# Patient Record
Sex: Male | Born: 1965 | Race: White | Hispanic: No | Smoking: Current every day smoker
Health system: Southern US, Community
[De-identification: ages and names within clinical notes are randomized; demographics above are authoritative.]

## PROBLEM LIST (undated history)

## (undated) DIAGNOSIS — F410 Panic disorder [episodic paroxysmal anxiety] without agoraphobia: Secondary | ICD-10-CM

## (undated) DIAGNOSIS — M5430 Sciatica, unspecified side: Secondary | ICD-10-CM

## (undated) DIAGNOSIS — N400 Enlarged prostate without lower urinary tract symptoms: Secondary | ICD-10-CM

## (undated) DIAGNOSIS — F419 Anxiety disorder, unspecified: Secondary | ICD-10-CM

## (undated) DIAGNOSIS — F32A Depression, unspecified: Secondary | ICD-10-CM

## (undated) DIAGNOSIS — Z8719 Personal history of other diseases of the digestive system: Secondary | ICD-10-CM

## (undated) DIAGNOSIS — F1010 Alcohol abuse, uncomplicated: Secondary | ICD-10-CM

## (undated) DIAGNOSIS — M1990 Unspecified osteoarthritis, unspecified site: Secondary | ICD-10-CM

## (undated) DIAGNOSIS — F329 Major depressive disorder, single episode, unspecified: Secondary | ICD-10-CM

## (undated) DIAGNOSIS — M7711 Lateral epicondylitis, right elbow: Secondary | ICD-10-CM

## (undated) DIAGNOSIS — I10 Essential (primary) hypertension: Secondary | ICD-10-CM

## (undated) DIAGNOSIS — R42 Dizziness and giddiness: Secondary | ICD-10-CM

## (undated) DIAGNOSIS — R569 Unspecified convulsions: Secondary | ICD-10-CM

## (undated) DIAGNOSIS — G8929 Other chronic pain: Secondary | ICD-10-CM

## (undated) DIAGNOSIS — R51 Headache: Secondary | ICD-10-CM

## (undated) DIAGNOSIS — G5601 Carpal tunnel syndrome, right upper limb: Secondary | ICD-10-CM

## (undated) DIAGNOSIS — R519 Headache, unspecified: Secondary | ICD-10-CM

## (undated) DIAGNOSIS — M549 Dorsalgia, unspecified: Secondary | ICD-10-CM

## (undated) HISTORY — DX: Personal history of other diseases of the digestive system: Z87.19

## (undated) HISTORY — DX: Carpal tunnel syndrome, right upper limb: G56.01

## (undated) HISTORY — PX: KIDNEY STONE SURGERY: SHX686

## (undated) HISTORY — DX: Benign prostatic hyperplasia without lower urinary tract symptoms: N40.0

## (undated) HISTORY — DX: Depression, unspecified: F32.A

## (undated) HISTORY — DX: Lateral epicondylitis, right elbow: M77.11

## (undated) HISTORY — DX: Unspecified convulsions: R56.9

## (undated) HISTORY — DX: Unspecified osteoarthritis, unspecified site: M19.90

## (undated) HISTORY — DX: Major depressive disorder, single episode, unspecified: F32.9

---

## 2000-04-30 ENCOUNTER — Emergency Department (HOSPITAL_COMMUNITY): Admission: EM | Admit: 2000-04-30 | Discharge: 2000-04-30 | Payer: Self-pay | Admitting: Emergency Medicine

## 2004-09-04 ENCOUNTER — Ambulatory Visit (HOSPITAL_COMMUNITY): Admission: RE | Admit: 2004-09-04 | Discharge: 2004-09-04 | Payer: Self-pay | Admitting: Internal Medicine

## 2008-05-30 ENCOUNTER — Emergency Department (HOSPITAL_COMMUNITY): Admission: EM | Admit: 2008-05-30 | Discharge: 2008-05-30 | Payer: Self-pay | Admitting: Emergency Medicine

## 2014-12-21 ENCOUNTER — Encounter (HOSPITAL_COMMUNITY): Payer: Self-pay | Admitting: Emergency Medicine

## 2014-12-21 ENCOUNTER — Emergency Department (HOSPITAL_COMMUNITY)
Admission: EM | Admit: 2014-12-21 | Discharge: 2014-12-21 | Disposition: A | Discharge status: Home / Self Care | Payer: Commercial Indemnity | Attending: Emergency Medicine | Admitting: Emergency Medicine

## 2014-12-21 DIAGNOSIS — I10 Essential (primary) hypertension: Secondary | ICD-10-CM | POA: Insufficient documentation

## 2014-12-21 DIAGNOSIS — R040 Epistaxis: Secondary | ICD-10-CM | POA: Diagnosis not present

## 2014-12-21 DIAGNOSIS — Z720 Tobacco use: Secondary | ICD-10-CM | POA: Diagnosis not present

## 2014-12-21 HISTORY — DX: Essential (primary) hypertension: I10

## 2014-12-21 MED ORDER — OXYMETAZOLINE HCL 0.05 % NA SOLN
2.0000 | Freq: Once | NASAL | Status: AC
  Administered 2014-12-21: 2 via NASAL

## 2014-12-21 MED ORDER — OXYMETAZOLINE HCL 0.05 % NA SOLN
NASAL | Status: AC
  Filled 2014-12-21: qty 15

## 2014-12-21 NOTE — Discharge Instructions (Signed)
Use afrin if bleeding starts again,  Return if bleeding continues after afrin and pressure.   folllow up with your md next week.

## 2014-12-21 NOTE — ED Provider Notes (Signed)
CSN: [id number]     Arrival date & time 12/21/14  1739 History  This chart was scribed for Benny Lennert, MD by Luisa Dago, ED Scribe. This patient was seen in room [facility]10/[facility]10 and the patient's care was started at 6:00 PM.    Chief Complaint  Patient presents with  . Epistaxis   Patient is a 49 y.o. male presenting with nosebleeds. The history is provided by the patient and medical records. No language interpreter was used.  Epistaxis Location:  R nare Severity:  Mild Duration:  1 hour Timing:  Constant Progression:  Unchanged Chronicity:  New Context: not anticoagulants, not bleeding disorder, not home oxygen, not nose picking and not trauma   Relieved by:  Nothing Worsened by:  Nothing tried Ineffective treatments:  Applying pressure Associated symptoms: no congestion, no cough and no headaches   Risk factors: no change in medication, no frequent nosebleeds, no recent chemotherapy, no recent nasal surgery and no sinus problems    HPI Comments: Ricky Werner is a 49 y.o. male with PMhx of HTN listed below presents to the Emergency Department complaining of sudden onset of epistaxis that started approximately at [facility]. Pt states that his last nose bleed was 6 years ago. Currently taking 2 types of prescribed medication. Last recorded BP 124/84. Bleeding somewhat controlled. Pt denies fever, neck pain, sore throat, visual disturbance, CP, cough, SOB, abdominal pain, nausea, emesis, diarrhea, urinary symptoms, back pain, HA, weakness, numbness and rash as associated symptoms.    Past Medical History  Diagnosis Date  . Hypertension    History reviewed. No pertinent past surgical history. Family History  Problem Relation Age of Onset  . Hypertension Mother   . Hypertension Brother   . Hypertension Other    History  Substance Use Topics  . Smoking status: Current Every Day Smoker -- 1.50 packs/day    Types: Cigarettes  . Smokeless tobacco: Never Used  . Alcohol Use: Yes      Comment: 1/5 on weekends    Review of Systems  Constitutional: Negative for appetite change and fatigue.  HENT: Positive for nosebleeds. Negative for congestion, ear discharge and sinus pressure.   Eyes: Negative for discharge.  Respiratory: Negative for cough.   Cardiovascular: Negative for chest pain.  Gastrointestinal: Negative for abdominal pain and diarrhea.  Genitourinary: Negative for frequency and hematuria.  Musculoskeletal: Negative for back pain.  Skin: Negative for rash.  Neurological: Negative for seizures and headaches.  Psychiatric/Behavioral: Negative for hallucinations.      Allergies  Review of patient's allergies indicates no known allergies.  Home Medications   Prior to Admission medications   Not on File   BP 138/94 mmHg  Pulse 102  Temp(Src) 98 F (36.7 C) (Oral)  Resp 18  SpO2 95%   Physical Exam  Constitutional: He is oriented to person, place, and time. He appears well-developed.  HENT:  Head: Normocephalic.  Nose: Epistaxis is observed.  Mild bleeding to right nostril.   Eyes: Conjunctivae and EOM are normal. No scleral icterus.  Neck: Neck supple. No thyromegaly present.  Cardiovascular: Normal rate and regular rhythm.  Exam reveals no gallop and no friction rub.   No murmur heard. Pulmonary/Chest: No stridor. He has no wheezes. He has no rales. He exhibits no tenderness.  Abdominal: He exhibits no distension. There is no tenderness. There is no rebound.  Musculoskeletal: Normal range of motion. He exhibits no edema.  Lymphadenopathy:    He has no cervical adenopathy.  Neurological:  He is oriented to person, place, and time. He exhibits normal muscle tone. Coordination normal.  Skin: No rash noted. No erythema.  Psychiatric: He has a normal mood and affect. His behavior is normal.    ED Course  Procedures (including critical care time)  DIAGNOSTIC STUDIES: Oxygen Saturation is 95% on RA, low by my interpretation.    COORDINATION  OF CARE: 6:06 PM- Pt advised of plan for treatment and pt agrees.  Labs Review Labs Reviewed - No data to display  Imaging Review No results found.   EKG Interpretation None      MDM   Final diagnoses:  None       Benny LennertJoseph L Carley Glendenning, MD 12/21/14 1910

## 2014-12-21 NOTE — ED Notes (Signed)
Pt reports sudden onset of epistaxis at approx 1700.

## 2014-12-22 ENCOUNTER — Emergency Department (HOSPITAL_COMMUNITY)
Admission: EM | Admit: 2014-12-22 | Discharge: 2014-12-22 | Disposition: A | Discharge status: Home / Self Care | Payer: Commercial Indemnity | Attending: Emergency Medicine | Admitting: Emergency Medicine

## 2014-12-22 ENCOUNTER — Encounter (HOSPITAL_COMMUNITY): Payer: Self-pay | Admitting: Cardiology

## 2014-12-22 DIAGNOSIS — Z720 Tobacco use: Secondary | ICD-10-CM | POA: Diagnosis not present

## 2014-12-22 DIAGNOSIS — I10 Essential (primary) hypertension: Secondary | ICD-10-CM | POA: Diagnosis not present

## 2014-12-22 DIAGNOSIS — Z79899 Other long term (current) drug therapy: Secondary | ICD-10-CM | POA: Diagnosis not present

## 2014-12-22 DIAGNOSIS — R040 Epistaxis: Secondary | ICD-10-CM | POA: Insufficient documentation

## 2014-12-22 DIAGNOSIS — M549 Dorsalgia, unspecified: Secondary | ICD-10-CM | POA: Insufficient documentation

## 2014-12-22 DIAGNOSIS — G8929 Other chronic pain: Secondary | ICD-10-CM | POA: Diagnosis not present

## 2014-12-22 LAB — CBC WITH DIFFERENTIAL/PLATELET
Basophils Absolute: 0 10*3/uL (ref 0.0–0.1)
Basophils Relative: 0 % (ref 0–1)
Eosinophils Absolute: 0.2 10*3/uL (ref 0.0–0.7)
Eosinophils Relative: 1 % (ref 0–5)
HCT: 46.8 % (ref 39.0–52.0)
Hemoglobin: 15.8 g/dL (ref 13.0–17.0)
Lymphocytes Relative: 29 % (ref 12–46)
Lymphs Abs: 3.1 10*3/uL (ref 0.7–4.0)
MCH: 33 pg (ref 26.0–34.0)
MCHC: 33.8 g/dL (ref 30.0–36.0)
MCV: 97.7 fL (ref 78.0–100.0)
Monocytes Absolute: 1 10*3/uL (ref 0.1–1.0)
Monocytes Relative: 10 % (ref 3–12)
Neutro Abs: 6.5 10*3/uL (ref 1.7–7.7)
Neutrophils Relative %: 60 % (ref 43–77)
Platelets: 286 10*3/uL (ref 150–400)
RBC: 4.79 MIL/uL (ref 4.22–5.81)
RDW: 12.8 % (ref 11.5–15.5)
WBC: 10.8 10*3/uL — ABNORMAL HIGH (ref 4.0–10.5)

## 2014-12-22 MED ORDER — LIDOCAINE-EPINEPHRINE (PF) 1 %-1:200000 IJ SOLN
10.0000 mL | Freq: Once | INTRAMUSCULAR | Status: AC
  Administered 2014-12-22: 10 mL
  Filled 2014-12-22: qty 10

## 2014-12-22 MED ORDER — OXYCODONE-ACETAMINOPHEN 5-325 MG PO TABS
2.0000 | ORAL_TABLET | Freq: Once | ORAL | Status: AC
  Administered 2014-12-22: 2 via ORAL
  Filled 2014-12-22: qty 2

## 2014-12-22 MED ORDER — STERILE WATER FOR INJECTION IJ SOLN
INTRAMUSCULAR | Status: AC
  Filled 2014-12-22: qty 10

## 2014-12-22 NOTE — Discharge Instructions (Signed)
Nosebleed °A nosebleed can be caused by many things, including: °· Getting hit hard in the nose. °· Infections. °· Dry nose. °· Colds. °· Medicines. °Your doctor may do lab testing if you get nosebleeds a lot and the cause is not known. °HOME CARE  °· If your nose was packed with material, keep it there until your doctor takes it out. Put the pack back in your nose if the pack falls out. °· Do not blow your nose for 12 hours after the nosebleed. °· Sit up and bend forward if your nose starts bleeding again. Pinch the front half of your nose nonstop for 20 minutes. °· Put petroleum jelly inside your nose every morning if you have a dry nose. °· Use a humidifier to make the air less dry. °· Do not take aspirin. °· Try not to strain, lift, or bend at the waist for many days after the nosebleed. °GET HELP RIGHT AWAY IF:  °· Nosebleeds keep happening and are hard to stop or control. °· You have bleeding or bruises that are not normal on other parts of the body. °· You have a fever. °· The nosebleeds get worse. °· You get lightheaded, feel faint, sweaty, or throw up (vomit) blood. °MAKE SURE YOU:  °· Understand these instructions. °· Will watch your condition. °· Will get help right away if you are not doing well or get worse. °Document Released: 08/21/2008 Document Revised: 02/04/2012 Document Reviewed: 08/21/2008 °[employer]® Patient Information ©2015 [employer]. This information is not intended to replace advice given to you by your health care provider. Make sure you discuss any questions you have with your health care provider. ° °

## 2014-12-22 NOTE — ED Notes (Signed)
Patient and visitor upset over not receiving prescription for pain medication.

## 2014-12-22 NOTE — ED Notes (Signed)
Nosebleed times 1 hour.  Seen here for same last night.

## 2014-12-22 NOTE — ED Provider Notes (Signed)
CSN: [id number]     Arrival date & time 12/22/14  1556 History   First MD Initiated Contact with Patient 12/22/14 1611     Chief Complaint  Patient presents with  . Epistaxis   Patient is a 49 y.o. male presenting with nosebleeds. The history is provided by the patient.  Epistaxis Location:  R nare Severity:  Moderate Duration:  1 hour Timing:  Constant Progression:  Worsening Chronicity:  Recurrent Relieved by: pressure. Worsened by:  Nothing tried Associated symptoms: no fever   Associated symptoms comment:  Chronic back pain  pt reports he had nosebleed yesterday, he was seen in ED and it responded to nasal spray The bleeding restarted today No trauma He reports coughing up blood He does not take anticoagulants   Past Medical History  Diagnosis Date  . Hypertension    History reviewed. No pertinent past surgical history. Family History  Problem Relation Age of Onset  . Hypertension Mother   . Hypertension Brother   . Hypertension Other    History  Substance Use Topics  . Smoking status: Current Every Day Smoker -- 1.50 packs/day    Types: Cigarettes  . Smokeless tobacco: Never Used  . Alcohol Use: Yes     Comment: 1/5 on weekends    Review of Systems  Constitutional: Negative for fever.  HENT: Positive for nosebleeds.   Gastrointestinal: Negative for vomiting.  Musculoskeletal: Positive for back pain.       Chronic back pain   Neurological: Negative for weakness.  All other systems reviewed and are negative.     Allergies  Review of patient's allergies indicates no known allergies.  Home Medications   Prior to Admission medications   Medication Sig Start Date End Date Taking? Authorizing Provider  ALPRAZolam Prudy Feeler(XANAX) 0.5 MG tablet Take 0.5 mg by mouth 3 (three) times daily as needed. anxiety 12/18/14   Historical Provider, MD  amLODipine (NORVASC) 10 MG tablet Take 10 mg by mouth daily. 12/01/14   Historical Provider, MD  lisinopril  (PRINIVIL,ZESTRIL) 20 MG tablet Take 20 mg by mouth 2 (two) times daily. 12/01/14   Historical Provider, MD  tamsulosin (FLOMAX) 0.4 MG CAPS capsule Take 0.4 mg by mouth daily. 12/01/14   Historical Provider, MD   BP 149/91 mmHg  Pulse 92  Temp(Src) 98.4 F (36.9 C) (Oral)  Resp 18  Ht 5\' 6"  (1.676 m)  Wt 155 lb (70.308 kg)  BMI 25.03 kg/m2  SpO2 95% Physical Exam CONSTITUTIONAL: Well developed/well nourished HEAD: Normocephalic/atraumatic EYES: EOMI/PERRL ENMT: Mucous membranes moist, dried blood at right nare.  Blood noted in oropharynx NECK: supple no meningeal signs CV: S1/S2 noted, no murmurs/rubs/gallops noted LUNGS: Lungs are clear to auscultation bilaterally, no apparent distress ABDOMEN: soft, nontender, no rebound or guarding, bowel sounds noted throughout abdomen NEURO: Pt is awake/alert/appropriate, moves all extremitiesx4.  No facial droop.   EXTREMITIES: pulses normal/equal, full ROM SKIN: warm, color normal PSYCH: no abnormalities of mood noted, alert and oriented to situation  ED Course  EPISTAXIS MANAGEMENT Date/Time: 12/22/2014 5:22 PM Performed by: Joya GaskinsWICKLINE, Zamaya Rapaport W Authorized by: Joya GaskinsWICKLINE, Ayanah Snader W Consent: Verbal consent obtained. Risks and benefits: risks, benefits and alternatives were discussed Consent given by: patient Patient identity confirmed: verbally with patient Anesthesia: local infiltration Local anesthetic: lidocaine 1% with epinephrine Patient sedated: no Treatment site: right anterior and right posterior Post-procedure assessment: bleeding stopped Treatment complexity: complex Patient tolerance: Patient tolerated the procedure well with no immediate complications   Labs Review Labs Reviewed  CBC WITH DIFFERENTIAL/PLATELET - Abnormal; Notable for the following:    WBC 10.8 (*)    All other components within normal limits   5:45 PM Pt tolerating rhino rocket well He is in no distress and no active bleeding Discussed need to have  re-examination/removal of rocket within 48-72hrs  MDM   Final diagnoses:  Epistaxis, recurrent    Nursing notes including past medical history and social history reviewed and considered in documentation Labs/vital reviewed myself and considered during evaluation Previous records reviewed and considered Narcotic database reviewed and considered in decision making     Joya Gaskins, MD 12/22/14 1745

## 2014-12-24 ENCOUNTER — Encounter (HOSPITAL_COMMUNITY): Payer: Self-pay

## 2014-12-24 ENCOUNTER — Emergency Department (HOSPITAL_COMMUNITY)
Admission: EM | Admit: 2014-12-24 | Discharge: 2014-12-24 | Disposition: A | Discharge status: Home / Self Care | Payer: PRIVATE HEALTH INSURANCE | Attending: Emergency Medicine | Admitting: Emergency Medicine

## 2014-12-24 DIAGNOSIS — Z720 Tobacco use: Secondary | ICD-10-CM | POA: Insufficient documentation

## 2014-12-24 DIAGNOSIS — Z4800 Encounter for change or removal of nonsurgical wound dressing: Secondary | ICD-10-CM | POA: Diagnosis not present

## 2014-12-24 DIAGNOSIS — I10 Essential (primary) hypertension: Secondary | ICD-10-CM | POA: Diagnosis not present

## 2014-12-24 DIAGNOSIS — Z79899 Other long term (current) drug therapy: Secondary | ICD-10-CM | POA: Insufficient documentation

## 2014-12-24 MED ORDER — BACITRACIN ZINC 500 UNIT/GM EX OINT
TOPICAL_OINTMENT | CUTANEOUS | Status: AC
  Filled 2014-12-24: qty 0.9

## 2014-12-24 MED ORDER — MUPIROCIN CALCIUM 2 % NA OINT: TOPICAL_OINTMENT | NASAL | Status: DC

## 2014-12-24 NOTE — ED Notes (Signed)
Pt reports has had nasal packing in for 2 days and is here for removal.  Says r side of face and head have been throbbing.

## 2014-12-24 NOTE — ED Provider Notes (Signed)
Pt here for packing removal - had rhino rocket placed 2 days ago - bleeding has stopped - pt has no c/o other than discomfort in the nose - removed by Ms Damian LeavellNeese - I was present for removal - on inspection has spot bleeding on lateral right nare, no hemorrhage, no blood in OP, stable appearing, observe and cauterize as needed.  Medical screening examination/treatment/procedure(s) were conducted as a shared visit with non-physician practitioner(s) and myself.  I personally evaluated the patient during the encounter.  Clinical Impression:   Final diagnoses:  Encounter for removal of nasal packing         Vida RollerBrian D Brieann Osinski, MD 12/24/14 1558

## 2014-12-24 NOTE — ED Provider Notes (Signed)
CSN: [id number]     Arrival date & time 12/24/14  [id number] History   First MD Initiated Contact with Patient 12/24/14 [phone]     Chief Complaint  Patient presents with  . remove nasal packing      (Consider location/radiation/quality/duration/timing/severity/associated sxs/prior Treatment) HPI Ricky Werner is a 49 y.o. male who presents to the ED for removal of nasal packing. He was evaluated here 12/22/14. Patient reports no problems since Rhino Rocket inserted other than he has not been able to sleep with it in his nose due to a throbbing sensation on the right side of his face.   Past Medical History  Diagnosis Date  . Hypertension    History reviewed. No pertinent past surgical history. Family History  Problem Relation Age of Onset  . Hypertension Mother   . Hypertension Brother   . Hypertension Other    History  Substance Use Topics  . Smoking status: Current Every Day Smoker -- 1.50 packs/day    Types: Cigarettes  . Smokeless tobacco: Never Used  . Alcohol Use: Yes     Comment: 1/5 on weekends    Review of Systems Negative except as stated in HPI   Allergies  Review of patient's allergies indicates no known allergies.  Home Medications   Prior to Admission medications   Medication Sig Start Date End Date Taking? Authorizing Provider  ALPRAZolam Prudy Feeler(XANAX) 0.5 MG tablet Take 0.5 mg by mouth 3 (three) times daily as needed. anxiety 12/18/14  Yes Historical Provider, MD  amLODipine (NORVASC) 10 MG tablet Take 10 mg by mouth daily. 12/01/14  Yes Historical Provider, MD  lisinopril (PRINIVIL,ZESTRIL) 20 MG tablet Take 20 mg by mouth 2 (two) times daily. 12/01/14  Yes Historical Provider, MD  oxyCODONE-acetaminophen (PERCOCET) 10-325 MG per tablet Take 1 tablet by mouth every 4 (four) hours as needed for pain.   Yes Historical Provider, MD  tamsulosin (FLOMAX) 0.4 MG CAPS capsule Take 0.4 mg by mouth daily. 12/01/14  Yes Historical Provider, MD  mupirocin nasal ointment (BACTROBAN)  2 % Apply in right nostril daily 12/24/14   Karina Nofsinger Orlene OchM Deona Novitski, NP   BP 120/84 mmHg  Pulse 93  Temp(Src) 97.8 F (36.6 C) (Oral)  Resp 18  Ht 5\' 6"  (1.676 m)  Wt 150 lb (68.04 kg)  BMI 24.22 kg/m2  SpO2 99% Physical Exam  Constitutional: He is oriented to person, place, and time. He appears well-developed and well-nourished. No distress.  HENT:  Nose: Mucosal edema present.  Rhino rocket in place  Eyes: Conjunctivae and EOM are normal.  Neck: Neck supple.  Cardiovascular: Normal rate.   Pulmonary/Chest: Effort normal.  Musculoskeletal: Normal range of motion.  Neurological: He is alert and oriented to person, place, and time. No cranial nerve deficit.  Skin: Skin is warm and dry.  Psychiatric: He has a normal mood and affect. His behavior is normal.  Nursing note and vitals reviewed.   ED Course  Procedures (including critical care time) Labs Review Removal of Rhino Rocket Using a 10 cc syringe air removed from balloon and Rhino Rocket removed without difficulty.  Exam after removal shows some edema of the right nostril and dried blood. There is erythema of the nasal mucosa.  Will observe to be sure no further bleeding.  MDM  No further bleeding while in the ED. 49 y.o. male stable for d/c after removal of nasal packing without further bleeding. Discussed with the patient plan of care and all questioned fully answered. He will return  if any problems arise.  Final diagnoses:  Encounter for removal of nasal packing      Janne Napoleon, NP 12/24/14 1720  Vida Roller, MD 12/25/14 [phone]

## 2015-03-21 ENCOUNTER — Encounter (INDEPENDENT_AMBULATORY_CARE_PROVIDER_SITE_OTHER): Payer: Self-pay | Admitting: *Deleted

## 2017-04-23 ENCOUNTER — Encounter (HOSPITAL_COMMUNITY): Payer: Self-pay | Admitting: Emergency Medicine

## 2017-04-23 ENCOUNTER — Emergency Department (HOSPITAL_COMMUNITY)
Admission: EM | Admit: 2017-04-23 | Discharge: 2017-04-23 | Disposition: A | Discharge status: Home / Self Care | Payer: 59 | Attending: Emergency Medicine | Admitting: Emergency Medicine

## 2017-04-23 ENCOUNTER — Emergency Department (HOSPITAL_COMMUNITY): Payer: 59

## 2017-04-23 DIAGNOSIS — R0789 Other chest pain: Secondary | ICD-10-CM | POA: Diagnosis present

## 2017-04-23 DIAGNOSIS — F17210 Nicotine dependence, cigarettes, uncomplicated: Secondary | ICD-10-CM | POA: Diagnosis not present

## 2017-04-23 DIAGNOSIS — I10 Essential (primary) hypertension: Secondary | ICD-10-CM | POA: Insufficient documentation

## 2017-04-23 DIAGNOSIS — Z79899 Other long term (current) drug therapy: Secondary | ICD-10-CM | POA: Diagnosis not present

## 2017-04-23 DIAGNOSIS — R079 Chest pain, unspecified: Secondary | ICD-10-CM

## 2017-04-23 HISTORY — DX: Sciatica, unspecified side: M54.30

## 2017-04-23 LAB — BASIC METABOLIC PANEL
Anion gap: 6 (ref 5–15)
BUN: 10 mg/dL (ref 6–20)
CO2: 30 mmol/L (ref 22–32)
Calcium: 9.6 mg/dL (ref 8.9–10.3)
Chloride: 104 mmol/L (ref 101–111)
Creatinine, Ser: 0.78 mg/dL (ref 0.61–1.24)
GFR calc Af Amer: >60 mL/min (ref >60)
GFR calc non Af Amer: >60 mL/min (ref >60)
Glucose, Bld: 114 mg/dL — ABNORMAL HIGH (ref 65–99)
Potassium: 4.1 mmol/L (ref 3.5–5.1)
Sodium: 140 mmol/L (ref 135–145)

## 2017-04-23 LAB — CBC
HCT: 45.9 % (ref 39.0–52.0)
Hemoglobin: 15.9 g/dL (ref 13.0–17.0)
MCH: 34 pg (ref 26.0–34.0)
MCHC: 34.6 g/dL (ref 30.0–36.0)
MCV: 98.3 fL (ref 78.0–100.0)
Platelets: 226 10*3/uL (ref 150–400)
RBC: 4.67 MIL/uL (ref 4.22–5.81)
RDW: 12.4 % (ref 11.5–15.5)
WBC: 10 10*3/uL (ref 4.0–10.5)

## 2017-04-23 LAB — TROPONIN I: Troponin I: <0.03 ng/mL (ref <0.03)

## 2017-04-23 NOTE — Discharge Instructions (Signed)
Follow-up with your PCP and discuss stress testing °

## 2017-04-23 NOTE — ED Provider Notes (Signed)
[facility]-EMERGENCY DEPT Provider Note   CSN: [id number] Arrival date & time: 04/23/17  [id number]  By signing my name below, I, Thelma Bargeick Cochran, attest that this documentation has been prepared under the direction and in the presence of Raeford RazorKohut, Bhavya Grand, MD. Electronically Signed: Thelma BargeNick Cochran, Scribe. 04/23/17. 12:54 PM.  History   Chief Complaint Chief Complaint  Patient presents with  . Chest Pain   The history is provided by the patient. No language interpreter was used.    HPI Comments: Ricky Werner is a 51 y.o. male with a PMHx of HTN who presents to the Emergency Department complaining of waxing/waning, rapid-onset dull CP that began last night. He states this morning the pain worsened and is also worsened by walking around. He notes there is no change in pain when he is lying flat or when sitting up. He describes it as if something is "pulling" on his chest. He denies SOB, nausea, vomiting, diaphoresis, no back pain beyond baseline.   Past Medical History:  Diagnosis Date  . Hypertension   . Sciatica     There are no active problems to display for this patient.   History reviewed. No pertinent surgical history.     Home Medications    Prior to Admission medications   Medication Sig Start Date End Date Taking? Authorizing Provider  ALPRAZolam Prudy Feeler(XANAX) 0.5 MG tablet Take 0.5 mg by mouth 3 (three) times daily as needed. anxiety 12/18/14   [provider]  amLODipine (NORVASC) 10 MG tablet Take 10 mg by mouth daily. 12/01/14   [provider]  lisinopril (PRINIVIL,ZESTRIL) 20 MG tablet Take 20 mg by mouth 2 (two) times daily. 12/01/14   [provider]  mupirocin nasal ointment (BACTROBAN) 2 % Apply in right nostril daily 12/24/14   Janne NapoleonNeese, Hope M, NP  oxyCODONE-acetaminophen (PERCOCET) 10-325 MG per tablet Take 1 tablet by mouth every 4 (four) hours as needed for pain.    [provider]  tamsulosin (FLOMAX) 0.4 MG CAPS capsule Take 0.4 mg by mouth  daily. 12/01/14   [provider]    Family History Family History  Problem Relation Age of Onset  . Hypertension Mother   . Hypertension Brother   . Hypertension Other     Social History Social History  Substance Use Topics  . Smoking status: Current Every Day Smoker    Packs/day: 1.50    Types: Cigarettes  . Smokeless tobacco: Never Used  . Alcohol use Yes     Comment: "1 pint of liquor every day"     Allergies   Patient has no known allergies.   Review of Systems Review of Systems  Constitutional: Negative for diaphoresis.  Respiratory: Negative for shortness of breath.   Cardiovascular: Positive for chest pain.  Gastrointestinal: Negative for nausea and vomiting.  Musculoskeletal: Negative for back pain.  All other systems reviewed and are negative.    Physical Exam Updated Vital Signs BP (!) 157/111   Pulse 75   Temp 97.8 F (36.6 C) (Oral)   Resp 20   Ht 5\' 6"  (1.676 m)   Wt 155 lb (70.3 kg)   SpO2 100%   BMI 25.02 kg/m   Physical Exam  Constitutional: He is oriented to person, place, and time. He appears well-developed and well-nourished. No distress.  HENT:  Head: Normocephalic.  Eyes: Conjunctivae are normal. Pupils are equal, round, and reactive to light. No scleral icterus.  Neck: Normal range of motion. Neck supple. No thyromegaly present.  Cardiovascular:  Normal rate and regular rhythm.  Exam reveals no gallop and no friction rub.   No murmur heard. Pulmonary/Chest: Effort normal and breath sounds normal. No respiratory distress. He has no wheezes. He has no rales.  Abdominal: Soft. Bowel sounds are normal. He exhibits no distension. There is no tenderness. There is no rebound.  Musculoskeletal: Normal range of motion.  Neurological: He is alert and oriented to person, place, and time.  Skin: Skin is warm and dry. No rash noted.  Psychiatric: He has a normal mood and affect. His behavior is normal.     ED Treatments / Results    DIAGNOSTIC STUDIES: Oxygen Saturation is 100% on RA, normal by my interpretation.    COORDINATION OF CARE: 12:51 PM Discussed treatment plan with pt at bedside and pt agreed to plan.  Labs (all labs ordered are listed, but only abnormal results are displayed) Labs Reviewed  BASIC METABOLIC PANEL - Abnormal; Notable for the following:       Result Value   Glucose, Bld 114 (*)    All other components within normal limits  CBC  TROPONIN I    EKG  EKG Interpretation  Date/Time:  Tuesday Apr 23 2017 10:01:29 EDT Ventricular Rate:  73 PR Interval:  154 QRS Duration: 96 QT Interval:  406 QTC Calculation: 447 R Axis:   74 Text Interpretation:  Normal sinus rhythm with sinus arrhythmia Normal ECG Confirmed by Juleen China  MD, Deforest Maiden [phone]) on 04/23/2017 12:41:00 PM       Radiology Dg Chest 2 View  Result Date: 04/23/2017 CLINICAL DATA:  Chest pain since yesterday. Chronic shortness of breath. Smoker. EXAM: CHEST  2 VIEW COMPARISON:  None. FINDINGS: The cardiomediastinal silhouette is within normal limits. The lungs are well inflated with slight peribronchial thickening and interstitial coarsening. No confluent airspace opacity, overt edema, pleural effusion, or pneumothorax is identified. No acute osseous abnormality is seen. IMPRESSION: Slight bronchitic changes. Electronically Signed   By: Sebastian Ache M.D.   On: 04/23/2017 10:20    Procedures Procedures (including critical care time)  Medications Ordered in ED Medications - No data to display   Initial Impression / Assessment and Plan / ED Course  I have reviewed the triage vital signs and the nursing notes.  Pertinent labs & imaging results that were available during my care of the patient were reviewed by me and considered in my medical decision making (see chart for details).   51 year old male with chest pain. Atypical for ACS and that's been constant since yesterday. His physical exams unremarkable. He is hypertensive,  otherwise he is hemodynamically stable. Is not tachycardic or hypoxic. I doubt PE. Troponin is normal with constant symptoms since yesterday. EKG is without overt ischemic changes. Chest x-ray with some possible bronchitic changes but clinically has symptoms do not suggest acute bronchitis.    I doubt an emergent condition. Return precautions were discussed with the patient though. I recommended that he follow-up with his PCP and schedule stress testing.  Final Clinical Impressions(s) / ED Diagnoses   Final diagnoses:  Chest pain, unspecified type    New Prescriptions New Prescriptions   No medications on file   I personally preformed the services scribed in my presence. The recorded information has been reviewed is accurate. Raeford Razor, MD.     Raeford Razor, MD 05/06/17 [phone]

## 2017-04-23 NOTE — ED Triage Notes (Addendum)
Patient complaining of chest pain starting yesterday. Patient states "I had to change jobs and insurance so I've cut back on my B/P medicines."

## 2017-09-20 ENCOUNTER — Emergency Department (HOSPITAL_COMMUNITY): Payer: Self-pay

## 2017-09-20 ENCOUNTER — Encounter (HOSPITAL_COMMUNITY): Payer: Self-pay | Admitting: Emergency Medicine

## 2017-09-20 ENCOUNTER — Inpatient Hospital Stay (HOSPITAL_COMMUNITY)
Admission: EM | Admit: 2017-09-20 | Discharge: 2017-09-22 | DRG: 896 | Disposition: A | Discharge status: Home / Self Care | Payer: Self-pay | Attending: Family Medicine | Admitting: Family Medicine

## 2017-09-20 DIAGNOSIS — G40509 Epileptic seizures related to external causes, not intractable, without status epilepticus: Secondary | ICD-10-CM | POA: Diagnosis present

## 2017-09-20 DIAGNOSIS — Z7982 Long term (current) use of aspirin: Secondary | ICD-10-CM

## 2017-09-20 DIAGNOSIS — R17 Unspecified jaundice: Secondary | ICD-10-CM | POA: Diagnosis present

## 2017-09-20 DIAGNOSIS — F1210 Cannabis abuse, uncomplicated: Secondary | ICD-10-CM | POA: Diagnosis present

## 2017-09-20 DIAGNOSIS — F10939 Alcohol use, unspecified with withdrawal, unspecified: Secondary | ICD-10-CM

## 2017-09-20 DIAGNOSIS — M5440 Lumbago with sciatica, unspecified side: Secondary | ICD-10-CM | POA: Diagnosis present

## 2017-09-20 DIAGNOSIS — M549 Dorsalgia, unspecified: Secondary | ICD-10-CM | POA: Diagnosis present

## 2017-09-20 DIAGNOSIS — F1320 Sedative, hypnotic or anxiolytic dependence, uncomplicated: Secondary | ICD-10-CM | POA: Diagnosis present

## 2017-09-20 DIAGNOSIS — K8020 Calculus of gallbladder without cholecystitis without obstruction: Secondary | ICD-10-CM | POA: Diagnosis present

## 2017-09-20 DIAGNOSIS — Z79899 Other long term (current) drug therapy: Secondary | ICD-10-CM

## 2017-09-20 DIAGNOSIS — F17210 Nicotine dependence, cigarettes, uncomplicated: Secondary | ICD-10-CM | POA: Diagnosis present

## 2017-09-20 DIAGNOSIS — F1010 Alcohol abuse, uncomplicated: Secondary | ICD-10-CM | POA: Diagnosis present

## 2017-09-20 DIAGNOSIS — F10239 Alcohol dependence with withdrawal, unspecified: Principal | ICD-10-CM | POA: Diagnosis present

## 2017-09-20 DIAGNOSIS — K700 Alcoholic fatty liver: Secondary | ICD-10-CM | POA: Diagnosis present

## 2017-09-20 DIAGNOSIS — K760 Fatty (change of) liver, not elsewhere classified: Secondary | ICD-10-CM | POA: Diagnosis present

## 2017-09-20 DIAGNOSIS — E43 Unspecified severe protein-calorie malnutrition: Secondary | ICD-10-CM | POA: Diagnosis present

## 2017-09-20 DIAGNOSIS — Z6824 Body mass index (BMI) 24.0-24.9, adult: Secondary | ICD-10-CM

## 2017-09-20 DIAGNOSIS — M5430 Sciatica, unspecified side: Secondary | ICD-10-CM | POA: Diagnosis present

## 2017-09-20 DIAGNOSIS — J069 Acute upper respiratory infection, unspecified: Secondary | ICD-10-CM | POA: Diagnosis present

## 2017-09-20 DIAGNOSIS — I10 Essential (primary) hypertension: Secondary | ICD-10-CM | POA: Diagnosis present

## 2017-09-20 DIAGNOSIS — R748 Abnormal levels of other serum enzymes: Secondary | ICD-10-CM | POA: Diagnosis present

## 2017-09-20 DIAGNOSIS — R569 Unspecified convulsions: Secondary | ICD-10-CM

## 2017-09-20 DIAGNOSIS — G8929 Other chronic pain: Secondary | ICD-10-CM | POA: Diagnosis present

## 2017-09-20 DIAGNOSIS — Z720 Tobacco use: Secondary | ICD-10-CM | POA: Diagnosis present

## 2017-09-20 HISTORY — DX: Alcohol abuse, uncomplicated: F10.10

## 2017-09-20 HISTORY — DX: Other chronic pain: G89.29

## 2017-09-20 HISTORY — DX: Dorsalgia, unspecified: M54.9

## 2017-09-20 LAB — CBC WITH DIFFERENTIAL/PLATELET
BASOS PCT: 0 %
Basophils Absolute: 0 10*3/uL (ref 0.0–0.1)
EOS ABS: 0 10*3/uL (ref 0.0–0.7)
EOS PCT: 0 %
HCT: 48.1 % (ref 39.0–52.0)
Hemoglobin: 16.8 g/dL (ref 13.0–17.0)
LYMPHS ABS: 2.1 10*3/uL (ref 0.7–4.0)
Lymphocytes Relative: 14 %
MCH: 34.4 pg — AB (ref 26.0–34.0)
MCHC: 34.9 g/dL (ref 30.0–36.0)
MCV: 98.6 fL (ref 78.0–100.0)
MONOS PCT: 7 %
Monocytes Absolute: 1.1 10*3/uL — ABNORMAL HIGH (ref 0.1–1.0)
NEUTROS PCT: 79 %
Neutro Abs: 11.7 10*3/uL — ABNORMAL HIGH (ref 1.7–7.7)
PLATELETS: 234 10*3/uL (ref 150–400)
RBC: 4.88 MIL/uL (ref 4.22–5.81)
RDW: 12.5 % (ref 11.5–15.5)
WBC: 14.9 10*3/uL — AB (ref 4.0–10.5)

## 2017-09-20 LAB — COMPREHENSIVE METABOLIC PANEL
ALBUMIN: 4.3 g/dL (ref 3.5–5.0)
ALK PHOS: 46 U/L (ref 38–126)
ALT: 203 U/L — ABNORMAL HIGH (ref 17–63)
ANION GAP: 11 (ref 5–15)
AST: 203 U/L — ABNORMAL HIGH (ref 15–41)
BILIRUBIN TOTAL: 1.8 mg/dL — AB (ref 0.3–1.2)
BUN: 18 mg/dL (ref 6–20)
CALCIUM: 9.7 mg/dL (ref 8.9–10.3)
CO2: 26 mmol/L (ref 22–32)
CREATININE: 0.81 mg/dL (ref 0.61–1.24)
Chloride: 102 mmol/L (ref 101–111)
GFR calc non Af Amer: >60 mL/min (ref >60)
GLUCOSE: 140 mg/dL — AB (ref 65–99)
Potassium: 3.8 mmol/L (ref 3.5–5.1)
SODIUM: 139 mmol/L (ref 135–145)
TOTAL PROTEIN: 8.3 g/dL — AB (ref 6.5–8.1)

## 2017-09-20 LAB — URINALYSIS, ROUTINE W REFLEX MICROSCOPIC
BILIRUBIN URINE: NEGATIVE
GLUCOSE, UA: NEGATIVE mg/dL
Ketones, ur: NEGATIVE mg/dL
LEUKOCYTES UA: NEGATIVE
NITRITE: NEGATIVE
PH: 5 (ref 5.0–8.0)
Protein, ur: 100 mg/dL — AB
SPECIFIC GRAVITY, URINE: 1.019 (ref 1.005–1.030)

## 2017-09-20 LAB — RAPID URINE DRUG SCREEN, HOSP PERFORMED
Amphetamines: NOT DETECTED
Barbiturates: NOT DETECTED
Benzodiazepines: POSITIVE — AB
COCAINE: NOT DETECTED
OPIATES: NOT DETECTED
Tetrahydrocannabinol: POSITIVE — AB

## 2017-09-20 LAB — CBG MONITORING, ED: GLUCOSE-CAPILLARY: 193 mg/dL — AB (ref 65–99)

## 2017-09-20 LAB — MRSA PCR SCREENING: MRSA by PCR: NEGATIVE

## 2017-09-20 LAB — ETHANOL: Alcohol, Ethyl (B): <10 mg/dL (ref <10)

## 2017-09-20 LAB — TROPONIN I: Troponin I: <0.03 ng/mL (ref <0.03)

## 2017-09-20 LAB — AMMONIA: Ammonia: 25 umol/L (ref 9–35)

## 2017-09-20 MED ORDER — FOLIC ACID 1 MG PO TABS
1.0000 mg | ORAL_TABLET | Freq: Every day | ORAL | Status: DC
  Administered 2017-09-20 – 2017-09-22 (×3): 1 mg via ORAL
  Filled 2017-09-20 (×3): qty 1

## 2017-09-20 MED ORDER — CLEAR EYES COMPLETE OP SOLN: 1.0000 [drp] | Freq: Every day | OPHTHALMIC | Status: DC

## 2017-09-20 MED ORDER — THIAMINE HCL 100 MG/ML IJ SOLN
Freq: Once | INTRAVENOUS | Status: AC
  Administered 2017-09-20: 17:00:00 via INTRAVENOUS
  Filled 2017-09-20: qty 1000

## 2017-09-20 MED ORDER — ADULT MULTIVITAMIN W/MINERALS CH
1.0000 | ORAL_TABLET | Freq: Every day | ORAL | Status: DC
  Administered 2017-09-20 – 2017-09-22 (×3): 1 via ORAL
  Filled 2017-09-20 (×3): qty 1

## 2017-09-20 MED ORDER — LORAZEPAM 1 MG PO TABS: 0.0000 mg | ORAL_TABLET | Freq: Four times a day (QID) | ORAL | Status: DC

## 2017-09-20 MED ORDER — AMLODIPINE BESYLATE 5 MG PO TABS
10.0000 mg | ORAL_TABLET | Freq: Every day | ORAL | Status: DC
  Administered 2017-09-20 – 2017-09-22 (×3): 10 mg via ORAL
  Filled 2017-09-20 (×3): qty 2

## 2017-09-20 MED ORDER — ASPIRIN EC 81 MG PO TBEC
81.0000 mg | DELAYED_RELEASE_TABLET | Freq: Every day | ORAL | Status: DC
  Administered 2017-09-20 – 2017-09-22 (×3): 81 mg via ORAL
  Filled 2017-09-20 (×3): qty 1

## 2017-09-20 MED ORDER — DOCUSATE SODIUM 100 MG PO CAPS: 100.0000 mg | ORAL_CAPSULE | Freq: Two times a day (BID) | ORAL | Status: DC | PRN

## 2017-09-20 MED ORDER — ENSURE ENLIVE PO LIQD
237.0000 mL | Freq: Two times a day (BID) | ORAL | Status: DC
  Administered 2017-09-21 – 2017-09-22 (×2): 237 mL via ORAL

## 2017-09-20 MED ORDER — LORAZEPAM 2 MG/ML IJ SOLN: 0.0000 mg | Freq: Two times a day (BID) | INTRAMUSCULAR | Status: DC

## 2017-09-20 MED ORDER — VITAMIN B-1 100 MG PO TABS
100.0000 mg | ORAL_TABLET | Freq: Every day | ORAL | Status: DC
  Administered 2017-09-20 – 2017-09-22 (×4): 100 mg via ORAL
  Filled 2017-09-20 (×3): qty 1

## 2017-09-20 MED ORDER — LORAZEPAM 1 MG PO TABS: 0.0000 mg | ORAL_TABLET | Freq: Two times a day (BID) | ORAL | Status: DC

## 2017-09-20 MED ORDER — ENOXAPARIN SODIUM 40 MG/0.4ML ~~LOC~~ SOLN
40.0000 mg | SUBCUTANEOUS | Status: DC
  Administered 2017-09-20 – 2017-09-21 (×2): 40 mg via SUBCUTANEOUS
  Filled 2017-09-20 (×2): qty 0.4

## 2017-09-20 MED ORDER — LORAZEPAM 2 MG/ML IJ SOLN
2.0000 mg | INTRAMUSCULAR | Status: DC | PRN
  Administered 2017-09-20 – 2017-09-21 (×3): 2 mg via INTRAVENOUS
  Filled 2017-09-20 (×4): qty 1

## 2017-09-20 MED ORDER — OXYCODONE HCL 5 MG PO TABS
5.0000 mg | ORAL_TABLET | Freq: Four times a day (QID) | ORAL | Status: DC | PRN
  Administered 2017-09-20 – 2017-09-22 (×6): 10 mg via ORAL
  Filled 2017-09-20 (×6): qty 2

## 2017-09-20 MED ORDER — SODIUM CHLORIDE 0.9 % IV BOLUS (SEPSIS)
1000.0000 mL | Freq: Once | INTRAVENOUS | Status: AC
  Administered 2017-09-20: 1000 mL via INTRAVENOUS

## 2017-09-20 MED ORDER — ONDANSETRON HCL 4 MG/2ML IJ SOLN: 4.0000 mg | Freq: Four times a day (QID) | INTRAMUSCULAR | Status: DC | PRN

## 2017-09-20 MED ORDER — ALBUTEROL SULFATE (2.5 MG/3ML) 0.083% IN NEBU: 2.5000 mg | INHALATION_SOLUTION | Freq: Four times a day (QID) | RESPIRATORY_TRACT | Status: DC | PRN

## 2017-09-20 MED ORDER — AMOXICILLIN-POT CLAVULANATE 875-125 MG PO TABS
1.0000 | ORAL_TABLET | Freq: Two times a day (BID) | ORAL | Status: DC
  Administered 2017-09-20 – 2017-09-22 (×5): 1 via ORAL
  Filled 2017-09-20 (×5): qty 1

## 2017-09-20 MED ORDER — POLYVINYL ALCOHOL 1.4 % OP SOLN
1.0000 [drp] | Freq: Every day | OPHTHALMIC | Status: DC
  Filled 2017-09-20: qty 15

## 2017-09-20 MED ORDER — LISINOPRIL 10 MG PO TABS
20.0000 mg | ORAL_TABLET | Freq: Every day | ORAL | Status: DC
  Administered 2017-09-21 – 2017-09-22 (×2): 20 mg via ORAL
  Filled 2017-09-20 (×3): qty 2

## 2017-09-20 MED ORDER — DOCUSATE SODIUM 100 MG PO CAPS: 100.0000 mg | ORAL_CAPSULE | Freq: Two times a day (BID) | ORAL | Status: DC

## 2017-09-20 MED ORDER — LORAZEPAM 2 MG/ML IJ SOLN: 0.0000 mg | Freq: Four times a day (QID) | INTRAMUSCULAR | Status: DC

## 2017-09-20 MED ORDER — VITAMIN B-1 100 MG PO TABS
100.0000 mg | ORAL_TABLET | Freq: Every day | ORAL | Status: DC
  Administered 2017-09-20: 100 mg via ORAL
  Filled 2017-09-20: qty 1

## 2017-09-20 MED ORDER — HYDROCOD POLST-CPM POLST ER 10-8 MG/5ML PO SUER: 5.0000 mL | Freq: Two times a day (BID) | ORAL | Status: DC | PRN

## 2017-09-20 MED ORDER — LORAZEPAM 1 MG PO TABS
1.0000 mg | ORAL_TABLET | Freq: Once | ORAL | Status: AC
  Administered 2017-09-20: 1 mg via ORAL
  Filled 2017-09-20: qty 1

## 2017-09-20 MED ORDER — SODIUM CHLORIDE 0.9 % IV SOLN
INTRAVENOUS | Status: AC
  Administered 2017-09-20 – 2017-09-21 (×2): via INTRAVENOUS

## 2017-09-20 MED ORDER — NICOTINE 21 MG/24HR TD PT24
21.0000 mg | MEDICATED_PATCH | Freq: Every day | TRANSDERMAL | Status: DC
  Administered 2017-09-20 – 2017-09-22 (×3): 21 mg via TRANSDERMAL
  Filled 2017-09-20 (×3): qty 1

## 2017-09-20 MED ORDER — LORAZEPAM 2 MG/ML IJ SOLN
1.0000 mg | Freq: Once | INTRAMUSCULAR | Status: AC
  Administered 2017-09-20: 1 mg via INTRAVENOUS
  Filled 2017-09-20: qty 1

## 2017-09-20 MED ORDER — ONDANSETRON HCL 4 MG PO TABS: 4.0000 mg | ORAL_TABLET | Freq: Four times a day (QID) | ORAL | Status: DC | PRN

## 2017-09-20 MED ORDER — THIAMINE HCL 100 MG/ML IJ SOLN: 100.0000 mg | Freq: Every day | INTRAMUSCULAR | Status: DC

## 2017-09-20 NOTE — H&P (Addendum)
History and Physical  TOUA STITES WUJ:[id number] DOB: Sep 01, 1966 DOA: 09/20/2017  Referring physician: Clarene Duke MD PCP: [facility]   Chief Complaint: Seizure   HPI: HAIK MAHONEY is a 51 y.o. male who is a known heavy alcohol consumer, drinking at least 1-2 pints of liquor per day abruptly stopped drinking alcohol 3 days ago and ended up having a seizure earlier this morning.  He was discovered by his son who heard a large thud from the patient's room and when he arrived he found the patient actively seizing and poorly responsive.  EMS was called.  He was found to be lethargic and postictal after the seizure.  On arrival in the emergency department he was noted to be alert x3.  The patient received Lorazepam.  The patient apparently had been taking alprazolam which he reports that he takes on a regular basis.  Despite this he still had a seizure.  His alcohol level in the serum was undetectable.  He was noted to be complaining of chronic low back pain.  He had some imaging studies for that in the ED.  He was also noted to have elevated liver enzymes and bilirubin.  He did have an abdominal ultrasound that revealed hepatocellular disease and fatty liver infiltration.  There was no acute gallbladder disease noted.  The patient is being admitted for alcohol withdrawal seizures and acute alcohol withdrawal.    Severity of Illness: The appropriate patient status for this patient is INPATIENT. Inpatient status is judged to be reasonable and necessary in order to provide the required intensity of service to ensure the patient's safety. The patient's presenting symptoms, physical exam findings, and initial radiographic and laboratory data in the context of their chronic comorbidities is felt to place them at high risk for further clinical deterioration. Furthermore, it is not anticipated that the patient will be medically stable for discharge from the hospital within 2 midnights of  admission. The following factors support the patient status of inpatient.   " The patient's presenting symptoms include seizure. " The worrisome physical exam findings include post-ictal lethargy. " The initial radiographic and laboratory data are worrisome because of elevated liver enzymes, + UDS. " The chronic co-morbidities include chronic alcoholism.  * I certify that at the point of admission it is my clinical judgment that the patient will require inpatient hospital care spanning beyond 2 midnights from the point of admission due to high intensity of service, high risk for further deterioration and high frequency of surveillance required.*  Review of Systems: All systems reviewed and apart from history of presenting illness, are negative.  Past Medical History:  Diagnosis Date  . Alcohol abuse   . Chronic back pain   . Hypertension   . Sciatica    History reviewed. No pertinent surgical history. Social History:  reports that he has been smoking Cigarettes.  He has been smoking about 1.50 packs per day. He has never used smokeless tobacco. He reports that he drinks alcohol. He reports that he uses drugs, including Marijuana.  No Known Allergies  Family History  Problem Relation Age of Onset  . Hypertension Mother   . Hypertension Brother   . Hypertension Other     Prior to Admission medications   Medication Sig Start Date End Date Taking? Authorizing Provider  ALPRAZolam Prudy Feeler) 1 MG tablet Take 1 mg by mouth 3 (three) times daily as needed for anxiety (Patient states tha typically he only takes 2 a day).  Yes [provider]  amLODipine (NORVASC) 10 MG tablet Take 10 mg by mouth daily. 12/01/14  Yes [provider]  aspirin EC 81 MG tablet Take 81 mg by mouth daily.   Yes [provider]  DiphenhydrAMINE HCl (ALLERGY MEDICATION PO) Take 1 tablet by mouth daily as needed.   Yes [provider]  Hyprom-Naphaz-Polysorb-Zn Sulf (CLEAR EYES  COMPLETE OP) Place 1 drop into both eyes daily.   Yes [provider]  lisinopril (PRINIVIL,ZESTRIL) 20 MG tablet Take 20 mg by mouth 2 (two) times daily. 12/01/14  Yes [provider]  oxyCODONE-acetaminophen (PERCOCET) 10-325 MG per tablet Take 1 tablet by mouth every 4 (four) hours as needed for pain.   Yes [provider]  mupirocin nasal ointment (BACTROBAN) 2 % Apply in right nostril daily Patient not taking: Reported on 09/20/2017 12/24/14   Janne Napoleon, NP   Physical Exam: Vitals:   09/20/17 1008 09/20/17 1030 09/20/17 1100 09/20/17 1128  BP: 128/88 130/88 (!) 147/98 (!) 147/98  Pulse: 98 90 98 98  Resp: 18 18 19    Temp:      TempSrc:      SpO2: 96% 95% 96%      General exam: Moderately built and poorly nourished patient, lying comfortably supine on the gurney in no obvious distress.  Head, eyes and ENT: Nontraumatic and normocephalic. Pupils equally reacting to light and accommodation. Oral mucosa dry.  Neck: Supple. No JVD, carotid bruit or thyromegaly.  Lymphatics: No lymphadenopathy.  Respiratory system: upper airway congestion noises. No increased work of breathing.  Cardiovascular system: S1 and S2 heard, RRR. No JVD, murmurs, gallops, clicks or pedal edema.  Gastrointestinal system: Abdomen is nondistended, soft and nontender. Normal bowel sounds heard. No organomegaly or masses appreciated.  Central nervous system: Alert and oriented. No focal neurological deficits.  Extremities: Symmetric 5 x 5 power. Peripheral pulses symmetrically felt.   Skin: No rashes or acute findings.  Musculoskeletal system: Negative exam.  Psychiatry: Pleasant and cooperative.  Labs on Admission:  Basic Metabolic Panel:  Recent Labs Lab 09/20/17 0815  NA 139  K 3.8  CL 102  CO2 26  GLUCOSE 140*  BUN 18  CREATININE 0.81  CALCIUM 9.7   Liver Function Tests:  Recent Labs Lab 09/20/17 0815  AST 203*  ALT 203*  ALKPHOS 46  BILITOT 1.8*    PROT 8.3*  ALBUMIN 4.3   No results for input(s): LIPASE, AMYLASE in the last 168 hours.  Recent Labs Lab 09/20/17 0929  AMMONIA 25   CBC:  Recent Labs Lab 09/20/17 0815  WBC 14.9*  NEUTROABS 11.7*  HGB 16.8  HCT 48.1  MCV 98.6  PLT 234   Cardiac Enzymes:  Recent Labs Lab 09/20/17 0815  TROPONINI <0.03    BNP (last 3 results) No results for input(s): PROBNP in the last 8760 hours. CBG:  Recent Labs Lab 09/20/17 0758  GLUCAP 193*    Radiological Exams on Admission: Dg Chest 1 View  Result Date: 09/20/2017 CLINICAL DATA:  Seizure, fall EXAM: CHEST 1 VIEW COMPARISON:  04/23/2017 FINDINGS: Heart and mediastinal contours are within normal limits. No focal opacities or effusions. No acute bony abnormality. IMPRESSION: No active disease. Electronically Signed   By: Charlett Nose M.D.   On: 09/20/2017 08:54   Dg Lumbar Spine Complete  Result Date: 09/20/2017 CLINICAL DATA:  Seizure, fall. Back pain and tingling down right leg EXAM: LUMBAR SPINE - COMPLETE 4+ VIEW COMPARISON:  CT 06/03/2014 FINDINGS:  Normal alignment. No fracture. Disc spaces maintained. SI joints are symmetric and unremarkable. IMPRESSION: Negative. Electronically Signed   By: Charlett NoseKevin  Dover M.D.   On: 09/20/2017 08:55   Ct Head Wo Contrast  Result Date: 09/20/2017 CLINICAL DATA:  Seizures with fall out of bed. EXAM: CT HEAD WITHOUT CONTRAST CT CERVICAL SPINE WITHOUT CONTRAST TECHNIQUE: Multidetector CT imaging of the head and cervical spine was performed following the standard protocol without intravenous contrast. Multiplanar CT image reconstructions of the cervical spine were also generated. COMPARISON:  None. FINDINGS: CT HEAD FINDINGS Brain: No evidence of acute infarction, hemorrhage, hydrocephalus, extra-axial collection or mass lesion/mass effect. Vascular: No hyperdense vessel or unexpected calcification. Skull: Normal. Negative for fracture or focal lesion. Sinuses/Orbits: No acute finding.  Other: None. CT CERVICAL SPINE FINDINGS Alignment: Normal. Skull base and vertebrae: No acute fracture. No primary bone lesion or focal pathologic process. Soft tissues and spinal canal: No prevertebral fluid or swelling. No visible canal hematoma. Disc levels: Mild multilevel degenerative changes. Mild bilateral facet arthropathy at C7-T1. Upper chest: Negative. Other: None. IMPRESSION: 1.  No acute intracranial abnormality. 2.  No acute cervical spine fracture. Electronically Signed   By: Obie DredgeWilliam T Derry M.D.   On: 09/20/2017 09:11   Ct Cervical Spine Wo Contrast  Result Date: 09/20/2017 CLINICAL DATA:  Seizures with fall out of bed. EXAM: CT HEAD WITHOUT CONTRAST CT CERVICAL SPINE WITHOUT CONTRAST TECHNIQUE: Multidetector CT imaging of the head and cervical spine was performed following the standard protocol without intravenous contrast. Multiplanar CT image reconstructions of the cervical spine were also generated. COMPARISON:  None. FINDINGS: CT HEAD FINDINGS Brain: No evidence of acute infarction, hemorrhage, hydrocephalus, extra-axial collection or mass lesion/mass effect. Vascular: No hyperdense vessel or unexpected calcification. Skull: Normal. Negative for fracture or focal lesion. Sinuses/Orbits: No acute finding. Other: None. CT CERVICAL SPINE FINDINGS Alignment: Normal. Skull base and vertebrae: No acute fracture. No primary bone lesion or focal pathologic process. Soft tissues and spinal canal: No prevertebral fluid or swelling. No visible canal hematoma. Disc levels: Mild multilevel degenerative changes. Mild bilateral facet arthropathy at C7-T1. Upper chest: Negative. Other: None. IMPRESSION: 1.  No acute intracranial abnormality. 2.  No acute cervical spine fracture. Electronically Signed   By: Obie DredgeWilliam T Derry M.D.   On: 09/20/2017 09:11   [place] Abdomen Limited  Result Date: 09/20/2017 CLINICAL DATA:  Elevated LFTs. EXAM: ULTRASOUND ABDOMEN LIMITED RIGHT UPPER QUADRANT COMPARISON:  No recent  prior . FINDINGS: Gallbladder: Multiple gallstones noted. Gallbladder wall thickness 3 mm. Negative Murphy sign. Common bile duct: Diameter: 3 mm Liver: Increased echogenicity consistent with fatty infiltration and/or hepatocellular disease. No focal hepatic abnormality identified. Portal vein is patent on color Doppler imaging with normal direction of blood flow towards the liver. IMPRESSION: 1. Multiple gallstones. No biliary distention. Gallbladder wall thickness 3 mm. Negative Murphy sign. No biliary distention. 2. Increased hepatic echogenicity consistent fatty infiltration and/or hepatocellular disease. No focal hepatic abnormality identified. Electronically Signed   By: Maisie Fushomas  Register   On: 09/20/2017 10:38   EKG: Independently reviewed. NSR  Assessment/Plan Principal Problem:   Alcohol withdrawal seizure (HCC) Active Problems:   Alcohol abuse   Chronic back pain   Sciatica   Hypertension   Elevated liver enzymes   Fatty liver   Severe protein-calorie malnutrition (HCC)   Elevated bilirubin   Tobacco abuse   Benzodiazepine dependence (HCC)  1. Acute alcohol withdrawal with withdrawal seizure-admit to the stepdown unit for ongoing management.  Provide supportive care with IV  fluid hydration.  IV banana bag has been ordered.  Continue vitamin supplementation.  Consult dietitian.  CIWA protocol has been ordered.  Seizure precautions have been ordered.  Lorazepam is being used at this time because of liver disease. 2. Chronic back pain-imaging studies reviewed and no acute findings.  As needed medications ordered for pain. 3. Elevated liver enzymes-secondary to chronic alcoholism and fatty liver infiltration-we will monitor closely. 4. Tobacco abuse-will counsel against ongoing tobacco use and provide nicotine patch as needed. 5. Hypertension-resume home blood pressure medications and follow closely. 6. Benzodiazepine dependence- Lorazepam as ordered. 7. Marijuana abuse-follow  clinically. 8. Cholelithiasis - low fat diet ordered.  No cholecystitis seen on [place].  9. Leukocytosis - suspect reactive from recent seizure, will monitor closely.  10. Upper respiratory infection - cough syrup, oral augmentin ordered for presumed aspiration from seizure.   11. Severe protein calorie malnutrition - consult dietitian.    DVT Prophylaxis: lovenox Code Status: Full   Family Communication: sons   Disposition Plan: TBD   Time spent: 62 mins  Standley Dakins, MD [facility] Pager [phone]  If 7PM-7AM, please contact night-coverage [email or web address] Password [email or web address] 09/20/2017, 12:00 PM

## 2017-09-20 NOTE — Plan of Care (Signed)
Problem: Safety: Goal: Ability to remain free from injury will improve Outcome: Progressing Side rails up, bed in low position, bed alarm on, call bell and personal items within reach   

## 2017-09-20 NOTE — ED Triage Notes (Signed)
Per EMS patient had seizure this morning. Patient complains of back pain. Patient was lethargic on arrival to patients home.   Pt is alert x3 on arrival.

## 2017-09-20 NOTE — ED Provider Notes (Signed)
[facility] EMERGENCY DEPARTMENT Provider Note   CSN: [id number] Arrival date & time: 09/20/17  0740     History   Chief Complaint Chief Complaint  Patient presents with  . Seizures    HPI Ricky Werner is a 51 y.o. male.  HPI Pt was seen at 0755. Per EMS, pt's son and pt: c/o sudden onset and resolution of one episode of "seizure" that occurred PTA. Pt's son states he heard a "thud" then went to his father's room. He found pt on the floor "out of it" and "shaking." Pt states this lasted for a short time before pt "started to come out of it" but "was confused." EMS states pt became more awake/alert en route to the ED. Pt only c/o acute flair of his chronic LBP. Pt states he has hx of drinking etoh daily, but has not had etoh intake in the past 3 days "because I didn't feel well" and "thought I had the flu." Pt has been taking his xanax and smoking marijuana. Pt does not recall events today. Denies hx of seizures. Denies CP/palpitations, no SOB/cough, no abd pain, no N/V/D, no focal motor weakness.      Past Medical History:  Diagnosis Date  . Alcohol abuse   . Chronic back pain   . Hypertension   . Sciatica     There are no active problems to display for this patient.   History reviewed. No pertinent surgical history.     Home Medications    Prior to Admission medications   Medication Sig Start Date End Date Taking? Authorizing Provider  ALPRAZolam Prudy Feeler) 0.5 MG tablet Take 0.5 mg by mouth 3 (three) times daily as needed. anxiety 12/18/14   [provider]  ALPRAZolam Prudy Feeler) 1 MG tablet Take 1 mg by mouth 3 (three) times daily as needed for anxiety (Patient states tha typically he only takes 2 a day).    [provider]  amLODipine (NORVASC) 10 MG tablet Take 10 mg by mouth daily. 12/01/14   [provider]  aspirin EC 81 MG tablet Take 81 mg by mouth daily.    [provider]  DiphenhydrAMINE HCl (ALLERGY MEDICATION PO) Take 1  tablet by mouth daily as needed.    [provider]  Hyprom-Naphaz-Polysorb-Zn Sulf (CLEAR EYES COMPLETE OP) Place 1 drop into both eyes daily.    [provider]  lisinopril (PRINIVIL,ZESTRIL) 20 MG tablet Take 20 mg by mouth 2 (two) times daily. 12/01/14   [provider]  mupirocin nasal ointment (BACTROBAN) 2 % Apply in right nostril daily 12/24/14   Janne Napoleon, NP  oxyCODONE-acetaminophen (PERCOCET) 10-325 MG per tablet Take 1 tablet by mouth every 4 (four) hours as needed for pain.    [provider]  tamsulosin (FLOMAX) 0.4 MG CAPS capsule Take 0.4 mg by mouth daily. 12/01/14   [provider]    Family History Family History  Problem Relation Age of Onset  . Hypertension Mother   . Hypertension Brother   . Hypertension Other     Social History Social History  Substance Use Topics  . Smoking status: Current Every Day Smoker    Packs/day: 1.50    Types: Cigarettes  . Smokeless tobacco: Never Used  . Alcohol use Yes     Comment: "1 pint of liquor every day"     Allergies   Patient has no known allergies.   Review of Systems Review of Systems ROS: Statement: All systems negative except  as marked or noted in the HPI; Constitutional: Negative for fever and chills. ; ; Eyes: Negative for eye pain, redness and discharge. ; ; ENMT: Negative for ear pain, hoarseness, nasal congestion, sinus pressure and sore throat. ; ; Cardiovascular: Negative for chest pain, palpitations, diaphoresis, dyspnea and peripheral edema. ; ; Respiratory: Negative for cough, wheezing and stridor. ; ; Gastrointestinal: Negative for nausea, vomiting, diarrhea, abdominal pain, blood in stool, hematemesis, jaundice and rectal bleeding. . ; ; Genitourinary: Negative for dysuria, flank pain and hematuria. ; ; Musculoskeletal: +chronic back pain. Negative for neck pain. Negative for swelling and deformity.; ; Skin: Negative for pruritus, rash, abrasions, blisters,  bruising and skin lesion.; ; Neuro: Negative for headache, lightheadedness and neck stiffness. Negative for weakness, extremity weakness, paresthesias, +seizure.       Physical Exam Updated Vital Signs BP (!) 147/98   Pulse 98   Temp 99.1 F (37.3 C) (Oral)   Resp 19   SpO2 96%    Patient Vitals for the past 24 hrs:  BP Temp Temp src Pulse Resp SpO2  09/20/17 1128 (!) 147/98 - - 98 - -  09/20/17 1100 (!) 147/98 - - 98 19 96 %  09/20/17 1030 130/88 - - 90 18 95 %  09/20/17 1008 128/88 - - 98 18 96 %  09/20/17 1000 128/88 - - 83 18 94 %  09/20/17 0933 128/76 - - 88 - -  09/20/17 0930 128/76 - - 88 14 97 %  09/20/17 0929 126/81 - - 83 20 96 %  09/20/17 0900 - - - (!) 104 (!) 22 96 %  09/20/17 0853 (!) 137/95 - - 94 18 94 %  09/20/17 0830 (!) 137/95 - - 98 19 95 %  09/20/17 0816 - 99.1 F (37.3 C) Oral - - -  09/20/17 0803 (!) 140/94 - - 96 - -  09/20/17 0800 (!) 126/96 99.1 F (37.3 C) - (!) 102 19 93 %  09/20/17 0752 (!) 142/94 - - 96 (!) 26 93 %  09/20/17 0743 140/85 - - (!) 110 - 94 %     Physical Exam 0800: Physical examination:  Nursing notes reviewed; Vital signs and O2 SAT reviewed;  Constitutional: Well developed, Well nourished, Well hydrated, In no acute distress; Head:  Normocephalic, atraumatic; Eyes: EOMI, PERRL, No scleral icterus; ENMT: Mouth and pharynx normal, Mucous membranes moist; Neck: Supple, Full range of motion, No lymphadenopathy; Cardiovascular: Tachycardic rate and rhythm, No gallop; Respiratory: Breath sounds clear & equal bilaterally, No wheezes.  Speaking full sentences with ease, Normal respiratory effort/excursion; Chest: Nontender, Movement normal; Abdomen: Soft, Nontender, Nondistended, Normal bowel sounds; Genitourinary: No CVA tenderness; Extremities: Pulses normal, No tenderness, No edema, No calf edema or asymmetry.; Neuro: AA&Ox3, Major CN grossly intact. No facial droop. Speech clear. No gross focal motor or sensory deficits in extremities.;  Skin: Color normal, Warm, Dry.   ED Treatments / Results  Labs (all labs ordered are listed, but only abnormal results are displayed)   EKG  EKG Interpretation  Date/Time:  Friday September 20 2017 11:51:00 EDT Ventricular Rate:  90 PR Interval:    QRS Duration: 101 QT Interval:  385 QTC Calculation: 472 R Axis:   55 Text Interpretation:  Sinus rhythm When compared with ECG of 04/23/2017 No significant change was found Confirmed by Samuel Jester [id number]) on 09/20/2017 11:56:18 AM       Radiology   Procedures Procedures (including critical care time)  Medications Ordered in ED Medications  LORazepam (  ATIVAN) tablet 1 mg (not administered)     Initial Impression / Assessment and Plan / ED Course  I have reviewed the triage vital signs and the nursing notes.  Pertinent labs & imaging results that were available during my care of the patient were reviewed by me and considered in my medical decision making (see chart for details).  MDM Reviewed: previous chart, nursing note and vitals Reviewed previous: labs and ECG Interpretation: labs, ECG, x-ray and CT scan   ED ECG REPORT   Date: 09/20/2017  Rate: 92  Rhythm: normal sinus rhythm  QRS Axis: normal  Intervals: normal  ST/T Wave abnormalities: normal  Conduction Disutrbances:none  Narrative Interpretation:   Old EKG Reviewed: unchanged; no significant changes from previous EKG dated 04/23/2017. I have personally reviewed the EKG tracing and agree with the computerized printout as noted.  Results for orders placed or performed during the hospital encounter of 09/20/17  Comprehensive metabolic panel  Result Value Ref Range   Sodium 139 135 - 145 mmol/L   Potassium 3.8 3.5 - 5.1 mmol/L   Chloride 102 101 - 111 mmol/L   CO2 26 22 - 32 mmol/L   Glucose, Bld 140 (H) 65 - 99 mg/dL   BUN 18 6 - 20 mg/dL   Creatinine, Ser 1.30 0.61 - 1.24 mg/dL   Calcium 9.7 8.9 - 86.5 mg/dL   Total Protein 8.3 (H) 6.5 - 8.1  g/dL   Albumin 4.3 3.5 - 5.0 g/dL   AST 784 (H) 15 - 41 U/L   ALT 203 (H) 17 - 63 U/L   Alkaline Phosphatase 46 38 - 126 U/L   Total Bilirubin 1.8 (H) 0.3 - 1.2 mg/dL   GFR calc non Af Amer >60 >60 mL/min   GFR calc Af Amer >60 >60 mL/min   Anion gap 11 5 - 15  Ethanol  Result Value Ref Range   Alcohol, Ethyl (B) <10 <10 mg/dL  CBC with Differential  Result Value Ref Range   WBC 14.9 (H) 4.0 - 10.5 K/uL   RBC 4.88 4.22 - 5.81 MIL/uL   Hemoglobin 16.8 13.0 - 17.0 g/dL   HCT 69.6 29.5 - 28.4 %   MCV 98.6 78.0 - 100.0 fL   MCH 34.4 (H) 26.0 - 34.0 pg   MCHC 34.9 30.0 - 36.0 g/dL   RDW 13.2 44.0 - 10.2 %   Platelets 234 150 - 400 K/uL   Neutrophils Relative % 79 %   Neutro Abs 11.7 (H) 1.7 - 7.7 K/uL   Lymphocytes Relative 14 %   Lymphs Abs 2.1 0.7 - 4.0 K/uL   Monocytes Relative 7 %   Monocytes Absolute 1.1 (H) 0.1 - 1.0 K/uL   Eosinophils Relative 0 %   Eosinophils Absolute 0.0 0.0 - 0.7 K/uL   Basophils Relative 0 %   Basophils Absolute 0.0 0.0 - 0.1 K/uL  Urine rapid drug screen (hosp performed)  Result Value Ref Range   Opiates NONE DETECTED NONE DETECTED   Cocaine NONE DETECTED NONE DETECTED   Benzodiazepines POSITIVE (A) NONE DETECTED   Amphetamines NONE DETECTED NONE DETECTED   Tetrahydrocannabinol POSITIVE (A) NONE DETECTED   Barbiturates NONE DETECTED NONE DETECTED  Troponin I  Result Value Ref Range   Troponin I <0.03 <0.03 ng/mL  Urinalysis, Routine w reflex microscopic  Result Value Ref Range   Color, Urine AMBER (A) YELLOW   APPearance CLOUDY (A) CLEAR   Specific Gravity, Urine 1.019 1.005 - 1.030   pH 5.0  5.0 - 8.0   Glucose, UA NEGATIVE NEGATIVE mg/dL   Hgb urine dipstick MODERATE (A) NEGATIVE   Bilirubin Urine NEGATIVE NEGATIVE   Ketones, ur NEGATIVE NEGATIVE mg/dL   Protein, ur 147100 (A) NEGATIVE mg/dL   Nitrite NEGATIVE NEGATIVE   Leukocytes, UA NEGATIVE NEGATIVE   RBC / HPF 0-5 0 - 5 RBC/hpf   WBC, UA 0-5 0 - 5 WBC/hpf   Bacteria, UA RARE (A)  NONE SEEN   Squamous Epithelial / LPF 0-5 (A) NONE SEEN   Mucus PRESENT    Hyaline Casts, UA PRESENT    Sperm, UA PRESENT   Ammonia  Result Value Ref Range   Ammonia 25 9 - 35 umol/L  CBG monitoring, ED  Result Value Ref Range   Glucose-Capillary 193 (H) 65 - 99 mg/dL   Dg Chest 1 View Result Date: 09/20/2017 CLINICAL DATA:  Seizure, fall EXAM: CHEST 1 VIEW COMPARISON:  04/23/2017 FINDINGS: Heart and mediastinal contours are within normal limits. No focal opacities or effusions. No acute bony abnormality. IMPRESSION: No active disease. Electronically Signed   By: Charlett NoseKevin  Dover M.D.   On: 09/20/2017 08:54   Dg Lumbar Spine Complete Result Date: 09/20/2017 CLINICAL DATA:  Seizure, fall. Back pain and tingling down right leg EXAM: LUMBAR SPINE - COMPLETE 4+ VIEW COMPARISON:  CT 06/03/2014 FINDINGS: Normal alignment. No fracture. Disc spaces maintained. SI joints are symmetric and unremarkable. IMPRESSION: Negative. Electronically Signed   By: Charlett NoseKevin  Dover M.D.   On: 09/20/2017 08:55   Ct Head Wo Contrast Result Date: 09/20/2017 CLINICAL DATA:  Seizures with fall out of bed. EXAM: CT HEAD WITHOUT CONTRAST CT CERVICAL SPINE WITHOUT CONTRAST TECHNIQUE: Multidetector CT imaging of the head and cervical spine was performed following the standard protocol without intravenous contrast. Multiplanar CT image reconstructions of the cervical spine were also generated. COMPARISON:  None. FINDINGS: CT HEAD FINDINGS Brain: No evidence of acute infarction, hemorrhage, hydrocephalus, extra-axial collection or mass lesion/mass effect. Vascular: No hyperdense vessel or unexpected calcification. Skull: Normal. Negative for fracture or focal lesion. Sinuses/Orbits: No acute finding. Other: None. CT CERVICAL SPINE FINDINGS Alignment: Normal. Skull base and vertebrae: No acute fracture. No primary bone lesion or focal pathologic process. Soft tissues and spinal canal: No prevertebral fluid or swelling. No visible canal  hematoma. Disc levels: Mild multilevel degenerative changes. Mild bilateral facet arthropathy at C7-T1. Upper chest: Negative. Other: None. IMPRESSION: 1.  No acute intracranial abnormality. 2.  No acute cervical spine fracture. Electronically Signed   By: Obie DredgeWilliam T Derry M.D.   On: 09/20/2017 09:11   Ct Cervical Spine Wo Contrast Result Date: 09/20/2017 CLINICAL DATA:  Seizures with fall out of bed. EXAM: CT HEAD WITHOUT CONTRAST CT CERVICAL SPINE WITHOUT CONTRAST TECHNIQUE: Multidetector CT imaging of the head and cervical spine was performed following the standard protocol without intravenous contrast. Multiplanar CT image reconstructions of the cervical spine were also generated. COMPARISON:  None. FINDINGS: CT HEAD FINDINGS Brain: No evidence of acute infarction, hemorrhage, hydrocephalus, extra-axial collection or mass lesion/mass effect. Vascular: No hyperdense vessel or unexpected calcification. Skull: Normal. Negative for fracture or focal lesion. Sinuses/Orbits: No acute finding. Other: None. CT CERVICAL SPINE FINDINGS Alignment: Normal. Skull base and vertebrae: No acute fracture. No primary bone lesion or focal pathologic process. Soft tissues and spinal canal: No prevertebral fluid or swelling. No visible canal hematoma. Disc levels: Mild multilevel degenerative changes. Mild bilateral facet arthropathy at C7-T1. Upper chest: Negative. Other: None. IMPRESSION: 1.  No acute intracranial abnormality. 2.  No acute cervical spine fracture. Electronically Signed   By: Obie Dredge M.D.   On: 09/20/2017 09:11   US Abdomen Limited Result Date: 09/20/2017 CLINICAL DATA:  Elevated LFTs. EXAM: ULTRASOUND ABDOMEN LIMITED RIGHT UPPER QUADRANT COMPARISON:  No recent prior . FINDINGS: Gallbladder: Multiple gallstones noted. Gallbladder wall thickness 3 mm. Negative Murphy sign. Common bile duct: Diameter: 3 mm Liver: Increased echogenicity consistent with fatty infiltration and/or hepatocellular disease.  No focal hepatic abnormality identified. Portal vein is patent on color Doppler imaging with normal direction of blood flow towards the liver. IMPRESSION: 1. Multiple gallstones. No biliary distention. Gallbladder wall thickness 3 mm. Negative Murphy sign. No biliary distention. 2. Increased hepatic echogenicity consistent fatty infiltration and/or hepatocellular disease. No focal hepatic abnormality identified. Electronically Signed   By: Maisie Fus  Register   On: 09/20/2017 10:38    1145:  Pt has not had etoh intake for the past 2 to 3 days. Seizure today likely due to same. Ativan given on arrival, CIWA protocol ordered. Pt initially improving, but then again tachycardic and BP increasing. Will dose IVF bolus and ativan. Concern for continued etoh withdrawal.  T/C to [facility]. Laural Benes, case discussed, including:  HPI, pertinent PM/SHx, VS/PE, dx testing, ED course and treatment:  Agreeable to admit.   Final Clinical Impressions(s) / ED Diagnoses   Final diagnoses:  None    New Prescriptions New Prescriptions   No medications on file     Samuel Jester, DO 09/21/17 1028

## 2017-09-20 NOTE — ED Notes (Signed)
Pt informed of need for urine sample. Pt states he cannot provide one at this time but will as soon as able 

## 2017-09-20 NOTE — ED Notes (Signed)
Patient transported to CT 

## 2017-09-20 NOTE — ED Notes (Signed)
CBG 193. 

## 2017-09-20 NOTE — Progress Notes (Signed)
Initial Nutrition Assessment  DOCUMENTATION CODES:  Not applicable  INTERVENTION:  Maximize nutrition while admitted  -Ensure Enlive BID  -MVI with minerals  NUTRITION DIAGNOSIS:  Poor nutritional Quality of life related to social / environmental circumstances (Polysubstance abuse) as evidenced by Pt consuming 1-2 pints liquor/day.  GOAL:  Patient will meet greater than or equal to 90% of their needs  MONITOR:  PO intake, Supplement acceptance, Labs  REASON FOR ASSESSMENT:  Consult Assessment of nutrition requirement/status  ASSESSMENT:  51 y/o male PMHx ETOH abuse (1-2 pints/day), HTN, Chronic Back Pain. Abruptly quit drinking 3 days ago and had seizure this morning, witnessed by son. Transferred to ED via EMS. Worked up for elevated LFTs and bilirubin. Admitted for management of alcohol withdrawal seizures.    No RD on site until Monday. RD operating remotely.   Per notes, there is no mention of poor po intake. However, it is suspected that he has a very poor nutritional baseline given his polysubstance abuse.   Per charted weight history, his weight has been quite stable for the last 2.5 years.  While admitted, will attempt to optimize nutrition. Add mvi, ensure enlive BID. Will monitor PO intakes.   Labs: WBC: 14.9, Elevated liver enzymes.  Meds: Abx, Colace, Folate, thiamin, IVF,    Recent Labs Lab 09/20/17 0815  NA 139  K 3.8  CL 102  CO2 26  BUN 18  CREATININE 0.81  CALCIUM 9.7  GLUCOSE 140*   NUTRITION - FOCUSED PHYSICAL EXAM: Unable to conduct  Diet Order:  Diet Heart Room service appropriate? Yes; Fluid consistency: Thin  EDUCATION NEEDS:  Not appropriate for education at this time  Skin:  Skin Assessment: Reviewed RN Assessment  Last BM:  10/23  Height:  Ht Readings from Last 1 Encounters:  09/20/17 5\' 6"  (1.676 m)   Weight:  Wt Readings from Last 1 Encounters:  09/20/17 153 lb 14.1 oz (69.8 kg)   Wt Readings from Last 10 Encounters:   09/20/17 153 lb 14.1 oz (69.8 kg)  04/23/17 155 lb (70.3 kg)  12/24/14 150 lb (68 kg)  12/22/14 155 lb (70.3 kg)   Ideal Body Weight:  64.54 kg  BMI:  Body mass index is 24.84 kg/m.  Estimated Nutritional Needs:  Kcal:  1800-2000 kcals (26-29 kcal/day) Protein:  77-91 g (1.1-1.3 g/kg bw) Fluid:  1.8-2 L fluid (31ml/kcal)  Christophe LouisNathan Edword Cu RD, LDN, CNSC Clinical Nutrition Pager: [phone] 09/20/2017 3:27 PM

## 2017-09-21 LAB — BASIC METABOLIC PANEL
ANION GAP: 9 (ref 5–15)
BUN: 14 mg/dL (ref 6–20)
CALCIUM: 8.6 mg/dL — AB (ref 8.9–10.3)
CO2: 24 mmol/L (ref 22–32)
CREATININE: 0.66 mg/dL (ref 0.61–1.24)
Chloride: 109 mmol/L (ref 101–111)
Glucose, Bld: 110 mg/dL — ABNORMAL HIGH (ref 65–99)
Potassium: 3.2 mmol/L — ABNORMAL LOW (ref 3.5–5.1)
SODIUM: 142 mmol/L (ref 135–145)

## 2017-09-21 LAB — HEPATIC FUNCTION PANEL
ALT: 153 U/L — ABNORMAL HIGH (ref 17–63)
AST: 136 U/L — AB (ref 15–41)
Albumin: 3.7 g/dL (ref 3.5–5.0)
Alkaline Phosphatase: 35 U/L — ABNORMAL LOW (ref 38–126)
BILIRUBIN DIRECT: 0.3 mg/dL (ref 0.1–0.5)
BILIRUBIN INDIRECT: 1.3 mg/dL — AB (ref 0.3–0.9)
BILIRUBIN TOTAL: 1.6 mg/dL — AB (ref 0.3–1.2)
Total Protein: 6.9 g/dL (ref 6.5–8.1)

## 2017-09-21 LAB — CBC WITH DIFFERENTIAL/PLATELET
BASOS PCT: 0 %
Basophils Absolute: 0 10*3/uL (ref 0.0–0.1)
EOS ABS: 0 10*3/uL (ref 0.0–0.7)
EOS PCT: 0 %
HCT: 41.9 % (ref 39.0–52.0)
Hemoglobin: 14.4 g/dL (ref 13.0–17.0)
LYMPHS ABS: 3.1 10*3/uL (ref 0.7–4.0)
Lymphocytes Relative: 23 %
MCH: 33.8 pg (ref 26.0–34.0)
MCHC: 34.4 g/dL (ref 30.0–36.0)
MCV: 98.4 fL (ref 78.0–100.0)
MONOS PCT: 11 %
Monocytes Absolute: 1.5 10*3/uL — ABNORMAL HIGH (ref 0.1–1.0)
NEUTROS PCT: 66 %
Neutro Abs: 8.7 10*3/uL — ABNORMAL HIGH (ref 1.7–7.7)
PLATELETS: 202 10*3/uL (ref 150–400)
RBC: 4.26 MIL/uL (ref 4.22–5.81)
RDW: 12.1 % (ref 11.5–15.5)
WBC: 13.3 10*3/uL — AB (ref 4.0–10.5)

## 2017-09-21 LAB — PROTIME-INR
INR: 1.13
Prothrombin Time: 14.4 seconds (ref 11.4–15.2)

## 2017-09-21 LAB — MAGNESIUM: Magnesium: 2 mg/dL (ref 1.7–2.4)

## 2017-09-21 LAB — HIV ANTIBODY (ROUTINE TESTING W REFLEX): HIV SCREEN 4TH GENERATION: NONREACTIVE

## 2017-09-21 MED ORDER — POTASSIUM CHLORIDE CRYS ER 20 MEQ PO TBCR
60.0000 meq | EXTENDED_RELEASE_TABLET | Freq: Once | ORAL | Status: AC
  Administered 2017-09-21: 60 meq via ORAL
  Filled 2017-09-21: qty 3

## 2017-09-21 NOTE — Progress Notes (Signed)
PROGRESS NOTE   Ricky Werner  ZOX:[id number]  DOB: [birth year]  DOA: 09/20/2017 PCP: [facility]   Brief Admission Hx: Ricky Werner is a 51 y.o. male who is a known heavy alcohol consumer, drinking at least 1-2 pints of liquor per day abruptly stopped drinking alcohol 3 days ago and ended up having a seizure earlier this morning.  He was discovered by his son who heard a large thud from the patient's room and when he arrived he found the patient actively seizing and poorly responsive.  EMS was called.  He was found to be lethargic and postictal after the seizure.  MDM/Assessment & Plan:   1. Acute alcohol withdrawal with withdrawal seizure-continue care in the stepdown unit for ongoing management.  No further seizure activity. Provide supportive care with IV fluid hydration.  IV banana bag has been given.  Continue vitamin supplementation.  Consulted dietitian.  CIWA protocol in place and lorazepam being given.  Seizure precautions have been ordered.  2. Chronic back pain-imaging studies reviewed and no acute findings.  As needed medications ordered for pain. 3. Elevated liver enzymes-secondary to chronic alcoholism and fatty liver infiltration-we will monitor closely, they are trending down. 4. Tobacco abuse-will counsel against ongoing tobacco use and provide nicotine patch as needed. 5. Hypertension-resume home blood pressure medications and follow closely. 6. Benzodiazepine dependence- Lorazepam as ordered. 7. Marijuana abuse-follow clinically. 8. Cholelithiasis - low fat diet ordered.  No cholecystitis seen on [place].  9. Leukocytosis - trending down.  Suspect reactive from recent seizure, will monitor closely.  10. Upper respiratory infection - cough syrup, oral augmentin ordered for presumed aspiration from seizure.   11. Severe protein calorie malnutrition - consulted dietitian.  MVI, supplements ordered.      DVT Prophylaxis: lovenox Code Status: Full     Family Communication: sons   Disposition Plan: TBD   Subjective: Pt awake, he says he feels a little better, he is oriented to place and time and person.   Objective: Vitals:   09/21/17 0300 09/21/17 0400 09/21/17 0500 09/21/17 0600  BP:  (!) 137/93  (!) 146/99  Pulse: (!) 103 89 92 95  Resp: (!) 26 (!) 22 18 17   Temp:      TempSrc:      SpO2: 96% 94% 96% 94%  Weight:      Height:        Intake/Output Summary (Last 24 hours) at 09/21/17 0656 Last data filed at 09/21/17 0600  Gross per 24 hour  Intake             3235 ml  Output             1850 ml  Net             1385 ml   Filed Weights   09/20/17 1430  Weight: 69.8 kg (153 lb 14.1 oz)    REVIEW OF SYSTEMS  As per history otherwise all reviewed and reported negative  Exam:  General exam: awake, alert, oriented, NAD, cooperative.  Respiratory system: Clear. No increased work of breathing. Cardiovascular system: S1 & S2 heard, RRR. No JVD, murmurs, gallops, clicks or pedal edema. Gastrointestinal system: Abdomen is nondistended, soft and nontender. Normal bowel sounds heard. Central nervous system: Alert and oriented. No focal neurological deficits. Extremities: no CCE.  Data Reviewed: Basic Metabolic Panel:  Recent Labs Lab 09/20/17 0815 09/21/17 0448  NA 139 142  K 3.8 3.2*  CL 102 109  CO2 26  24  GLUCOSE 140* 110*  BUN 18 14  CREATININE 0.81 0.66  CALCIUM 9.7 8.6*  MG  --  2.0   Liver Function Tests:  Recent Labs Lab 09/20/17 0815 09/21/17 0448  AST 203* 136*  ALT 203* 153*  ALKPHOS 46 35*  BILITOT 1.8* 1.6*  PROT 8.3* 6.9  ALBUMIN 4.3 3.7   No results for input(s): LIPASE, AMYLASE in the last 168 hours.  Recent Labs Lab 09/20/17 0929  AMMONIA 25   CBC:  Recent Labs Lab 09/20/17 0815 09/21/17 0448  WBC 14.9* 13.3*  NEUTROABS 11.7* 8.7*  HGB 16.8 14.4  HCT 48.1 41.9  MCV 98.6 98.4  PLT 234 202   Cardiac Enzymes:  Recent Labs Lab 09/20/17 0815  TROPONINI <0.03    CBG (last 3)   Recent Labs  09/20/17 0758  GLUCAP 193*   Recent Results (from the past 240 hour(s))  MRSA PCR Screening     Status: None   Collection Time: 09/20/17  3:40 PM  Result Value Ref Range Status   MRSA by PCR NEGATIVE NEGATIVE Final    Comment:        The GeneXpert MRSA Assay (FDA approved for NASAL specimens only), is one component of a comprehensive MRSA colonization surveillance program. It is not intended to diagnose MRSA infection nor to guide or monitor treatment for MRSA infections.      Studies: Dg Chest 1 View  Result Date: 09/20/2017 CLINICAL DATA:  Seizure, fall EXAM: CHEST 1 VIEW COMPARISON:  04/23/2017 FINDINGS: Heart and mediastinal contours are within normal limits. No focal opacities or effusions. No acute bony abnormality. IMPRESSION: No active disease. Electronically Signed   By: Charlett NoseKevin  Dover M.D.   On: 09/20/2017 08:54   Dg Lumbar Spine Complete  Result Date: 09/20/2017 CLINICAL DATA:  Seizure, fall. Back pain and tingling down right leg EXAM: LUMBAR SPINE - COMPLETE 4+ VIEW COMPARISON:  CT 06/03/2014 FINDINGS: Normal alignment. No fracture. Disc spaces maintained. SI joints are symmetric and unremarkable. IMPRESSION: Negative. Electronically Signed   By: Charlett NoseKevin  Dover M.D.   On: 09/20/2017 08:55   Ct Head Wo Contrast  Result Date: 09/20/2017 CLINICAL DATA:  Seizures with fall out of bed. EXAM: CT HEAD WITHOUT CONTRAST CT CERVICAL SPINE WITHOUT CONTRAST TECHNIQUE: Multidetector CT imaging of the head and cervical spine was performed following the standard protocol without intravenous contrast. Multiplanar CT image reconstructions of the cervical spine were also generated. COMPARISON:  None. FINDINGS: CT HEAD FINDINGS Brain: No evidence of acute infarction, hemorrhage, hydrocephalus, extra-axial collection or mass lesion/mass effect. Vascular: No hyperdense vessel or unexpected calcification. Skull: Normal. Negative for fracture or focal lesion.  Sinuses/Orbits: No acute finding. Other: None. CT CERVICAL SPINE FINDINGS Alignment: Normal. Skull base and vertebrae: No acute fracture. No primary bone lesion or focal pathologic process. Soft tissues and spinal canal: No prevertebral fluid or swelling. No visible canal hematoma. Disc levels: Mild multilevel degenerative changes. Mild bilateral facet arthropathy at C7-T1. Upper chest: Negative. Other: None. IMPRESSION: 1.  No acute intracranial abnormality. 2.  No acute cervical spine fracture. Electronically Signed   By: Obie DredgeWilliam T Derry M.D.   On: 09/20/2017 09:11   Ct Cervical Spine Wo Contrast  Result Date: 09/20/2017 CLINICAL DATA:  Seizures with fall out of bed. EXAM: CT HEAD WITHOUT CONTRAST CT CERVICAL SPINE WITHOUT CONTRAST TECHNIQUE: Multidetector CT imaging of the head and cervical spine was performed following the standard protocol without intravenous contrast. Multiplanar CT image reconstructions of the cervical spine were  also generated. COMPARISON:  None. FINDINGS: CT HEAD FINDINGS Brain: No evidence of acute infarction, hemorrhage, hydrocephalus, extra-axial collection or mass lesion/mass effect. Vascular: No hyperdense vessel or unexpected calcification. Skull: Normal. Negative for fracture or focal lesion. Sinuses/Orbits: No acute finding. Other: None. CT CERVICAL SPINE FINDINGS Alignment: Normal. Skull base and vertebrae: No acute fracture. No primary bone lesion or focal pathologic process. Soft tissues and spinal canal: No prevertebral fluid or swelling. No visible canal hematoma. Disc levels: Mild multilevel degenerative changes. Mild bilateral facet arthropathy at C7-T1. Upper chest: Negative. Other: None. IMPRESSION: 1.  No acute intracranial abnormality. 2.  No acute cervical spine fracture. Electronically Signed   By: Obie Dredge M.D.   On: 09/20/2017 09:11   US Abdomen Limited  Result Date: 09/20/2017 CLINICAL DATA:  Elevated LFTs. EXAM: ULTRASOUND ABDOMEN LIMITED RIGHT  UPPER QUADRANT COMPARISON:  No recent prior . FINDINGS: Gallbladder: Multiple gallstones noted. Gallbladder wall thickness 3 mm. Negative Murphy sign. Common bile duct: Diameter: 3 mm Liver: Increased echogenicity consistent with fatty infiltration and/or hepatocellular disease. No focal hepatic abnormality identified. Portal vein is patent on color Doppler imaging with normal direction of blood flow towards the liver. IMPRESSION: 1. Multiple gallstones. No biliary distention. Gallbladder wall thickness 3 mm. Negative Murphy sign. No biliary distention. 2. Increased hepatic echogenicity consistent fatty infiltration and/or hepatocellular disease. No focal hepatic abnormality identified. Electronically Signed   By: Maisie Fus  Register   On: 09/20/2017 10:38   Scheduled Meds: . amLODipine  10 mg Oral Daily  . amoxicillin-clavulanate  1 tablet Oral Q12H  . aspirin EC  81 mg Oral Daily  . enoxaparin (LOVENOX) injection  40 mg Subcutaneous Q24H  . feeding supplement (ENSURE ENLIVE)  237 mL Oral BID BM  . folic acid  1 mg Oral Daily  . lisinopril  20 mg Oral Daily  . multivitamin with minerals  1 tablet Oral Daily  . nicotine  21 mg Transdermal Daily  . polyvinyl alcohol  1 drop Both Eyes Daily  . potassium chloride  60 mEq Oral Once  . thiamine  100 mg Oral Daily   Continuous Infusions: . sodium chloride 150 mL/hr at 09/20/17 1800    Principal Problem:   Alcohol withdrawal seizure (HCC) Active Problems:   Alcohol abuse   Chronic back pain   Sciatica   Hypertension   Elevated liver enzymes   Fatty liver   Severe protein-calorie malnutrition (HCC)   Elevated bilirubin   Tobacco abuse   Benzodiazepine dependence (HCC)  Critical Care Time spent: 33 minutes  Standley Dakins, MD, FAAFP [facility] Pager [phone]  If 7PM-7AM, please contact night-coverage [email or web address] Password [email or web address] 09/21/2017, 6:56 AM    LOS: 1 day

## 2017-09-21 NOTE — Progress Notes (Signed)
IV occluded and will be removed.

## 2017-09-22 LAB — BASIC METABOLIC PANEL
Anion gap: 9 (ref 5–15)
BUN: 11 mg/dL (ref 6–20)
CHLORIDE: 109 mmol/L (ref 101–111)
CO2: 24 mmol/L (ref 22–32)
CREATININE: 0.49 mg/dL — AB (ref 0.61–1.24)
Calcium: 8.9 mg/dL (ref 8.9–10.3)
GFR calc non Af Amer: >60 mL/min (ref >60)
GLUCOSE: 104 mg/dL — AB (ref 65–99)
Potassium: 3.6 mmol/L (ref 3.5–5.1)
SODIUM: 142 mmol/L (ref 135–145)

## 2017-09-22 LAB — HEPATIC FUNCTION PANEL
ALBUMIN: 3.7 g/dL (ref 3.5–5.0)
ALT: 150 U/L — ABNORMAL HIGH (ref 17–63)
AST: 148 U/L — ABNORMAL HIGH (ref 15–41)
Alkaline Phosphatase: 34 U/L — ABNORMAL LOW (ref 38–126)
BILIRUBIN TOTAL: 1.2 mg/dL (ref 0.3–1.2)
Bilirubin, Direct: 0.3 mg/dL (ref 0.1–0.5)
Indirect Bilirubin: 0.9 mg/dL (ref 0.3–0.9)
Total Protein: 7 g/dL (ref 6.5–8.1)

## 2017-09-22 LAB — CBC WITH DIFFERENTIAL/PLATELET
Basophils Absolute: 0 10*3/uL (ref 0.0–0.1)
Basophils Relative: 0 %
EOS PCT: 1 %
Eosinophils Absolute: 0.1 10*3/uL (ref 0.0–0.7)
HEMATOCRIT: 42.4 % (ref 39.0–52.0)
Hemoglobin: 14.4 g/dL (ref 13.0–17.0)
LYMPHS ABS: 3 10*3/uL (ref 0.7–4.0)
LYMPHS PCT: 25 %
MCH: 33.9 pg (ref 26.0–34.0)
MCHC: 34 g/dL (ref 30.0–36.0)
MCV: 99.8 fL (ref 78.0–100.0)
MONO ABS: 1.6 10*3/uL — AB (ref 0.1–1.0)
MONOS PCT: 14 %
NEUTROS ABS: 7.3 10*3/uL (ref 1.7–7.7)
Neutrophils Relative %: 60 %
PLATELETS: 203 10*3/uL (ref 150–400)
RBC: 4.25 MIL/uL (ref 4.22–5.81)
RDW: 12.4 % (ref 11.5–15.5)
WBC: 11.9 10*3/uL — ABNORMAL HIGH (ref 4.0–10.5)

## 2017-09-22 LAB — MAGNESIUM: MAGNESIUM: 1.8 mg/dL (ref 1.7–2.4)

## 2017-09-22 MED ORDER — FOLIC ACID 1 MG PO TABS: 1.0000 mg | ORAL_TABLET | Freq: Every day | ORAL | 0 refills | Status: AC

## 2017-09-22 MED ORDER — AMOXICILLIN-POT CLAVULANATE 875-125 MG PO TABS: 1.0000 | ORAL_TABLET | Freq: Two times a day (BID) | ORAL | 0 refills | Status: AC

## 2017-09-22 MED ORDER — THIAMINE HCL 100 MG PO TABS: 100.0000 mg | ORAL_TABLET | Freq: Every day | ORAL | 0 refills | Status: AC

## 2017-09-22 MED ORDER — ADULT MULTIVITAMIN W/MINERALS CH: 1.0000 | ORAL_TABLET | Freq: Every day | ORAL | Status: DC

## 2017-09-22 NOTE — Discharge Instructions (Signed)
NO DRIVING OR OPERATING MACHINERY UNTIL YOU FOLLOW UP WITH PRIMARY CARE PROVIDER AND RECEIVE CLEARANCE TO RESUME THOSE ACTIVITIES.      Follow with Primary [facility], [facility]  and other consultant's as instructed your Hospitalist MD  Please get a complete blood count and chemistry panel checked by your Primary MD at your next visit, and again as instructed by your Primary MD.  Get Medicines reviewed and adjusted: Please take all your medications with you for your next visit with your Primary MD  Laboratory/radiological data: Please request your Primary MD to go over all hospital tests and procedure/radiological results at the follow up, please ask your Primary MD to get all Hospital records sent to his/her office.  In some cases, they will be blood work, cultures and biopsy results pending at the time of your discharge. Please request that your primary care M.D. follows up on these results.  Also Note the following: If you experience worsening of your admission symptoms, develop shortness of breath, life threatening emergency, suicidal or homicidal thoughts you must seek medical attention immediately by calling 911 or calling your MD immediately  if symptoms less severe.  You must read complete instructions/literature along with all the possible adverse reactions/side effects for all the Medicines you take and that have been prescribed to you. Take any new Medicines after you have completely understood and accpet all the possible adverse reactions/side effects.   Do not drive when taking Pain medications or sleeping medications (Benzodaizepines)  Do not take more than prescribed Pain, Sleep and Anxiety Medications. It is not advisable to combine anxiety,sleep and pain medications without talking with your primary care practitioner  Special Instructions: If you have smoked or chewed Tobacco  in the last 2 yrs please stop smoking, stop any regular Alcohol  and or any  Recreational drug use.  Wear Seat belts while driving.  Please note: You were cared for by a hospitalist during your hospital stay. Once you are discharged, your primary care physician will handle any further medical issues. Please note that NO REFILLS for any discharge medications will be authorized once you are discharged, as it is imperative that you return to your primary care physician (or establish a relationship with a primary care physician if you do not have one) for your post hospital discharge needs so that they can reassess your need for medications and monitor your lab values.

## 2017-09-22 NOTE — Discharge Summary (Signed)
Physician Discharge Summary  KLARK VANDERHOEF ZOX:[id number] DOB: 08-12-1966 DOA: 09/20/2017  PCP: [facility]  Admit date: 09/20/2017 Discharge date: 09/22/2017  Admitted From: Home  Disposition: Home   Recommendations for Outpatient Follow-up:  1. Follow up with PCP in 4-5 days for recheck. 2. Please obtain BMP/CBC in one week 3. Please enroll in outpatient alcohol abuse treatment program 4. No driving or operating machinery until cleared by PCP to resume those activities  Discharge Condition: STABLE   CODE STATUS: FULL    Brief Hospitalization Summary: Please see all hospital notes, images, labs for full details of the hospitalization.  HPI: Ricky Werner is a 51 y.o. male who is a known heavy alcohol consumer, drinking at least 1-2 pints of liquor per day abruptly stopped drinking alcohol 3 days ago and ended up having a seizure earlier this morning.  He was discovered by his son who heard a large thud from the patient's room and when he arrived he found the patient actively seizing and poorly responsive.  EMS was called.  He was found to be lethargic and postictal after the seizure.  On arrival in the emergency department he was noted to be alert x3.  The patient received Lorazepam.  The patient apparently had been taking alprazolam which he reports that he takes on a regular basis.  Despite this he still had a seizure.  His alcohol level in the serum was undetectable.  He was noted to be complaining of chronic low back pain.  He had some imaging studies for that in the ED.  He was also noted to have elevated liver enzymes and bilirubin.  He did have an abdominal ultrasound that revealed hepatocellular disease and fatty liver infiltration.  There was no acute gallbladder disease noted.  The patient is being admitted for alcohol withdrawal seizures and acute alcohol withdrawal.    Brief Admission Hx: Ricky Renault Hawkinsis a 51 y.o.malewho is a known heavy alcohol  consumer, drinking at least 1-2 pints of liquor per day abruptly stopped drinking alcohol 3 days ago and ended up having a seizure earlier this morning. He was discovered by his son who heard a large thud from the patient's room and when he arrived he found the patient actively seizing and poorly responsive. EMS was called. He was found to be lethargic and postictal after the seizure.  MDM/Assessment & Plan:   1. Acute alcohol withdrawal with withdrawal seizure-continue care in the stepdown unit for ongoing management. No further seizure activity. Provide supportive care with IV fluid hydration. IV banana bag has been given. Continue vitamin supplementation.  Pt has not had seizure and wants to go home, no alcohol withdrawal signs or symptoms. Will discharge home.  No driving or operating machinery until seen by PCP and cleared to resume those activities. Consulted dietitian who ordered supplements for patient. Seizure precautions have been given.  2. Chronic back pain-imaging studies reviewed and no acute findings. As needed medications ordered for pain. 3. Elevated liver enzymes-secondary to chronic alcoholism and fatty liver infiltration-we will monitor closely, they are trending down. 4. Tobacco abuse-counselled against ongoing tobacco use and provided nicotine patch. 5. Hypertension-resume home blood pressure medications and follow closely. 6. Benzodiazepine dependence- resume home medication at discharge. 7. Marijuana abuse-follow clinically. 8. Cholelithiasis - low fat diet ordered. No cholecystitis seen on [place].  9. Leukocytosis - trending down.  Suspect reactive from recent seizure, will monitor closely.  10. Upper respiratory infection - cough syrup, oral augmentin ordered  for presumed aspiration from seizure.  11. Severe protein calorie malnutrition - consulted dietitian.  MVI, supplements ordered.      DVT Prophylaxis:lovenox Code Status:Full  Family Communication:sons   Disposition Plan:Home  Discharge Diagnoses:  Principal Problem:   Alcohol withdrawal seizure (HCC) Active Problems:   Alcohol abuse   Chronic back pain   Sciatica   Hypertension   Elevated liver enzymes   Fatty liver   Severe protein-calorie malnutrition (HCC)   Elevated bilirubin   Tobacco abuse   Benzodiazepine dependence (HCC)   Discharge Instructions: Discharge Instructions    Call MD for:  difficulty breathing, headache or visual disturbances    Complete by:  As directed    Call MD for:  extreme fatigue    Complete by:  As directed    Call MD for:  hives    Complete by:  As directed    Call MD for:  persistant dizziness or light-headedness    Complete by:  As directed    Call MD for:  persistant nausea and vomiting    Complete by:  As directed    Call MD for:  severe uncontrolled pain    Complete by:  As directed    Increase activity slowly    Complete by:  As directed      Allergies as of 09/22/2017   No Known Allergies     Medication List    STOP taking these medications   mupirocin nasal ointment 2 % Commonly known as:  BACTROBAN     TAKE these medications   ALLERGY MEDICATION PO Take 1 tablet by mouth daily as needed.   ALPRAZolam 1 MG tablet Commonly known as:  XANAX Take 1 mg by mouth 3 (three) times daily as needed for anxiety (Patient states tha typically he only takes 2 a day).   amLODipine 10 MG tablet Commonly known as:  NORVASC Take 10 mg by mouth daily.   amoxicillin-clavulanate 875-125 MG tablet Commonly known as:  AUGMENTIN Take 1 tablet by mouth every 12 (twelve) hours.   aspirin EC 81 MG tablet Take 81 mg by mouth daily.   CLEAR EYES COMPLETE OP Place 1 drop into both eyes daily.   folic acid 1 MG tablet Commonly known as:  FOLVITE Take 1 tablet (1 mg total) by mouth daily.   lisinopril 20 MG tablet Commonly known as:  PRINIVIL,ZESTRIL Take 20 mg by mouth 2 (two) times daily.   multivitamin with minerals Tabs  tablet Take 1 tablet by mouth daily.   oxyCODONE-acetaminophen 10-325 MG tablet Commonly known as:  PERCOCET Take 1 tablet by mouth every 4 (four) hours as needed for pain.   thiamine 100 MG tablet Take 1 tablet (100 mg total) by mouth daily.      Follow-up Information    [facility]. Schedule an appointment as soon as possible for a visit in 4 day(s).   Specialty:  Family Medicine Contact information: [address] [place] [address] [phone]          No Known Allergies Current Discharge Medication List    START taking these medications   Details  amoxicillin-clavulanate (AUGMENTIN) 875-125 MG tablet Take 1 tablet by mouth every 12 (twelve) hours. Qty: 10 tablet, Refills: 0    folic acid (FOLVITE) 1 MG tablet Take 1 tablet (1 mg total) by mouth daily. Qty: 30 tablet, Refills: 0    Multiple Vitamin (MULTIVITAMIN WITH MINERALS) TABS tablet Take 1 tablet by mouth daily.  thiamine 100 MG tablet Take 1 tablet (100 mg total) by mouth daily. Qty: 30 tablet, Refills: 0      CONTINUE these medications which have NOT CHANGED   Details  ALPRAZolam (XANAX) 1 MG tablet Take 1 mg by mouth 3 (three) times daily as needed for anxiety (Patient states tha typically he only takes 2 a day).    amLODipine (NORVASC) 10 MG tablet Take 10 mg by mouth daily. Refills: 6    aspirin EC 81 MG tablet Take 81 mg by mouth daily.    DiphenhydrAMINE HCl (ALLERGY MEDICATION PO) Take 1 tablet by mouth daily as needed.    Hyprom-Naphaz-Polysorb-Zn Sulf (CLEAR EYES COMPLETE OP) Place 1 drop into both eyes daily.    lisinopril (PRINIVIL,ZESTRIL) 20 MG tablet Take 20 mg by mouth 2 (two) times daily. Refills: 6    oxyCODONE-acetaminophen (PERCOCET) 10-325 MG per tablet Take 1 tablet by mouth every 4 (four) hours as needed for pain.      STOP taking these medications     mupirocin nasal ointment (BACTROBAN) 2 %         Procedures/Studies: Dg Chest 1  View  Result Date: 09/20/2017 CLINICAL DATA:  Seizure, fall EXAM: CHEST 1 VIEW COMPARISON:  04/23/2017 FINDINGS: Heart and mediastinal contours are within normal limits. No focal opacities or effusions. No acute bony abnormality. IMPRESSION: No active disease. Electronically Signed   By: Charlett Nose M.D.   On: 09/20/2017 08:54   Dg Lumbar Spine Complete  Result Date: 09/20/2017 CLINICAL DATA:  Seizure, fall. Back pain and tingling down right leg EXAM: LUMBAR SPINE - COMPLETE 4+ VIEW COMPARISON:  CT 06/03/2014 FINDINGS: Normal alignment. No fracture. Disc spaces maintained. SI joints are symmetric and unremarkable. IMPRESSION: Negative. Electronically Signed   By: Charlett Nose M.D.   On: 09/20/2017 08:55   Ct Head Wo Contrast  Result Date: 09/20/2017 CLINICAL DATA:  Seizures with fall out of bed. EXAM: CT HEAD WITHOUT CONTRAST CT CERVICAL SPINE WITHOUT CONTRAST TECHNIQUE: Multidetector CT imaging of the head and cervical spine was performed following the standard protocol without intravenous contrast. Multiplanar CT image reconstructions of the cervical spine were also generated. COMPARISON:  None. FINDINGS: CT HEAD FINDINGS Brain: No evidence of acute infarction, hemorrhage, hydrocephalus, extra-axial collection or mass lesion/mass effect. Vascular: No hyperdense vessel or unexpected calcification. Skull: Normal. Negative for fracture or focal lesion. Sinuses/Orbits: No acute finding. Other: None. CT CERVICAL SPINE FINDINGS Alignment: Normal. Skull base and vertebrae: No acute fracture. No primary bone lesion or focal pathologic process. Soft tissues and spinal canal: No prevertebral fluid or swelling. No visible canal hematoma. Disc levels: Mild multilevel degenerative changes. Mild bilateral facet arthropathy at C7-T1. Upper chest: Negative. Other: None. IMPRESSION: 1.  No acute intracranial abnormality. 2.  No acute cervical spine fracture. Electronically Signed   By: Obie Dredge M.D.   On:  09/20/2017 09:11   Ct Cervical Spine Wo Contrast  Result Date: 09/20/2017 CLINICAL DATA:  Seizures with fall out of bed. EXAM: CT HEAD WITHOUT CONTRAST CT CERVICAL SPINE WITHOUT CONTRAST TECHNIQUE: Multidetector CT imaging of the head and cervical spine was performed following the standard protocol without intravenous contrast. Multiplanar CT image reconstructions of the cervical spine were also generated. COMPARISON:  None. FINDINGS: CT HEAD FINDINGS Brain: No evidence of acute infarction, hemorrhage, hydrocephalus, extra-axial collection or mass lesion/mass effect. Vascular: No hyperdense vessel or unexpected calcification. Skull: Normal. Negative for fracture or focal lesion. Sinuses/Orbits: No acute finding. Other: None. CT CERVICAL SPINE  FINDINGS Alignment: Normal. Skull base and vertebrae: No acute fracture. No primary bone lesion or focal pathologic process. Soft tissues and spinal canal: No prevertebral fluid or swelling. No visible canal hematoma. Disc levels: Mild multilevel degenerative changes. Mild bilateral facet arthropathy at C7-T1. Upper chest: Negative. Other: None. IMPRESSION: 1.  No acute intracranial abnormality. 2.  No acute cervical spine fracture. Electronically Signed   By: Obie Dredge M.D.   On: 09/20/2017 09:11   US Abdomen Limited  Result Date: 09/20/2017 CLINICAL DATA:  Elevated LFTs. EXAM: ULTRASOUND ABDOMEN LIMITED RIGHT UPPER QUADRANT COMPARISON:  No recent prior . FINDINGS: Gallbladder: Multiple gallstones noted. Gallbladder wall thickness 3 mm. Negative Murphy sign. Common bile duct: Diameter: 3 mm Liver: Increased echogenicity consistent with fatty infiltration and/or hepatocellular disease. No focal hepatic abnormality identified. Portal vein is patent on color Doppler imaging with normal direction of blood flow towards the liver. IMPRESSION: 1. Multiple gallstones. No biliary distention. Gallbladder wall thickness 3 mm. Negative Murphy sign. No biliary distention.  2. Increased hepatic echogenicity consistent fatty infiltration and/or hepatocellular disease. No focal hepatic abnormality identified. Electronically Signed   By: Maisie Fus  Register   On: 09/20/2017 10:38      Subjective: Pt wants to discharge home, says he feels fine, he has not had any more seizure.   Discharge Exam: Vitals:   09/22/17 0600 09/22/17 0736  BP: (!) 148/103   Pulse:    Resp: 20 20  Temp:  98.3 F (36.8 C)  SpO2:     Vitals:   09/22/17 0448 09/22/17 0500 09/22/17 0600 09/22/17 0736  BP:   (!) 148/103   Pulse:      Resp:  13 20 20   Temp:    98.3 F (36.8 C)  TempSrc:    Oral  SpO2:      Weight: 75.2 kg (165 lb 12.6 oz)     Height:       General: Pt is alert, awake, not in acute distress Cardiovascular: RRR, S1/S2 +, no rubs, no gallops Respiratory: CTA bilaterally, no wheezing, no rhonchi Abdominal: Soft, NT, ND, bowel sounds + Extremities: no edema, no cyanosis   The results of significant diagnostics from this hospitalization (including imaging, microbiology, ancillary and laboratory) are listed below for reference.     Microbiology: Recent Results (from the past 240 hour(s))  MRSA PCR Screening     Status: None   Collection Time: 09/20/17  3:40 PM  Result Value Ref Range Status   MRSA by PCR NEGATIVE NEGATIVE Final    Comment:        The GeneXpert MRSA Assay (FDA approved for NASAL specimens only), is one component of a comprehensive MRSA colonization surveillance program. It is not intended to diagnose MRSA infection nor to guide or monitor treatment for MRSA infections.      Labs: BNP (last 3 results) No results for input(s): BNP in the last 8760 hours. Basic Metabolic Panel:  Recent Labs Lab 09/20/17 0815 09/21/17 0448 09/22/17 0449  NA 139 142 142  K 3.8 3.2* 3.6  CL 102 109 109  CO2 26 24 24   GLUCOSE 140* 110* 104*  BUN 18 14 11   CREATININE 0.81 0.66 0.49*  CALCIUM 9.7 8.6* 8.9  MG  --  2.0 1.8   Liver Function  Tests:  Recent Labs Lab 09/20/17 0815 09/21/17 0448 09/22/17 0449  AST 203* 136* 148*  ALT 203* 153* 150*  ALKPHOS 46 35* 34*  BILITOT 1.8* 1.6* 1.2  PROT 8.3* 6.9  7.0  ALBUMIN 4.3 3.7 3.7   No results for input(s): LIPASE, AMYLASE in the last 168 hours.  Recent Labs Lab 09/20/17 0929  AMMONIA 25   CBC:  Recent Labs Lab 09/20/17 0815 09/21/17 0448 09/22/17 0449  WBC 14.9* 13.3* 11.9*  NEUTROABS 11.7* 8.7* 7.3  HGB 16.8 14.4 14.4  HCT 48.1 41.9 42.4  MCV 98.6 98.4 99.8  PLT 234 202 203   Cardiac Enzymes:  Recent Labs Lab 09/20/17 0815  TROPONINI <0.03   BNP: Invalid input(s): POCBNP CBG:  Recent Labs Lab 09/20/17 0758  GLUCAP 193*   D-Dimer No results for input(s): DDIMER in the last 72 hours. Hgb A1c No results for input(s): HGBA1C in the last 72 hours. Lipid Profile No results for input(s): CHOL, HDL, LDLCALC, TRIG, CHOLHDL, LDLDIRECT in the last 72 hours. Thyroid function studies No results for input(s): TSH, T4TOTAL, T3FREE, THYROIDAB in the last 72 hours.  Invalid input(s): FREET3 Anemia work up No results for input(s): VITAMINB12, FOLATE, FERRITIN, TIBC, IRON, RETICCTPCT in the last 72 hours. Urinalysis    Component Value Date/Time   COLORURINE AMBER (A) 09/20/2017 0803   APPEARANCEUR CLOUDY (A) 09/20/2017 0803   LABSPEC 1.019 09/20/2017 0803   PHURINE 5.0 09/20/2017 0803   GLUCOSEU NEGATIVE 09/20/2017 0803   HGBUR MODERATE (A) 09/20/2017 0803   BILIRUBINUR NEGATIVE 09/20/2017 0803   KETONESUR NEGATIVE 09/20/2017 0803   PROTEINUR 100 (A) 09/20/2017 0803   NITRITE NEGATIVE 09/20/2017 0803   LEUKOCYTESUR NEGATIVE 09/20/2017 0803   Sepsis Labs Invalid input(s): PROCALCITONIN,  WBC,  LACTICIDVEN Microbiology Recent Results (from the past 240 hour(s))  MRSA PCR Screening     Status: None   Collection Time: 09/20/17  3:40 PM  Result Value Ref Range Status   MRSA by PCR NEGATIVE NEGATIVE Final    Comment:        The GeneXpert MRSA  Assay (FDA approved for NASAL specimens only), is one component of a comprehensive MRSA colonization surveillance program. It is not intended to diagnose MRSA infection nor to guide or monitor treatment for MRSA infections.    Time coordinating discharge: 35 minutes  SIGNED:  Standley Dakinslanford Jinny Sweetland, MD  [facility] 09/22/2017, 9:45 AM Pager [phone]  If 7PM-7AM, please contact night-coverage [email or web address] Password [email or web address]

## 2017-11-09 ENCOUNTER — Encounter (HOSPITAL_COMMUNITY): Payer: Self-pay | Admitting: *Deleted

## 2017-11-09 ENCOUNTER — Other Ambulatory Visit: Payer: Self-pay

## 2017-11-09 ENCOUNTER — Emergency Department (HOSPITAL_COMMUNITY): Payer: Self-pay

## 2017-11-09 ENCOUNTER — Emergency Department (HOSPITAL_COMMUNITY)
Admission: EM | Admit: 2017-11-09 | Discharge: 2017-11-09 | Disposition: A | Discharge status: Home / Self Care | Payer: Self-pay | Attending: Emergency Medicine | Admitting: Emergency Medicine

## 2017-11-09 DIAGNOSIS — Z79899 Other long term (current) drug therapy: Secondary | ICD-10-CM | POA: Insufficient documentation

## 2017-11-09 DIAGNOSIS — I10 Essential (primary) hypertension: Secondary | ICD-10-CM | POA: Insufficient documentation

## 2017-11-09 DIAGNOSIS — Z7982 Long term (current) use of aspirin: Secondary | ICD-10-CM | POA: Insufficient documentation

## 2017-11-09 DIAGNOSIS — R439 Unspecified disturbances of smell and taste: Secondary | ICD-10-CM

## 2017-11-09 DIAGNOSIS — R42 Dizziness and giddiness: Secondary | ICD-10-CM | POA: Insufficient documentation

## 2017-11-09 DIAGNOSIS — F17210 Nicotine dependence, cigarettes, uncomplicated: Secondary | ICD-10-CM | POA: Insufficient documentation

## 2017-11-09 DIAGNOSIS — R438 Other disturbances of smell and taste: Secondary | ICD-10-CM | POA: Insufficient documentation

## 2017-11-09 HISTORY — DX: Dizziness and giddiness: R42

## 2017-11-09 LAB — COMPREHENSIVE METABOLIC PANEL
ALBUMIN: 4.5 g/dL (ref 3.5–5.0)
ALK PHOS: 38 U/L (ref 38–126)
ALT: 48 U/L (ref 17–63)
ANION GAP: 8 (ref 5–15)
AST: 29 U/L (ref 15–41)
BUN: 18 mg/dL (ref 6–20)
CALCIUM: 10.3 mg/dL (ref 8.9–10.3)
CO2: 26 mmol/L (ref 22–32)
Chloride: 106 mmol/L (ref 101–111)
Creatinine, Ser: 0.59 mg/dL — ABNORMAL LOW (ref 0.61–1.24)
GFR calc non Af Amer: >60 mL/min (ref >60)
GLUCOSE: 115 mg/dL — AB (ref 65–99)
POTASSIUM: 3.4 mmol/L — AB (ref 3.5–5.1)
SODIUM: 140 mmol/L (ref 135–145)
Total Bilirubin: 0.9 mg/dL (ref 0.3–1.2)
Total Protein: 8 g/dL (ref 6.5–8.1)

## 2017-11-09 LAB — CBC WITH DIFFERENTIAL/PLATELET
BASOS PCT: 0 %
Basophils Absolute: 0 10*3/uL (ref 0.0–0.1)
EOS ABS: 0 10*3/uL (ref 0.0–0.7)
Eosinophils Relative: 0 %
HEMATOCRIT: 43.6 % (ref 39.0–52.0)
HEMOGLOBIN: 15 g/dL (ref 13.0–17.0)
LYMPHS ABS: 3.9 10*3/uL (ref 0.7–4.0)
Lymphocytes Relative: 28 %
MCH: 32.9 pg (ref 26.0–34.0)
MCHC: 34.4 g/dL (ref 30.0–36.0)
MCV: 95.6 fL (ref 78.0–100.0)
MONOS PCT: 8 %
Monocytes Absolute: 1 10*3/uL (ref 0.1–1.0)
NEUTROS ABS: 8.7 10*3/uL — AB (ref 1.7–7.7)
NEUTROS PCT: 64 %
Platelets: 316 10*3/uL (ref 150–400)
RBC: 4.56 MIL/uL (ref 4.22–5.81)
RDW: 12 % (ref 11.5–15.5)
WBC: 13.7 10*3/uL — AB (ref 4.0–10.5)

## 2017-11-09 LAB — ETHANOL: Alcohol, Ethyl (B): <10 mg/dL (ref <10)

## 2017-11-09 MED ORDER — ONDANSETRON HCL 4 MG/2ML IJ SOLN
4.0000 mg | Freq: Once | INTRAMUSCULAR | Status: AC
  Administered 2017-11-09: 4 mg via INTRAVENOUS
  Filled 2017-11-09: qty 2

## 2017-11-09 MED ORDER — SODIUM CHLORIDE 0.9 % IV BOLUS (SEPSIS)
1000.0000 mL | Freq: Once | INTRAVENOUS | Status: AC
  Administered 2017-11-09: 1000 mL via INTRAVENOUS

## 2017-11-09 MED ORDER — LORAZEPAM 2 MG/ML IJ SOLN
1.0000 mg | Freq: Once | INTRAMUSCULAR | Status: AC
  Administered 2017-11-09: 1 mg via INTRAVENOUS
  Filled 2017-11-09: qty 1

## 2017-11-09 NOTE — ED Provider Notes (Signed)
[facility] EMERGENCY DEPARTMENT Provider Note   CSN: [id number] Arrival date & time: 11/09/17  8657     History   Chief Complaint Chief Complaint  Patient presents with  . Dizziness    HPI Ricky Werner is a 51 y.o. male.  HPI  51 year old male with a history of alcohol abuse and prior vertigo presents with dizziness.  He states he felt this way before.  He has been feeling dizzy for 2 full days.  It both feels like lightheadedness but also would like things are spinning.  Worse with any type of movement.  He can lay in one certain direction and will feel okay but otherwise he is dizzy.  It is hard for him to get up and walk because of it.  It all started 2 days ago when he first started noticing that things smelled poorly.  He states anything, including soap and other things typically smell normal or nice smell horrible.  Everything smells like burnt ash.  He states even water tastes like this.  Since then has had some nausea and vomiting as well as the dizziness.  Has had a progressively worsening headache since yesterday, he thinks this is because he is not eating and drinking okay.  He was admitted for an alcohol withdrawal seizure back in October and states he has not had any alcohol since.  He denies chest pain but has had some cough.  No fevers.  He has chronic left low back pain that is worse than typical and feels like it spasming.  Past Medical History:  Diagnosis Date  . Alcohol abuse   . Chronic back pain   . Hypertension   . Sciatica   . Vertigo     Patient Active Problem List   Diagnosis Date Noted  . Alcohol withdrawal seizure (HCC) 09/20/2017  . Alcohol abuse 09/20/2017  . Chronic back pain 09/20/2017  . Sciatica 09/20/2017  . Hypertension 09/20/2017  . Elevated liver enzymes 09/20/2017  . Fatty liver 09/20/2017  . Severe protein-calorie malnutrition (HCC) 09/20/2017  . Elevated bilirubin 09/20/2017  . Tobacco abuse 09/20/2017  . Benzodiazepine  dependence (HCC) 09/20/2017    History reviewed. No pertinent surgical history.     Home Medications    Prior to Admission medications   Medication Sig Start Date End Date Taking? Authorizing Provider  ALPRAZolam Prudy Feeler) 1 MG tablet Take 1 mg by mouth 3 (three) times daily as needed for anxiety (Patient states tha typically he only takes 2 a day).    [provider]  amLODipine (NORVASC) 10 MG tablet Take 10 mg by mouth daily. 12/01/14   [provider]  aspirin EC 81 MG tablet Take 81 mg by mouth daily.    [provider]  DiphenhydrAMINE HCl (ALLERGY MEDICATION PO) Take 1 tablet by mouth daily as needed.    [provider]  Hyprom-Naphaz-Polysorb-Zn Sulf (CLEAR EYES COMPLETE OP) Place 1 drop into both eyes daily.    [provider]  lisinopril (PRINIVIL,ZESTRIL) 20 MG tablet Take 20 mg by mouth 2 (two) times daily. 12/01/14   [provider]  Multiple Vitamin (MULTIVITAMIN WITH MINERALS) TABS tablet Take 1 tablet by mouth daily. 09/22/17   Johnson, Clanford L, MD  oxyCODONE-acetaminophen (PERCOCET) 10-325 MG per tablet Take 1 tablet by mouth every 4 (four) hours as needed for pain.    [provider]    Family History Family History  Problem Relation Age of Onset  . Hypertension Mother   .  Hypertension Brother   . Hypertension Other     Social History Social History   Tobacco Use  . Smoking status: Current Every Day Smoker    Packs/day: 1.50    Types: Cigarettes  . Smokeless tobacco: Never Used  Substance Use Topics  . Alcohol use: Yes    Comment: "1 pint of liquor every day"  . Drug use: Yes    Types: Marijuana    Comment: occ     Allergies   Patient has no known allergies.   Review of Systems Review of Systems  Constitutional: Negative for fever.  HENT: Negative for ear pain.   Respiratory: Positive for cough. Negative for shortness of breath.   Cardiovascular: Negative for chest pain.    Gastrointestinal: Positive for nausea and vomiting.  Musculoskeletal: Positive for back pain. Negative for neck pain and neck stiffness.  Neurological: Positive for dizziness, weakness (diffuse) and headaches.  All other systems reviewed and are negative.    Physical Exam Updated Vital Signs BP 132/85   Pulse 80   Temp 98.5 F (36.9 C)   Resp 20   Ht 5\' 5"  (1.651 m)   Wt 70.3 kg (155 lb)   SpO2 97%   BMI 25.79 kg/m   Physical Exam  Constitutional: He is oriented to person, place, and time. He appears well-developed and well-nourished. No distress.  HENT:  Head: Normocephalic and atraumatic.  Right Ear: External ear normal.  Left Ear: External ear normal.  Nose: Nose normal.  Mouth/Throat: Oropharynx is clear and moist.  Eyes: EOM are normal. Pupils are equal, round, and reactive to light. Right eye exhibits no discharge. Left eye exhibits no discharge. Right eye exhibits no nystagmus. Left eye exhibits no nystagmus.  Neck: Normal range of motion. Neck supple.  Cardiovascular: Normal rate, regular rhythm and normal heart sounds.  Pulmonary/Chest: Effort normal and breath sounds normal. He has no wheezes. He has no rales.  Abdominal: Soft. He exhibits no distension. There is no tenderness.  Musculoskeletal: He exhibits no edema.       Lumbar back: He exhibits tenderness (left lateral, lumbar). He exhibits no bony tenderness.  Neurological: He is alert and oriented to person, place, and time.  CN 3-12 grossly intact. 5/5 strength in all 4 extremities. Grossly normal sensation. Normal finger to nose.   Skin: Skin is warm and dry. He is not diaphoretic.  Nursing note and vitals reviewed.    ED Treatments / Results  Labs (all labs ordered are listed, but only abnormal results are displayed) Labs Reviewed  COMPREHENSIVE METABOLIC PANEL - Abnormal; Notable for the following components:      Result Value   Potassium 3.4 (*)    Glucose, Bld 115 (*)    Creatinine, Ser 0.59  (*)    All other components within normal limits  CBC WITH DIFFERENTIAL/PLATELET - Abnormal; Notable for the following components:   WBC 13.7 (*)    Neutro Abs 8.7 (*)    All other components within normal limits  ETHANOL    EKG  EKG Interpretation  Date/Time:  Saturday November 09 2017 19:30:56 EST Ventricular Rate:  90 PR Interval:    QRS Duration: 96 QT Interval:  381 QTC Calculation: 467 R Axis:   64 Text Interpretation:  Sinus rhythm Probable left atrial enlargement ST elev, probable normal early repol pattern Baseline wander in lead(s) V6 no significant change since Oct 2018 Confirmed by Pricilla LovelessGoldston, Nohely Whitehorn [id number]) on 11/09/2017 7:44:39 PM  Radiology Dg Chest 2 View  Result Date: 11/09/2017 CLINICAL DATA:  Cough.  Dizziness since yesterday. EXAM: CHEST  2 VIEW COMPARISON:  Radiograph 09/20/2017 FINDINGS: The cardiomediastinal contours are normal. Mild chronic bronchial thickening. Pulmonary vasculature is normal. No consolidation, pleural effusion, or pneumothorax. No acute osseous abnormalities are seen. IMPRESSION: No acute abnormality.  Mild chronic bronchitic change. Electronically Signed   By: Rubye OaksMelanie  Ehinger M.D.   On: 11/09/2017 21:10   Ct Head Wo Contrast  Result Date: 11/09/2017 CLINICAL DATA:  Dizziness since yesterday. EXAM: CT HEAD WITHOUT CONTRAST TECHNIQUE: Contiguous axial images were obtained from the base of the skull through the vertex without intravenous contrast. COMPARISON:  09/20/2017 head CT FINDINGS: BRAIN: The ventricles and sulci are normal. No intraparenchymal hemorrhage, mass effect nor midline shift. No acute large vascular territory infarcts. Grey-white matter distinction is maintained. The basal ganglia are unremarkable. No abnormal extra-axial fluid collections. Basal cisterns are not effaced and midline. The brainstem and cerebellar hemispheres are without acute abnormalities. VASCULAR: Unremarkable. SKULL/SOFT TISSUES: No skull fracture. No  significant soft tissue swelling. ORBITS/SINUSES: The included ocular globes and orbital contents are normal.The mastoid air cells are clear. The included paranasal sinuses are well-aerated. OTHER: None. IMPRESSION: Stable normal head CT. Electronically Signed   By: Tollie Ethavid  Kwon M.D.   On: 11/09/2017 20:58    Procedures Procedures (including critical care time)  Medications Ordered in ED Medications  sodium chloride 0.9 % bolus 1,000 mL (0 mLs Intravenous Stopped 11/09/17 2056)  LORazepam (ATIVAN) injection 1 mg (1 mg Intravenous Given 11/09/17 2007)  ondansetron (ZOFRAN) injection 4 mg (4 mg Intravenous Given 11/09/17 2007)     Initial Impression / Assessment and Plan / ED Course  I have reviewed the triage vital signs and the nursing notes.  Pertinent labs & imaging results that were available during my care of the patient were reviewed by me and considered in my medical decision making (see chart for details).     Patient symptoms have all improved except his abnormal sense of smell.  His only new medication recently is tramadol which she was switched to from oxycodone by his PCP.  However he states he is not taking the tramadol as much as prescribed because it makes him have odd dreams and trouble sleeping.  However his neuro exam is benign.  His vertigo is acute on chronic for him and typical for him.  I discussed his case with tele-neurology, Dr. Reggie Pileolon, who recommends outpatient MRI and workup with his PCP.  This is given 2+ days of symptoms with no significant findings on CT.  While he could have had a minor stroke, this is otherwise unlikely and he is amenable to outpatient workup.  Offered him nausea medicine but he declines.  He otherwise does not appear significantly dehydrated and his vital signs are benign and his labs are unremarkable compared to baseline.  Discharge home with return precautions.  Final Clinical Impressions(s) / ED Diagnoses   Final diagnoses:  Vertigo    Disturbance of smell    ED Discharge Orders    None       Pricilla LovelessGoldston, Alexandra Posadas, MD 11/09/17 2312

## 2017-11-09 NOTE — Discharge Instructions (Signed)
You will need an MRI as an outpatient to help confirm that there is no stroke causing her abnormal sense of smell.  Call your doctor on Monday to help set this up and to set up a follow-up appointment.  Return to the ER if any of your symptoms worsen or increase or new symptoms develop.

## 2017-11-09 NOTE — ED Triage Notes (Addendum)
Pt with dizziness since yesterday.  Hx of same about a month ago. Pt with lower back pain.

## 2018-10-19 ENCOUNTER — Other Ambulatory Visit: Payer: Self-pay

## 2018-10-19 ENCOUNTER — Emergency Department (HOSPITAL_COMMUNITY): Payer: Self-pay

## 2018-10-19 ENCOUNTER — Emergency Department (HOSPITAL_COMMUNITY)
Admission: EM | Admit: 2018-10-19 | Discharge: 2018-10-19 | Disposition: A | Discharge status: Home / Self Care | Payer: Self-pay | Attending: Emergency Medicine | Admitting: Emergency Medicine

## 2018-10-19 ENCOUNTER — Encounter (HOSPITAL_COMMUNITY): Payer: Self-pay | Admitting: Emergency Medicine

## 2018-10-19 DIAGNOSIS — R197 Diarrhea, unspecified: Secondary | ICD-10-CM

## 2018-10-19 DIAGNOSIS — R112 Nausea with vomiting, unspecified: Secondary | ICD-10-CM | POA: Insufficient documentation

## 2018-10-19 DIAGNOSIS — R1084 Generalized abdominal pain: Secondary | ICD-10-CM

## 2018-10-19 DIAGNOSIS — I10 Essential (primary) hypertension: Secondary | ICD-10-CM | POA: Insufficient documentation

## 2018-10-19 DIAGNOSIS — R05 Cough: Secondary | ICD-10-CM | POA: Insufficient documentation

## 2018-10-19 DIAGNOSIS — F17210 Nicotine dependence, cigarettes, uncomplicated: Secondary | ICD-10-CM | POA: Insufficient documentation

## 2018-10-19 DIAGNOSIS — Z79899 Other long term (current) drug therapy: Secondary | ICD-10-CM | POA: Insufficient documentation

## 2018-10-19 DIAGNOSIS — K8020 Calculus of gallbladder without cholecystitis without obstruction: Secondary | ICD-10-CM | POA: Insufficient documentation

## 2018-10-19 DIAGNOSIS — R059 Cough, unspecified: Secondary | ICD-10-CM

## 2018-10-19 DIAGNOSIS — Z7982 Long term (current) use of aspirin: Secondary | ICD-10-CM | POA: Insufficient documentation

## 2018-10-19 HISTORY — DX: Anxiety disorder, unspecified: F41.9

## 2018-10-19 HISTORY — DX: Headache: R51

## 2018-10-19 HISTORY — DX: Panic disorder (episodic paroxysmal anxiety): F41.0

## 2018-10-19 HISTORY — DX: Headache, unspecified: R51.9

## 2018-10-19 LAB — HEPATIC FUNCTION PANEL
ALBUMIN: 4.8 g/dL (ref 3.5–5.0)
ALK PHOS: 28 U/L — AB (ref 38–126)
ALT: 14 U/L (ref 0–44)
AST: 15 U/L (ref 15–41)
BILIRUBIN TOTAL: 0.8 mg/dL (ref 0.3–1.2)
Bilirubin, Direct: 0.1 mg/dL (ref 0.0–0.2)
Indirect Bilirubin: 0.7 mg/dL (ref 0.3–0.9)
Total Protein: 8.4 g/dL — ABNORMAL HIGH (ref 6.5–8.1)

## 2018-10-19 LAB — BASIC METABOLIC PANEL
ANION GAP: 10 (ref 5–15)
BUN: 12 mg/dL (ref 6–20)
CALCIUM: 9.9 mg/dL (ref 8.9–10.3)
CHLORIDE: 105 mmol/L (ref 98–111)
CO2: 23 mmol/L (ref 22–32)
Creatinine, Ser: 0.63 mg/dL (ref 0.61–1.24)
GFR calc non Af Amer: >60 mL/min (ref >60)
Glucose, Bld: 120 mg/dL — ABNORMAL HIGH (ref 70–99)
Potassium: 3.6 mmol/L (ref 3.5–5.1)
SODIUM: 138 mmol/L (ref 135–145)

## 2018-10-19 LAB — DIFFERENTIAL
Abs Immature Granulocytes: 0.05 10*3/uL (ref 0.00–0.07)
Basophils Absolute: 0 10*3/uL (ref 0.0–0.1)
Basophils Relative: 0 %
EOS PCT: 0 %
Eosinophils Absolute: 0 10*3/uL (ref 0.0–0.5)
Immature Granulocytes: 0 %
LYMPHS ABS: 2.7 10*3/uL (ref 0.7–4.0)
Lymphocytes Relative: 24 %
MONO ABS: 0.7 10*3/uL (ref 0.1–1.0)
MONOS PCT: 7 %
NEUTROS PCT: 69 %
Neutro Abs: 7.9 10*3/uL — ABNORMAL HIGH (ref 1.7–7.7)

## 2018-10-19 LAB — CBC
HCT: 43.5 % (ref 39.0–52.0)
Hemoglobin: 14.4 g/dL (ref 13.0–17.0)
MCH: 31.6 pg (ref 26.0–34.0)
MCHC: 33.1 g/dL (ref 30.0–36.0)
MCV: 95.4 fL (ref 80.0–100.0)
NRBC: 0 % (ref 0.0–0.2)
PLATELETS: 337 10*3/uL (ref 150–400)
RBC: 4.56 MIL/uL (ref 4.22–5.81)
RDW: 11.9 % (ref 11.5–15.5)
WBC: 11.5 10*3/uL — AB (ref 4.0–10.5)

## 2018-10-19 LAB — INFLUENZA PANEL BY PCR (TYPE A & B)
INFLBPCR: NEGATIVE
Influenza A By PCR: NEGATIVE

## 2018-10-19 LAB — URINALYSIS, ROUTINE W REFLEX MICROSCOPIC
Bilirubin Urine: NEGATIVE
Glucose, UA: NEGATIVE mg/dL
Hgb urine dipstick: NEGATIVE
KETONES UR: 5 mg/dL — AB
LEUKOCYTES UA: NEGATIVE
NITRITE: NEGATIVE
PROTEIN: NEGATIVE mg/dL
Specific Gravity, Urine: >1.046 — ABNORMAL HIGH (ref 1.005–1.030)
pH: 7 (ref 5.0–8.0)

## 2018-10-19 LAB — LIPASE, BLOOD: Lipase: 37 U/L (ref 11–51)

## 2018-10-19 LAB — TROPONIN I

## 2018-10-19 LAB — CBG MONITORING, ED: GLUCOSE-CAPILLARY: 116 mg/dL — AB (ref 70–99)

## 2018-10-19 MED ORDER — ONDANSETRON HCL 4 MG/2ML IJ SOLN
4.0000 mg | INTRAMUSCULAR | Status: DC | PRN
  Administered 2018-10-19: 4 mg via INTRAVENOUS
  Filled 2018-10-19: qty 2

## 2018-10-19 MED ORDER — ONDANSETRON 4 MG PO TBDP: 4.0000 mg | ORAL_TABLET | Freq: Three times a day (TID) | ORAL | 0 refills | Status: DC | PRN

## 2018-10-19 MED ORDER — BENZONATATE 100 MG PO CAPS: 100.0000 mg | ORAL_CAPSULE | Freq: Three times a day (TID) | ORAL | 0 refills | Status: DC | PRN

## 2018-10-19 MED ORDER — IOPAMIDOL (ISOVUE-300) INJECTION 61%
100.0000 mL | Freq: Once | INTRAVENOUS | Status: AC | PRN
  Administered 2018-10-19: 100 mL via INTRAVENOUS

## 2018-10-19 MED ORDER — SODIUM CHLORIDE 0.9 % IV BOLUS
1000.0000 mL | Freq: Once | INTRAVENOUS | Status: AC
  Administered 2018-10-19: 1000 mL via INTRAVENOUS

## 2018-10-19 MED ORDER — FAMOTIDINE IN NACL 20-0.9 MG/50ML-% IV SOLN
20.0000 mg | Freq: Once | INTRAVENOUS | Status: AC
  Administered 2018-10-19: 20 mg via INTRAVENOUS
  Filled 2018-10-19: qty 50

## 2018-10-19 NOTE — ED Triage Notes (Addendum)
Patient has multiple complaints. Per patient cough with body aches that started Thursday. Per patient cough was productive yesterday with thick yellow sputum, clear today. Unsure of any fevers. Patient also reports nausea, vomiting, and diarrhea. Patient states recently taken of xanax and placed on different type of medication with possible withdrawal symptoms. Patient also reports dizziness and possible syncopal episode last night, unsure. Patient c/o right side pain.

## 2018-10-19 NOTE — Discharge Instructions (Addendum)
Take the prescriptions as directed.  Increase your fluid intake (ie:  Gatoraide) for the next few days.  Eat a bland diet and advance to your regular diet slowly as you can tolerate it.  Avoid full strength juices, as well as milk and milk products until your diarrhea has resolved.Eat a bland diet, avoiding greasy, fatty, fried foods, as well as spicy and acidic foods or beverages.  Avoid eating within 2 to 3 hours before going to bed or laying down.  Also avoid teas, colas, coffee, chocolate, pepermint and spearment.  Call your regular medical doctor Monday to schedule a follow up appointment this week.  Return to the Emergency Department immediately sooner if worsening.

## 2018-10-19 NOTE — ED Provider Notes (Signed)
[facility] EMERGENCY DEPARTMENT Provider Note   CSN: [id number] Arrival date & time: 10/19/18  1117     History   Chief Complaint Chief Complaint  Patient presents with  . Emesis  . Loss of Consciousness    HPI Ricky Werner is a 52 y.o. male.   Emesis    Loss of Consciousness   Associated symptoms include vomiting.    Pt was seen at 1210.  Per pt, c/o gradual onset and persistence of multiple intermittent episodes of N/V/D that began 3 days ago.   Has been associated with vague generalized abd "pains," generalized body aches, cough, runny/stuffy nose, sinus congestion. Denies sore throat, no CP/SOB, no dysuria/hematuria, no fevers, no rash, no black or blood in stools or emesis.    Past Medical History:  Diagnosis Date  . Alcohol abuse   . Anxiety   . Chronic back pain   . Headache   . Hypertension   . Panic attacks   . Sciatica   . Vertigo     Patient Active Problem List   Diagnosis Date Noted  . Alcohol withdrawal seizure (HCC) 09/20/2017  . Alcohol abuse 09/20/2017  . Chronic back pain 09/20/2017  . Sciatica 09/20/2017  . Hypertension 09/20/2017  . Elevated liver enzymes 09/20/2017  . Fatty liver 09/20/2017  . Severe protein-calorie malnutrition (HCC) 09/20/2017  . Elevated bilirubin 09/20/2017  . Tobacco abuse 09/20/2017  . Benzodiazepine dependence (HCC) 09/20/2017    History reviewed. No pertinent surgical history.      Home Medications    Prior to Admission medications   Medication Sig Start Date End Date Taking? Authorizing Provider  ALPRAZolam Prudy Feeler) 1 MG tablet Take 1 mg by mouth 3 (three) times daily as needed for anxiety (Patient states tha typically he only takes 2 a day).    [provider]  amLODipine (NORVASC) 10 MG tablet Take 10 mg by mouth daily. 12/01/14   [provider]  aspirin EC 81 MG tablet Take 81 mg by mouth daily.    [provider]  DiphenhydrAMINE HCl (ALLERGY MEDICATION PO) Take 1 tablet  by mouth daily as needed.    [provider]  Hyprom-Naphaz-Polysorb-Zn Sulf (CLEAR EYES COMPLETE OP) Place 1 drop into both eyes daily.    [provider]  lisinopril (PRINIVIL,ZESTRIL) 20 MG tablet Take 20 mg by mouth 2 (two) times daily. 12/01/14   [provider]  Multiple Vitamin (MULTIVITAMIN WITH MINERALS) TABS tablet Take 1 tablet by mouth daily. 09/22/17   Johnson, Clanford L, MD  oxyCODONE-acetaminophen (PERCOCET) 10-325 MG per tablet Take 1 tablet by mouth every 4 (four) hours as needed for pain.    [provider]    Family History Family History  Problem Relation Age of Onset  . Hypertension Mother   . Hypertension Brother   . Hypertension Other     Social History Social History   Tobacco Use  . Smoking status: Current Every Day Smoker    Packs/day: 1.50    Types: Cigarettes  . Smokeless tobacco: Never Used  Substance Use Topics  . Alcohol use: Yes    Alcohol/week: 6.0 standard drinks    Types: 6 Cans of beer per week  . Drug use: Yes    Types: Marijuana    Comment: occ     Allergies   Patient has no known allergies.   Review of Systems Review of Systems  Cardiovascular: Positive for syncope.  Gastrointestinal: Positive for vomiting.  ROS: Statement: All  systems negative except as marked or noted in the HPI; Constitutional: Negative for fever and chills. +generalized body aches.; ; Eyes: Negative for eye pain, redness and discharge. ; ; ENMT: Negative for ear pain, hoarseness, sore throat, +rhinorrhea, nasal congestion, sinus pressure. ; ; Cardiovascular: Negative for chest pain, palpitations, diaphoresis, dyspnea and peripheral edema. ; ; Respiratory: +cough. Negative for wheezing and stridor. ; ; Gastrointestinal: +N/V/D, abd pain. Negative for blood in stool, hematemesis, jaundice and rectal bleeding. . ; ; Genitourinary: Negative for dysuria, flank pain and hematuria. ; ; Musculoskeletal: Negative for neck pain. Negative for  swelling and trauma.; ; Skin: Negative for pruritus, rash, abrasions, blisters, bruising and skin lesion.; ; Neuro: Negative for headache, lightheadedness and neck stiffness. Negative for weakness, altered level of consciousness, altered mental status, extremity weakness, paresthesias, involuntary movement, seizure and syncope.      Physical Exam Updated Vital Signs BP (!) 133/106 (BP Location: Right Arm)   Pulse 91   Temp 97.8 F (36.6 C) (Oral)   Resp 17   Ht 5\' 5"  (1.651 m)   Wt 62.7 kg   SpO2 97%   BMI 22.99 kg/m   12:31 Orthostatic Vital Signs AM  Orthostatic Lying   BP- Lying: 136/85   Pulse- Lying: 70       Orthostatic Sitting  BP- Sitting: 126/86   Pulse- Sitting: 83       Orthostatic Standing at 0 minutes  BP- Standing at 0 minutes: 140/85   Pulse- Standing at 0 minutes: 87       Physical Exam 1215: Physical examination:  Nursing notes reviewed; Vital signs and O2 SAT reviewed;  Constitutional: Well developed, Well nourished, Well hydrated, In no acute distress; Head:  Normocephalic, atraumatic; Eyes: EOMI, PERRL, No scleral icterus; ENMT: TM's clear bilat. +edemetous nasal turbinates bilat with clear rhinorrhea. Mouth and pharynx without lesions. No tonsillar exudates. No intra-oral edema. No submandibular or sublingual edema. No hoarse voice, no drooling, no stridor. No pain with manipulation of larynx. No trismus. Mouth and pharynx normal, Mucous membranes moist; Neck: Supple, Full range of motion, No lymphadenopathy; Cardiovascular: Regular rate and rhythm, No gallop; Respiratory: Breath sounds clear & equal bilaterally, No wheezes.  Speaking full sentences with ease, Normal respiratory effort/excursion; Chest: Nontender, Movement normal; Abdomen: Soft, +mild diffuse tenderness to palp. No rebound or guarding. Nondistended, Normal bowel sounds; Genitourinary: No CVA tenderness; Spine:  No midline CS, TS, LS tenderness. +TTP right lumbar paraspinal muscles.;;  Extremities: Peripheral pulses normal, No tenderness, No edema, No calf edema or asymmetry.; Neuro: AA&Ox3, Major CN grossly intact.  Speech clear. No gross focal motor or sensory deficits in extremities.; Skin: Color normal, Warm, Dry.   ED Treatments / Results  Labs (all labs ordered are listed, but only abnormal results are displayed)   EKG None  Radiology   Procedures Procedures (including critical care time)  Medications Ordered in ED Medications  sodium chloride 0.9 % bolus 1,000 mL (has no administration in time range)  famotidine (PEPCID) IVPB 20 mg premix (has no administration in time range)  ondansetron (ZOFRAN) injection 4 mg (4 mg Intravenous Given 10/19/18 1259)  iopamidol (ISOVUE-300) 61 % injection 100 mL (100 mLs Intravenous Contrast Given 10/19/18 1239)     Initial Impression / Assessment and Plan / ED Course  I have reviewed the triage vital signs and the nursing notes.  Pertinent labs & imaging results that were available during my care of the patient were reviewed by me and considered in my  medical decision making (see chart for details).  MDM Reviewed: previous chart, nursing note and vitals Reviewed previous: labs and ECG Interpretation: labs, ECG, x-ray and CT scan    Results for orders placed or performed during the hospital encounter of 10/19/18  Basic metabolic panel  Result Value Ref Range   Sodium 138 135 - 145 mmol/L   Potassium 3.6 3.5 - 5.1 mmol/L   Chloride 105 98 - 111 mmol/L   CO2 23 22 - 32 mmol/L   Glucose, Bld 120 (H) 70 - 99 mg/dL   BUN 12 6 - 20 mg/dL   Creatinine, Ser 1.610.63 0.61 - 1.24 mg/dL   Calcium 9.9 8.9 - 09.610.3 mg/dL   GFR calc non Af Amer >60 >60 mL/min   GFR calc Af Amer >60 >60 mL/min   Anion gap 10 5 - 15  CBC  Result Value Ref Range   WBC 11.5 (H) 4.0 - 10.5 K/uL   RBC 4.56 4.22 - 5.81 MIL/uL   Hemoglobin 14.4 13.0 - 17.0 g/dL   HCT 04.543.5 40.939.0 - 81.152.0 %   MCV 95.4 80.0 - 100.0 fL   MCH 31.6 26.0 - 34.0 pg    MCHC 33.1 30.0 - 36.0 g/dL   RDW 91.411.9 78.211.5 - 95.615.5 %   Platelets 337 150 - 400 K/uL   nRBC 0.0 0.0 - 0.2 %  Urinalysis, Routine w reflex microscopic  Result Value Ref Range   Color, Urine YELLOW YELLOW   APPearance HAZY (A) CLEAR   Specific Gravity, Urine >1.046 (H) 1.005 - 1.030   pH 7.0 5.0 - 8.0   Glucose, UA NEGATIVE NEGATIVE mg/dL   Hgb urine dipstick NEGATIVE NEGATIVE   Bilirubin Urine NEGATIVE NEGATIVE   Ketones, ur 5 (A) NEGATIVE mg/dL   Protein, ur NEGATIVE NEGATIVE mg/dL   Nitrite NEGATIVE NEGATIVE   Leukocytes, UA NEGATIVE NEGATIVE  Troponin I - Once  Result Value Ref Range   Troponin I <0.03 <0.03 ng/mL  Lipase, blood  Result Value Ref Range   Lipase 37 11 - 51 U/L  Differential  Result Value Ref Range   Neutrophils Relative % 69 %   Neutro Abs 7.9 (H) 1.7 - 7.7 K/uL   Lymphocytes Relative 24 %   Lymphs Abs 2.7 0.7 - 4.0 K/uL   Monocytes Relative 7 %   Monocytes Absolute 0.7 0.1 - 1.0 K/uL   Eosinophils Relative 0 %   Eosinophils Absolute 0.0 0.0 - 0.5 K/uL   Basophils Relative 0 %   Basophils Absolute 0.0 0.0 - 0.1 K/uL   Immature Granulocytes 0 %   Abs Immature Granulocytes 0.05 0.00 - 0.07 K/uL  Hepatic function panel  Result Value Ref Range   Total Protein 8.4 (H) 6.5 - 8.1 g/dL   Albumin 4.8 3.5 - 5.0 g/dL   AST 15 15 - 41 U/L   ALT 14 0 - 44 U/L   Alkaline Phosphatase 28 (L) 38 - 126 U/L   Total Bilirubin 0.8 0.3 - 1.2 mg/dL   Bilirubin, Direct 0.1 0.0 - 0.2 mg/dL   Indirect Bilirubin 0.7 0.3 - 0.9 mg/dL  Influenza panel by PCR (type A & B)  Result Value Ref Range   Influenza A By PCR NEGATIVE NEGATIVE   Influenza B By PCR NEGATIVE NEGATIVE  CBG monitoring, ED  Result Value Ref Range   Glucose-Capillary 116 (H) 70 - 99 mg/dL   Dg Chest 2 View Result Date: 10/19/2018 CLINICAL DATA:  Multiple complaints. Cough and body aches.  Nausea, vomiting and diarrhea. EXAM: CHEST - 2 VIEW COMPARISON:  11/09/2017 and 09/20/2017 radiographs. FINDINGS: The  heart size and mediastinal contours are normal. The lungs are clear. There is no pleural effusion or pneumothorax. No acute osseous findings are identified. Old left clavicle fracture noted. Telemetry leads overlie the chest. IMPRESSION: Stable chest.  No active cardiopulmonary process. Electronically Signed   By: Carey Bullocks M.D.   On: 10/19/2018 13:28   Ct Abdomen Pelvis W Contrast Result Date: 10/19/2018 CLINICAL DATA:  52 year old male with acute RIGHT abdominal and pelvic pain for 3 days. EXAM: CT ABDOMEN AND PELVIS WITH CONTRAST TECHNIQUE: Multidetector CT imaging of the abdomen and pelvis was performed using the standard protocol following bolus administration of intravenous contrast. CONTRAST:  ISOVUE-300 IOPAMIDOL (ISOVUE-300) INJECTION 61% COMPARISON:  06/03/2014 CT FINDINGS: Lower chest: Unremarkable Hepatobiliary: Multiple gallstones are identified. No CT evidence of acute cholecystitis noted. The liver is unremarkable. No biliary dilatation. Pancreas: Unremarkable Spleen: Unremarkable Adrenals/Urinary Tract: The kidneys, adrenal glands and bladder are unremarkable. Stomach/Bowel: Stomach is within normal limits. Appendix appears normal. No evidence of bowel wall thickening, distention, or inflammatory changes. Vascular/Lymphatic: Aortic atherosclerosis. No enlarged abdominal or pelvic lymph nodes. Reproductive: Prostate is unremarkable. Other: No ascites, abscess or pneumoperitoneum. No abdominal wall hernia. Musculoskeletal: No acute or suspicious bony abnormalities. IMPRESSION: 1. No evidence of acute abnormality. 2. Cholelithiasis without CT evidence of acute cholecystitis. If there is strong clinical suspicion for acute cholecystitis, consider ultrasound or nuclear medicine study. 3.  Aortic Atherosclerosis (ICD10-I70.0). Electronically Signed   By: Harmon Pier M.D.   On: 10/19/2018 13:37     1515:  Workup reassuring. Pt has tol PO well while in the ED without N/V.  No stooling  while in the ED.  Abd benign, resps easy, VSS. Feels better and wants to go home now. Tx symptomatically at this time. Dx and testing d/w pt.  Questions answered.  Verb understanding, agreeable to d/c home with outpt f/u.     Final Clinical Impressions(s) / ED Diagnoses   Final diagnoses:  None    ED Discharge Orders    None       Samuel Jester, DO 10/23/18 1615

## 2018-11-24 ENCOUNTER — Other Ambulatory Visit: Payer: Self-pay

## 2018-11-24 ENCOUNTER — Emergency Department (HOSPITAL_COMMUNITY)
Admission: EM | Admit: 2018-11-24 | Discharge: 2018-11-24 | Disposition: A | Discharge status: Home / Self Care | Payer: Self-pay | Attending: Emergency Medicine | Admitting: Emergency Medicine

## 2018-11-24 ENCOUNTER — Encounter (HOSPITAL_COMMUNITY): Payer: Self-pay | Admitting: Emergency Medicine

## 2018-11-24 DIAGNOSIS — Z7982 Long term (current) use of aspirin: Secondary | ICD-10-CM | POA: Insufficient documentation

## 2018-11-24 DIAGNOSIS — R112 Nausea with vomiting, unspecified: Secondary | ICD-10-CM | POA: Insufficient documentation

## 2018-11-24 DIAGNOSIS — I10 Essential (primary) hypertension: Secondary | ICD-10-CM | POA: Insufficient documentation

## 2018-11-24 DIAGNOSIS — Z79899 Other long term (current) drug therapy: Secondary | ICD-10-CM | POA: Insufficient documentation

## 2018-11-24 DIAGNOSIS — F17210 Nicotine dependence, cigarettes, uncomplicated: Secondary | ICD-10-CM | POA: Insufficient documentation

## 2018-11-24 DIAGNOSIS — R197 Diarrhea, unspecified: Secondary | ICD-10-CM | POA: Insufficient documentation

## 2018-11-24 LAB — URINALYSIS, ROUTINE W REFLEX MICROSCOPIC
Bilirubin Urine: NEGATIVE
Glucose, UA: NEGATIVE mg/dL
Hgb urine dipstick: NEGATIVE
Ketones, ur: 5 mg/dL — AB
Leukocytes, UA: NEGATIVE
Nitrite: NEGATIVE
Protein, ur: NEGATIVE mg/dL
Specific Gravity, Urine: 1.02 (ref 1.005–1.030)
pH: 5 (ref 5.0–8.0)

## 2018-11-24 LAB — COMPREHENSIVE METABOLIC PANEL
ALT: 13 U/L (ref 0–44)
AST: 14 U/L — ABNORMAL LOW (ref 15–41)
Albumin: 4.7 g/dL (ref 3.5–5.0)
Alkaline Phosphatase: 27 U/L — ABNORMAL LOW (ref 38–126)
Anion gap: 10 (ref 5–15)
BUN: 17 mg/dL (ref 6–20)
CO2: 25 mmol/L (ref 22–32)
Calcium: 9.9 mg/dL (ref 8.9–10.3)
Chloride: 105 mmol/L (ref 98–111)
Creatinine, Ser: 0.56 mg/dL — ABNORMAL LOW (ref 0.61–1.24)
GFR calc Af Amer: >60 mL/min (ref >60)
GFR calc non Af Amer: >60 mL/min (ref >60)
Glucose, Bld: 118 mg/dL — ABNORMAL HIGH (ref 70–99)
Potassium: 3.9 mmol/L (ref 3.5–5.1)
Sodium: 140 mmol/L (ref 135–145)
Total Bilirubin: 0.7 mg/dL (ref 0.3–1.2)
Total Protein: 8.2 g/dL — ABNORMAL HIGH (ref 6.5–8.1)

## 2018-11-24 LAB — CBC WITH DIFFERENTIAL/PLATELET
Abs Immature Granulocytes: 0.04 10*3/uL (ref 0.00–0.07)
Basophils Absolute: 0 10*3/uL (ref 0.0–0.1)
Basophils Relative: 0 %
Eosinophils Absolute: 0 10*3/uL (ref 0.0–0.5)
Eosinophils Relative: 0 %
HCT: 46.5 % (ref 39.0–52.0)
Hemoglobin: 15.7 g/dL (ref 13.0–17.0)
Immature Granulocytes: 0 %
Lymphocytes Relative: 19 %
Lymphs Abs: 2.1 10*3/uL (ref 0.7–4.0)
MCH: 32.2 pg (ref 26.0–34.0)
MCHC: 33.8 g/dL (ref 30.0–36.0)
MCV: 95.5 fL (ref 80.0–100.0)
Monocytes Absolute: 0.5 10*3/uL (ref 0.1–1.0)
Monocytes Relative: 5 %
Neutro Abs: 8.7 10*3/uL — ABNORMAL HIGH (ref 1.7–7.7)
Neutrophils Relative %: 76 %
Platelets: 290 10*3/uL (ref 150–400)
RBC: 4.87 MIL/uL (ref 4.22–5.81)
RDW: 12.3 % (ref 11.5–15.5)
WBC: 11.4 10*3/uL — ABNORMAL HIGH (ref 4.0–10.5)
nRBC: 0 % (ref 0.0–0.2)

## 2018-11-24 LAB — LIPASE, BLOOD: Lipase: 36 U/L (ref 11–51)

## 2018-11-24 MED ORDER — ONDANSETRON HCL 4 MG PO TABS: 4.0000 mg | ORAL_TABLET | Freq: Three times a day (TID) | ORAL | 0 refills | Status: DC | PRN

## 2018-11-24 MED ORDER — LACTATED RINGERS IV BOLUS
1000.0000 mL | Freq: Once | INTRAVENOUS | Status: AC
  Administered 2018-11-24: 1000 mL via INTRAVENOUS

## 2018-11-24 MED ORDER — METOCLOPRAMIDE HCL 5 MG/ML IJ SOLN
10.0000 mg | Freq: Once | INTRAMUSCULAR | Status: AC
  Administered 2018-11-24: 10 mg via INTRAVENOUS
  Filled 2018-11-24: qty 2

## 2018-11-24 MED ORDER — DIPHENHYDRAMINE HCL 50 MG/ML IJ SOLN
25.0000 mg | Freq: Once | INTRAMUSCULAR | Status: AC
  Administered 2018-11-24: 25 mg via INTRAVENOUS
  Filled 2018-11-24: qty 1

## 2018-11-24 MED ORDER — KETOROLAC TROMETHAMINE 30 MG/ML IJ SOLN
15.0000 mg | Freq: Once | INTRAMUSCULAR | Status: AC
  Administered 2018-11-24: 15 mg via INTRAVENOUS
  Filled 2018-11-24: qty 1

## 2018-11-24 NOTE — ED Triage Notes (Signed)
Pt c/o n/v/d/unable to sleep and dizziness x 3 days

## 2018-11-24 NOTE — ED Provider Notes (Signed)
[facility] EMERGENCY DEPARTMENT Provider Note   CSN: [id number] Arrival date & time: 11/24/18  1415     History   Chief Complaint Chief Complaint  Patient presents with  . Emesis    HPI Ricky Werner is a 52 y.o. male.  HPI   52 year old male with nausea, vomiting and diarrhea.  Symptom onset Friday.  Persistent since then.  Diffuse crampy abdominal pain which comes and goes.  He reports innumerable episodes of vomiting diarrhea.  No blood in stool or emesis.  Some subjective fevers.  Did not actually take his temperature.  He lives alone.  Denies any known sick contacts.  No acute urinary complaints.  Denies past history of abdominal surgery.  Past Medical History:  Diagnosis Date  . Alcohol abuse   . Anxiety   . Chronic back pain   . Headache   . Hypertension   . Panic attacks   . Sciatica   . Vertigo     Patient Active Problem List   Diagnosis Date Noted  . Alcohol withdrawal seizure (HCC) 09/20/2017  . Alcohol abuse 09/20/2017  . Chronic back pain 09/20/2017  . Sciatica 09/20/2017  . Hypertension 09/20/2017  . Elevated liver enzymes 09/20/2017  . Fatty liver 09/20/2017  . Severe protein-calorie malnutrition (HCC) 09/20/2017  . Elevated bilirubin 09/20/2017  . Tobacco abuse 09/20/2017  . Benzodiazepine dependence (HCC) 09/20/2017    History reviewed. No pertinent surgical history.      Home Medications    Prior to Admission medications   Medication Sig Start Date End Date Taking? Authorizing Provider  amLODipine (NORVASC) 10 MG tablet Take 10 mg by mouth daily. 12/01/14   [provider]  aspirin EC 81 MG tablet Take 81 mg by mouth daily.    [provider]  benzonatate (TESSALON) 100 MG capsule Take 1 capsule (100 mg total) by mouth 3 (three) times daily as needed for cough. 10/19/18   Samuel JesterMcManus, Kathleen, DO  busPIRone (BUSPAR) 10 MG tablet Take 20 mg by mouth 2 (two) times daily.    [provider]    Hyprom-Naphaz-Polysorb-Zn Sulf (CLEAR EYES COMPLETE OP) Place 1 drop into both eyes daily.    [provider]  ibuprofen (ADVIL,MOTRIN) 600 MG tablet Take 600 mg by mouth every 6 (six) hours as needed.    [provider]  lisinopril (PRINIVIL,ZESTRIL) 20 MG tablet Take 20 mg by mouth 2 (two) times daily. 12/01/14   [provider]  Multiple Vitamin (MULTIVITAMIN WITH MINERALS) TABS tablet Take 1 tablet by mouth daily. 09/22/17   Johnson, Clanford L, MD  ondansetron (ZOFRAN ODT) 4 MG disintegrating tablet Take 1 tablet (4 mg total) by mouth every 8 (eight) hours as needed for nausea or vomiting. 10/19/18   Samuel JesterMcManus, Kathleen, DO  oxyCODONE-acetaminophen (PERCOCET) 10-325 MG per tablet Take 1 tablet by mouth every 4 (four) hours as needed for pain.    [provider]    Family History Family History  Problem Relation Age of Onset  . Hypertension Mother   . Hypertension Brother   . Hypertension Other     Social History Social History   Tobacco Use  . Smoking status: Current Every Day Smoker    Packs/day: 1.50    Types: Cigarettes  . Smokeless tobacco: Never Used  Substance Use Topics  . Alcohol use: Yes    Alcohol/week: 6.0 standard drinks    Types: 6 Cans of beer per week  . Drug use: Yes    Types:  Marijuana    Comment: occ     Allergies   Patient has no known allergies.   Review of Systems Review of Systems  All systems reviewed and negative, other than as noted in HPI.  Physical Exam Updated Vital Signs BP (!) 145/100 (BP Location: Right Arm)   Pulse 84   Temp 98 F (36.7 C) (Oral)   Resp 17   Ht 5\' 6"  (1.676 m)   Wt 63.5 kg   SpO2 98%   BMI 22.60 kg/m   Physical Exam Vitals signs and nursing note reviewed.  Constitutional:      General: He is not in acute distress.    Appearance: He is well-developed.  HENT:     Head: Normocephalic and atraumatic.  Eyes:     General:        Right eye: No discharge.        Left eye:  No discharge.     Conjunctiva/sclera: Conjunctivae normal.  Neck:     Musculoskeletal: Neck supple.  Cardiovascular:     Rate and Rhythm: Normal rate and regular rhythm.     Heart sounds: Normal heart sounds. No murmur. No friction rub. No gallop.   Pulmonary:     Effort: Pulmonary effort is normal. No respiratory distress.     Breath sounds: Normal breath sounds.  Abdominal:     General: There is no distension.     Palpations: Abdomen is soft.     Tenderness: There is abdominal tenderness.     Comments: Mild diffuse abdominal tenderness without rebound or guarding.  No distention.  Musculoskeletal:        General: No tenderness.  Skin:    General: Skin is warm and dry.  Neurological:     Mental Status: He is alert.  Psychiatric:        Behavior: Behavior normal.        Thought Content: Thought content normal.      ED Treatments / Results  Labs (all labs ordered are listed, but only abnormal results are displayed) Labs Reviewed  CBC WITH DIFFERENTIAL/PLATELET - Abnormal; Notable for the following components:      Result Value   WBC 11.4 (*)    Neutro Abs 8.7 (*)    All other components within normal limits  COMPREHENSIVE METABOLIC PANEL - Abnormal; Notable for the following components:   Glucose, Bld 118 (*)    Creatinine, Ser 0.56 (*)    Total Protein 8.2 (*)    AST 14 (*)    Alkaline Phosphatase 27 (*)    All other components within normal limits  URINALYSIS, ROUTINE W REFLEX MICROSCOPIC - Abnormal; Notable for the following components:   Ketones, ur 5 (*)    All other components within normal limits  LIPASE, BLOOD    EKG None  Radiology No results found.  Procedures Procedures (including critical care time)  Medications Ordered in ED Medications  lactated ringers bolus 1,000 mL (1,000 mLs Intravenous New Bag/Given 11/24/18 1701)  metoCLOPramide (REGLAN) injection 10 mg (10 mg Intravenous Given 11/24/18 1705)  diphenhydrAMINE (BENADRYL) injection 25 mg  (25 mg Intravenous Given 11/24/18 1704)  ketorolac (TORADOL) 30 MG/ML injection 15 mg (15 mg Intravenous Given 11/24/18 1703)     Initial Impression / Assessment and Plan / ED Course  I have reviewed the triage vital signs and the nursing notes.  Pertinent labs & imaging results that were available during my care of the patient were reviewed by me and considered in  my medical decision making (see chart for details).     52 year old male with crampy abdominal pain and nausea, vomiting diarrhea.  Reassuring abdominal exam..  Minimal mild diffuse tenderness.  He is afebrile.  Nontoxic in appearance.  Suspect this may be a viral GI illness.  Will check basic labs, hydrate and treat symptoms.  Reassess.  Feeling much better.  Headache is completely resolved.  Nausea much improved.  No new complaints.  ED work-up fairly unremarkable aside from minimal leukocytosis.  Suspect viral GI illness.  Symptomatic treatment.  Push fluids.  It has been determined that no acute conditions requiring further emergency intervention are present at this time. The patient has been advised of the diagnosis and plan. I reviewed any labs and imaging including any potential incidental findings. We have discussed signs and symptoms that warrant return to the ED and they are listed in the discharge instructions.    Final Clinical Impressions(s) / ED Diagnoses   Final diagnoses:  Nausea vomiting and diarrhea    ED Discharge Orders    None       Raeford Razor, MD 11/24/18 [phone]
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
## 2018-12-04 ENCOUNTER — Encounter: Payer: Self-pay | Admitting: Physician Assistant

## 2018-12-04 ENCOUNTER — Ambulatory Visit: Payer: Self-pay | Admitting: Physician Assistant

## 2018-12-04 VITALS — BP 122/90 | HR 110 | Temp 97.9°F | Ht 64.5 in | Wt 135.8 lb

## 2018-12-04 DIAGNOSIS — Z7689 Persons encountering health services in other specified circumstances: Secondary | ICD-10-CM

## 2018-12-04 NOTE — Progress Notes (Signed)
   BP 122/90 (BP Location: Left Arm, Patient Position: Sitting, Cuff Size: Normal)   Pulse (!) 110   Temp 97.9 F (36.6 C)   Ht 5' 4.5" (1.638 m)   Wt 135 lb 12 oz (61.6 kg)   SpO2 99%   BMI 22.94 kg/m    Subjective:    Patient ID: Eustace Pen, male    DOB: 08-Jun-1966, 53 y.o.   MRN: [id number]  HPI: SHLOMIE ANTUNEZ is a 54 y.o. male presenting on 12/04/2018 for New Patient (Initial Visit) (pt at [facility] pt states he has an appointment there on 12/08/2018. ); Dental Problem (abcess with some pain on upper right side. since 10/23/2018); and Weight Loss (pt states he weighed 160 lb 2 months ago 09/2018. pt states he had been having n/v since 10/23/2018, but has been better since getting xanax refilled 12-01-2018)   HPI   Pt has appointment today to establish care.  Pt says he currently goes to [facility] and he is going to continue going to [place] for care.  He says he mostly came here for dental and for his job.    Relevant past medical, surgical, family and social history reviewed and updated as indicated. Interim medical history since our last visit reviewed. Allergies and medications reviewed and updated.  Review of Systems  Per HPI unless specifically indicated above     Objective:    BP 122/90 (BP Location: Left Arm, Patient Position: Sitting, Cuff Size: Normal)   Pulse (!) 110   Temp 97.9 F (36.6 C)   Ht 5' 4.5" (1.638 m)   Wt 135 lb 12 oz (61.6 kg)   SpO2 99%   BMI 22.94 kg/m   Wt Readings from Last 3 Encounters:  12/04/18 135 lb 12 oz (61.6 kg)  11/28/18 135 lb 12.8 oz (61.6 kg)  11/24/18 140 lb (63.5 kg)    Physical Exam HENT:     Head: Normocephalic and atraumatic.  Pulmonary:     Effort: Pulmonary effort is normal. No respiratory distress.  Neurological:     Mental Status: He is alert and oriented to person, place, and time.  Psychiatric:        Mood and Affect: Mood normal.        Behavior: Behavior normal.          Assessment & Plan:   Encounter Diagnosis  Name Primary?  . Encounter to establish care Yes   Pt says he currently goes to [facility] and he is going to continue going to [facility] for care.   Discussed with pt that he can only have one PCP and he says he wants to stay at [facility].  He requests a letter saying that he was seen here today.  He is told that he  Can pick it up today after lunch.

## 2020-05-02 NOTE — Progress Notes (Signed)
CARDIOLOGY CONSULT NOTE       Patient ID: TAARIQ LEITZ MRN: [id number] DOB/AGE: [birth year] 54 y.o.  Admit date: (Not on file) Referring Physician: [facility]  Primary Physician: Elfredia Nevins, MD Primary Cardiologist: New Reason for Consultation: Fatigue   Active Problems:   * No active hospital problems. *   HPI:  54 y.o. with istory of anxiety, ETOH abuse, HTN depression panic attacks and seizures. Referred by Sherwood Gambler at [facility] medical for ? chest pain. No previously documented CAD Seen by primary 03/30/20 complained of chest pain Onset for 1-2 weeks Moderate pressure sensation in substernal area No associated dyspnea palpitations or syncope On xanax and buspar for anxiety.  On norvasc and zestril for BP Smokes 1 ppd Had a tick bite and was covered with doxycycline On my interview he never mentioned any chest pain   His primary complaint is malaise/fatigue and memory issues since having tick bites. He indicates being delerious He works Product manager things Has two sons one is with him on weekends. He still smokes and uses CBD oil regularly Use to smoke a lot of marijuana. Has been sober since 2018 but has history of DTls    ROS All other systems reviewed and negative except as noted above  Past Medical History:  Diagnosis Date  . Alcohol abuse   . Anxiety   . Carpal tunnel syndrome on right   . Chronic back pain   . Depression   . Headache   . History of dental problems   . Hypertension   . Panic attacks   . Right tennis elbow   . Sciatica   . Seizures (HCC)   . Vertigo     Family History  Problem Relation Age of Onset  . Hypertension Mother   . Hyperlipidemia Mother   . Diabetes Mother   . Hypertension Brother     Social History   Socioeconomic History  . Marital status: Divorced    Spouse name: Not on file  . Number of children: Not on file  . Years of education: Not on file  . Highest education level: Not on file  Occupational History  . Not  on file  Tobacco Use  . Smoking status: Current Every Day Smoker    Packs/day: 1.00    Years: 39.00    Pack years: 39.00    Types: Cigarettes  . Smokeless tobacco: Never Used  Vaping Use  . Vaping Use: Never used  Substance and Sexual Activity  . Alcohol use: Yes    Alcohol/week: 6.0 standard drinks    Types: 6 Cans of beer per week    Comment: previous heavy drinker sober since 2018. pt drinks a beer "here and there"   . Drug use: Not Currently    Types: Marijuana    Comment: occ  . Sexual activity: Not on file  Other Topics Concern  . Not on file  Social History Narrative  . Not on file   Social Determinants of Health   Financial Resource Strain:   . Difficulty of Paying Living Expenses:   Food Insecurity:   . Worried About Programme researcher, broadcasting/film/video in the Last Year:   . Barista in the Last Year:   Transportation Needs:   . Freight forwarder (Medical):   Marland Kitchen Lack of Transportation (Non-Medical):   Physical Activity:   . Days of Exercise per Week:   . Minutes of Exercise per Session:   Stress:   . Feeling  of Stress :   Social Connections:   . Frequency of Communication with Friends and Family:   . Frequency of Social Gatherings with Friends and Family:   . Attends Religious Services:   . Active Member of Clubs or Organizations:   . Attends Banker Meetings:   Marland Kitchen Marital Status:   Intimate Partner Violence:   . Fear of Current or Ex-Partner:   . Emotionally Abused:   Marland Kitchen Physically Abused:   . Sexually Abused:     No past surgical history on file.    Current Outpatient Medications:  .  ALPRAZolam (XANAX) 0.5 MG tablet, Take 0.5-1 mg by mouth 4 (four) times daily as needed., Disp: , Rfl:  .  amLODipine (NORVASC) 10 MG tablet, Take 10 mg by mouth daily., Disp: , Rfl: 6 .  Ascorbic Acid (VITAMIN C) 1000 MG tablet, Take 1,000 mg by mouth daily., Disp: , Rfl:  .  aspirin EC 81 MG tablet, Take 81 mg by mouth daily., Disp: , Rfl:  .   Hyprom-Naphaz-Polysorb-Zn Sulf (CLEAR EYES COMPLETE OP), Place 1 drop into both eyes daily as needed. , Disp: , Rfl:  .  ibuprofen (ADVIL,MOTRIN) 600 MG tablet, Take 600 mg by mouth every 6 (six) hours as needed., Disp: , Rfl:  .  lisinopril (PRINIVIL,ZESTRIL) 20 MG tablet, Take 20 mg by mouth 2 (two) times daily., Disp: , Rfl: 6 .  Multiple Vitamin (MULTIVITAMIN WITH MINERALS) TABS tablet, Take 1 tablet by mouth daily., Disp: , Rfl:  .  traMADol (ULTRAM) 50 MG tablet, Take by mouth every 6 (six) hours as needed., Disp: , Rfl:     Physical Exam: Blood pressure (!) 122/92, pulse 84, height 5' 5.5" (1.664 m), weight 151 lb 6.4 oz (68.7 kg).    Affect appropriate Healthy:  appears stated age HEENT: normal Neck supple with no adenopathy JVP normal no bruits no thyromegaly Lungs clear with no wheezing and good diaphragmatic motion Heart:  S1/S2 no murmur, no rub, gallop or click PMI normal Abdomen: benighn, BS positve, no tenderness, no AAA no bruit.  No HSM or HJR Distal pulses intact with no bruits No edema Neuro non-focal Skin warm and dry No muscular weakness   Labs:   Lab Results  Component Value Date   WBC 11.4 (H) 11/24/2018   HGB 15.7 11/24/2018   HCT 46.5 11/24/2018   MCV 95.5 11/24/2018   PLT 290 11/24/2018   No results for input(s): NA, K, CL, CO2, BUN, CREATININE, CALCIUM, PROT, BILITOT, ALKPHOS, ALT, AST, GLUCOSE in the last 168 hours.  Invalid input(s): LABALBU Lab Results  Component Value Date   TROPONINI <0.03 10/19/2018   No results found for: CHOL No results found for: HDL No results found for: LDLCALC No results found for: TRIG No results found for: CHOLHDL No results found for: LDLDIRECT    Radiology: No results found.  EKG: SR rate 85 normal 09/2018  05/10/20 NSR normal ECG    ASSESSMENT AND PLAN:   1. Chest Pain:  Atypical does not complain about this to me ECG normal no indication for stress testing  2. Anxiety/Depression:  Continue xanax  and buspar f/u primary  3. HTN:  Well controlled.  Continue current medications and low sodium Dash type diet.   4. Tick Bite: no persistent rash Rx doxycycline f/u primary He complains of persistent fatigue/ malaise And memory issues. F/U with primary ? Lyme titers and other rickettsial diseases tested for consider f/u CT/MRI for cerebritis  Will  order echo to make sure EF normal given fatigue Normal cardiac exam and ECG   Echo F/U Cardiology PRN   Signed: Jenkins Rouge 05/10/2020, 10:42 AM

## 2020-05-10 ENCOUNTER — Other Ambulatory Visit: Payer: Self-pay

## 2020-05-10 ENCOUNTER — Ambulatory Visit (INDEPENDENT_AMBULATORY_CARE_PROVIDER_SITE_OTHER): Payer: Self-pay | Admitting: Cardiovascular Disease

## 2020-05-10 ENCOUNTER — Encounter: Payer: Self-pay | Admitting: *Deleted

## 2020-05-10 ENCOUNTER — Encounter: Payer: Self-pay | Admitting: Cardiovascular Disease

## 2020-05-10 VITALS — BP 122/92 | HR 84 | Ht 65.5 in | Wt 151.4 lb

## 2020-05-10 DIAGNOSIS — R0602 Shortness of breath: Secondary | ICD-10-CM

## 2020-05-10 NOTE — Patient Instructions (Signed)
Medication Instructions:  Your physician recommends that you continue on your current medications as directed. Please refer to the Current Medication list given to you today.  *If you need a refill on your cardiac medications before your next appointment, please call your pharmacy*   Lab Work: NONE   If you have labs (blood work) drawn today and your tests are completely normal, you will receive your results only by: MyChart Message (if you have MyChart) OR A paper copy in the mail If you have any lab test that is abnormal or we need to change your treatment, we will call you to review the results.   Testing/Procedures: Your physician has requested that you have an echocardiogram. Echocardiography is a painless test that uses sound waves to create images of your heart. It provides your doctor with information about the size and shape of your heart and how well your heart's chambers and valves are working. This procedure takes approximately one hour. There are no restrictions for this procedure.    Follow-Up: At [facility], you and your health needs are our priority.  As part of our continuing mission to provide you with exceptional heart care, we have created designated Provider Care Teams.  These Care Teams include your primary Cardiologist (physician) and Advanced Practice Providers (APPs -  Physician Assistants and Nurse Practitioners) who all work together to provide you with the care you need, when you need it.  We recommend signing up for the patient portal called "MyChart".  Sign up information is provided on this After Visit Summary.  MyChart is used to connect with patients for Virtual Visits (Telemedicine).  Patients are able to view lab/test results, encounter notes, upcoming appointments, etc.  Non-urgent messages can be sent to your provider as well.   To learn more about what you can do with MyChart, go to [email or web address].    Your next appointment:    As Needed    The format for your next appointment:   In Person  Provider:   Peter Nishan, MD   Other Instructions Thank you for choosing [facility]!    

## 2020-06-03 ENCOUNTER — Ambulatory Visit (HOSPITAL_COMMUNITY): Payer: Self-pay

## 2020-07-19 LAB — EXTERNAL GENERIC LAB PROCEDURE: COLOGUARD: NEGATIVE

## 2020-07-19 LAB — COLOGUARD: COLOGUARD: NEGATIVE

## 2020-08-16 ENCOUNTER — Emergency Department (HOSPITAL_COMMUNITY): Payer: 59

## 2020-08-16 ENCOUNTER — Other Ambulatory Visit: Payer: Self-pay

## 2020-08-16 ENCOUNTER — Encounter (HOSPITAL_COMMUNITY): Payer: Self-pay | Admitting: Emergency Medicine

## 2020-08-16 ENCOUNTER — Emergency Department (HOSPITAL_COMMUNITY)
Admission: EM | Admit: 2020-08-16 | Discharge: 2020-08-16 | Disposition: A | Discharge status: Home / Self Care | Payer: 59 | Attending: Emergency Medicine | Admitting: Emergency Medicine

## 2020-08-16 DIAGNOSIS — R569 Unspecified convulsions: Secondary | ICD-10-CM | POA: Diagnosis present

## 2020-08-16 DIAGNOSIS — I10 Essential (primary) hypertension: Secondary | ICD-10-CM | POA: Diagnosis not present

## 2020-08-16 DIAGNOSIS — Z7982 Long term (current) use of aspirin: Secondary | ICD-10-CM | POA: Insufficient documentation

## 2020-08-16 DIAGNOSIS — Z79899 Other long term (current) drug therapy: Secondary | ICD-10-CM | POA: Insufficient documentation

## 2020-08-16 DIAGNOSIS — F17210 Nicotine dependence, cigarettes, uncomplicated: Secondary | ICD-10-CM | POA: Diagnosis not present

## 2020-08-16 LAB — CBC WITH DIFFERENTIAL/PLATELET
Abs Immature Granulocytes: 0.07 10*3/uL (ref 0.00–0.07)
Basophils Absolute: 0 10*3/uL (ref 0.0–0.1)
Basophils Relative: 0 %
Eosinophils Absolute: 0.2 10*3/uL (ref 0.0–0.5)
Eosinophils Relative: 2 %
HCT: 37.2 % — ABNORMAL LOW (ref 39.0–52.0)
Hemoglobin: 12.8 g/dL — ABNORMAL LOW (ref 13.0–17.0)
Immature Granulocytes: 1 %
Lymphocytes Relative: 24 %
Lymphs Abs: 2.4 10*3/uL (ref 0.7–4.0)
MCH: 33.9 pg (ref 26.0–34.0)
MCHC: 34.4 g/dL (ref 30.0–36.0)
MCV: 98.4 fL (ref 80.0–100.0)
Monocytes Absolute: 0.8 10*3/uL (ref 0.1–1.0)
Monocytes Relative: 8 %
Neutro Abs: 6.3 10*3/uL (ref 1.7–7.7)
Neutrophils Relative %: 65 %
Platelets: 279 10*3/uL (ref 150–400)
RBC: 3.78 MIL/uL — ABNORMAL LOW (ref 4.22–5.81)
RDW: 12.5 % (ref 11.5–15.5)
WBC: 9.8 10*3/uL (ref 4.0–10.5)
nRBC: 0 % (ref 0.0–0.2)

## 2020-08-16 LAB — COMPREHENSIVE METABOLIC PANEL
ALT: 30 U/L (ref 0–44)
AST: 28 U/L (ref 15–41)
Albumin: 4.2 g/dL (ref 3.5–5.0)
Alkaline Phosphatase: 34 U/L — ABNORMAL LOW (ref 38–126)
Anion gap: 11 (ref 5–15)
BUN: 11 mg/dL (ref 6–20)
CO2: 22 mmol/L (ref 22–32)
Calcium: 9.3 mg/dL (ref 8.9–10.3)
Chloride: 105 mmol/L (ref 98–111)
Creatinine, Ser: 0.71 mg/dL (ref 0.61–1.24)
GFR calc Af Amer: >60 mL/min (ref >60)
GFR calc non Af Amer: >60 mL/min (ref >60)
Glucose, Bld: 124 mg/dL — ABNORMAL HIGH (ref 70–99)
Potassium: 3.5 mmol/L (ref 3.5–5.1)
Sodium: 138 mmol/L (ref 135–145)
Total Bilirubin: 0.7 mg/dL (ref 0.3–1.2)
Total Protein: 7.2 g/dL (ref 6.5–8.1)

## 2020-08-16 LAB — MAGNESIUM: Magnesium: 2 mg/dL (ref 1.7–2.4)

## 2020-08-16 LAB — ETHANOL: Alcohol, Ethyl (B): <10 mg/dL (ref <10)

## 2020-08-16 MED ORDER — LEVETIRACETAM IN NACL 1000 MG/100ML IV SOLN
1000.0000 mg | Freq: Once | INTRAVENOUS | Status: AC
  Administered 2020-08-16: 1000 mg via INTRAVENOUS

## 2020-08-16 MED ORDER — LEVETIRACETAM 500 MG PO TABS: 500.0000 mg | ORAL_TABLET | Freq: Two times a day (BID) | ORAL | 1 refills | Status: DC

## 2020-08-16 MED ORDER — ACETAMINOPHEN 500 MG PO TABS
1000.0000 mg | ORAL_TABLET | Freq: Once | ORAL | Status: AC
  Administered 2020-08-16: 1000 mg via ORAL
  Filled 2020-08-16: qty 2

## 2020-08-16 NOTE — ED Provider Notes (Signed)
[facility] EMERGENCY DEPARTMENT Provider Note   CSN: [id number] Arrival date & time: 08/16/20  1445     History Chief Complaint  Patient presents with  . Seizures    Ricky Werner is a 54 y.o. male.  He has a history of hypertension and seizure disorder although it sounds like his last seizure was related to heavy alcohol use.  He says he does not drink that much anymore.  History is given mostly by the son but patient is able to provide some history.  It sounds like he had a seizure around 10 AM today that lasted about 5 minutes.  He had a second seizure around 230 this afternoon where he passed out in the driveway.  Son said he was stiff and like a board in his hands were shaking.  Patient does not recall the episodes.  He is complaining of a moderate throbbing headache.  He denies any preceding symptoms.  No recent illness.  Not Covid vaccinated.  The history is provided by the patient.  Seizures Seizure activity on arrival: no   Seizure type:  Unable to specify Initial focality:  Unable to specify Episode characteristics: confusion and stiffening   Postictal symptoms: confusion   Return to baseline: no   Severity:  Unable to specify Duration:  5 minutes Progression:  Resolved Context: alcohol withdrawal (hx of) and drug use (marijuana)   Context: not change in medication   Recent head injury:  During the event PTA treatment:  None History of seizures: yes        Past Medical History:  Diagnosis Date  . Alcohol abuse   . Anxiety   . Carpal tunnel syndrome on right   . Chronic back pain   . Depression   . Headache   . History of dental problems   . Hypertension   . Panic attacks   . Right tennis elbow   . Sciatica   . Seizures ([place])   . Vertigo     Patient Active Problem List   Diagnosis Date Noted  . Alcohol withdrawal seizure ([place]) 09/20/2017  . Alcohol abuse 09/20/2017  . Chronic back pain 09/20/2017  . Sciatica 09/20/2017  . Hypertension 09/20/2017    . Elevated liver enzymes 09/20/2017  . Fatty liver 09/20/2017  . Severe protein-calorie malnutrition ([place]) 09/20/2017  . Elevated bilirubin 09/20/2017  . Tobacco abuse 09/20/2017  . Benzodiazepine dependence ([place]) 09/20/2017    History reviewed. No pertinent surgical history.     Family History  Problem Relation Age of Onset  . Hypertension Mother   . Hyperlipidemia Mother   . Diabetes Mother   . Hypertension Brother     Social History   Tobacco Use  . Smoking status: Current Every Day Smoker    Packs/day: 1.00    Years: 39.00    Pack years: 39.00    Types: Cigarettes  . Smokeless tobacco: Never Used  Vaping Use  . Vaping Use: Never used  Substance Use Topics  . Alcohol use: Yes    Alcohol/week: 6.0 standard drinks    Types: 6 Cans of beer per week    Comment: previous heavy drinker sober since 2018. pt drinks a beer "here and there"   . Drug use: Not Currently    Types: Marijuana    Comment: occ    Home Medications Prior to Admission medications   Medication Sig Start Date End Date Taking? Authorizing Provider  ALPRAZolam Duanne Moron) 0.5 MG tablet Take 0.5-1 mg by mouth  4 (four) times daily as needed. 04/18/20   [provider]  amLODipine (NORVASC) 10 MG tablet Take 10 mg by mouth daily. 12/01/14   [provider]  Ascorbic Acid (VITAMIN C) 1000 MG tablet Take 1,000 mg by mouth daily.    [provider]  aspirin EC 81 MG tablet Take 81 mg by mouth daily.    [provider]  Hyprom-Naphaz-Polysorb-Zn Sulf (CLEAR EYES COMPLETE OP) Place 1 drop into both eyes daily as needed.     [provider]  ibuprofen (ADVIL,MOTRIN) 600 MG tablet Take 600 mg by mouth every 6 (six) hours as needed.    [provider]  lisinopril (PRINIVIL,ZESTRIL) 20 MG tablet Take 20 mg by mouth 2 (two) times daily. 12/01/14   [provider]  Multiple Vitamin (MULTIVITAMIN WITH MINERALS) TABS tablet Take 1 tablet by mouth daily. 09/22/17    Johnson, Clanford L, MD  traMADol (ULTRAM) 50 MG tablet Take by mouth every 6 (six) hours as needed.    [provider]    Allergies    Patient has no known allergies.  Review of Systems   Review of Systems  Constitutional: Negative for fever.  HENT: Negative for sore throat.   Eyes: Negative for visual disturbance.  Respiratory: Negative for shortness of breath.   Cardiovascular: Negative for chest pain.  Gastrointestinal: Negative for abdominal pain.  Genitourinary: Negative for dysuria.  Musculoskeletal: Negative for neck pain.  Skin: Negative for rash.  Neurological: Positive for seizures and headaches.    Physical Exam Updated Vital Signs BP (!) 137/92 (BP Location: Left Arm)   Pulse 97   Temp 99 F (37.2 C) (Oral)   Resp 16   Ht '5\' 8"'  (1.727 m)   Wt 68 kg   SpO2 97%   BMI 22.81 kg/m   Physical Exam Vitals and nursing note reviewed.  Constitutional:      Appearance: Normal appearance. He is well-developed.  HENT:     Head: Normocephalic and atraumatic.  Eyes:     Conjunctiva/sclera: Conjunctivae normal.  Cardiovascular:     Rate and Rhythm: Normal rate and regular rhythm.     Heart sounds: No murmur heard.   Pulmonary:     Effort: Pulmonary effort is normal. No respiratory distress.     Breath sounds: Normal breath sounds.  Abdominal:     Palpations: Abdomen is soft.     Tenderness: There is no abdominal tenderness.  Musculoskeletal:        General: No deformity or signs of injury. Normal range of motion.     Cervical back: Neck supple.  Skin:    General: Skin is warm and dry.     Capillary Refill: Capillary refill takes less than 2 seconds.  Neurological:     General: No focal deficit present.     Mental Status: He is alert and oriented to person, place, and time.     Sensory: No sensory deficit.     Motor: No weakness.     Comments: Alert and oriented.  Somewhat slow to answer some questions.     ED Results / Procedures / Treatments     Labs (all labs ordered are listed, but only abnormal results are displayed) Labs Reviewed  CBC WITH DIFFERENTIAL/PLATELET - Abnormal; Notable for the following components:      Result Value   RBC 3.78 (*)    Hemoglobin 12.8 (*)    HCT 37.2 (*)    All other components within normal  limits  COMPREHENSIVE METABOLIC PANEL - Abnormal; Notable for the following components:   Glucose, Bld 124 (*)    Alkaline Phosphatase 34 (*)    All other components within normal limits  CBG MONITORING, ED - Abnormal; Notable for the following components:   Glucose-Capillary 135 (*)    All other components within normal limits  MAGNESIUM  ETHANOL    EKG EKG Interpretation  Date/Time:  Tuesday August 16 2020 14:58:28 EDT Ventricular Rate:  102 PR Interval:    QRS Duration: 97 QT Interval:  354 QTC Calculation: 462 R Axis:   63 Text Interpretation: Sinus tachycardia Probable left atrial enlargement Baseline wander in lead(s) V5 V6 No significant change since prior 11/19 Confirmed by Aletta Edouard [phone]) on 08/16/2020 3:07:36 PM   Radiology DG Ribs Unilateral W/Chest Right  Result Date: 08/16/2020 CLINICAL DATA:  Anterior chest pain and lower back pain. EXAM: RIGHT RIBS AND CHEST - 3+ VIEW COMPARISON:  October 19, 2018 FINDINGS: No acute fracture or other bone lesions are seen involving the ribs. There is no evidence of pneumothorax or pleural effusion. Both lungs are clear. Heart size and mediastinal contours are within normal limits. IMPRESSION: Negative. Electronically Signed   By: Virgina Norfolk M.D.   On: 08/16/2020 18:29   DG Lumbar Spine Complete  Result Date: 08/16/2020 CLINICAL DATA:  Anterior chest and lower back pain. EXAM: LUMBAR SPINE - COMPLETE 4+ VIEW COMPARISON:  September 20, 2017 FINDINGS: There is no evidence of lumbar spine fracture. Alignment is normal. Mild to moderate severity anterior endplate sclerosis is seen at the level of L3-L4, with mild endplate sclerosis seen at  the level of L5-S1. Intervertebral disc spaces are maintained. IMPRESSION: Degenerative disc disease at L3-L4 and L5-S1. Electronically Signed   By: Virgina Norfolk M.D.   On: 08/16/2020 18:32   CT HEAD WO CONTRAST  Result Date: 08/16/2020 CLINICAL DATA:  Seizures today. EXAM: CT HEAD WITHOUT CONTRAST TECHNIQUE: Contiguous axial images were obtained from the base of the skull through the vertex without intravenous contrast. COMPARISON:  09/20/2017 head CT FINDINGS: Brain: The brainstem, cerebellum, cerebral peduncles, thalami, basal ganglia, basilar cisterns, and ventricular system appear within normal limits. No intracranial hemorrhage, mass lesion, or acute CVA. Vascular: Unremarkable Skull: Unremarkable Sinuses/Orbits: Unremarkable Other: No supplemental non-categorized findings. IMPRESSION: No significant abnormality identified. Electronically Signed   By: Van Clines M.D.   On: 08/16/2020 17:05    Procedures Procedures (including critical care time)  Medications Ordered in ED Medications  levETIRAcetam (KEPPRA) IVPB 1000 mg/100 mL premix (0 mg Intravenous Stopped 08/16/20 1555)  acetaminophen (TYLENOL) tablet 1,000 mg (1,000 mg Oral Given 08/16/20 1609)    ED Course  I have reviewed the triage vital signs and the nursing notes.  Pertinent labs & imaging results that were available during my care of the patient were reviewed by me and considered in my medical decision making (see chart for details).  Clinical Course as of Aug 18 1427  Tue Aug 16, 2020  1803 Reassessed patient he is now complaining of right-sided rib pain and low back pain.  Sounds like low back pain is chronic and the rib pain has been intermittent for the last couple weeks.   [MB]  8185 Chest x-ray lumbar spine x-rays interpreted by me as no acute infiltrates no pneumothorax no obvious fracture noted.  Awaiting radiology reading.   [MB]  1913 Patient mental status cleared.  He is ambulated in the department  without any difficulty.  Will discharge on  Keppra and have follow-up with neurology.  Counseled to not drive or use heavy machinery until cleared by neurology.   [MB]    Clinical Course User Index [MB] Hayden Rasmussen, MD   MDM Rules/Calculators/A&P                          This patient complains of possible seizure x2; this involves an extensive number of treatment Options and is a complaint that carries with it a high risk of complications and Morbidity. The differential includes seizure, stroke, bleed, metabolic derangement, alcohol withdrawal, substance abuse  I ordered, reviewed and interpreted labs, which included CBC with normal white count, hemoglobin slightly down from baseline, chemistries normal other than mildly elevated glucose and alk phos, alcohol negative, magnesium normal I ordered medication IV Keppra load and Tylenol for headache I ordered imaging studies which included CT head and chest x-ray right ribs, lumbar spine and I independently    visualized and interpreted imaging which showed no acute bleed.  Chest x-ray showing no pneumothorax no obvious rib fractures and lumbar spine showing degenerative changes Additional history obtained from patient's son Previous records obtained and reviewed in epic, patient did have prior ED visit for seizure which was thought to be alcohol withdrawal related  After the interventions stated above, I reevaluated the patient and found patient to be awake and alert.  Mental status cleared.  We will put him on Keppra and recommended neurology follow-up.  Counseled not to drive or operate heavy machinery until evaluated by neurology.  Patient understands plan and return instructions discussed   Final Clinical Impression(s) / ED Diagnoses Final diagnoses:  Seizure [facility])    Rx / DC Orders ED Discharge Orders         Ordered    levETIRAcetam (KEPPRA) 500 MG tablet  2 times daily        08/16/20 1914           Hayden Rasmussen,  MD 08/17/20 1432

## 2020-08-16 NOTE — ED Triage Notes (Addendum)
History of seizure last few years per son. Pt has 2 seizures today.  First seizure was about 10 am,  Pt was on his knees with hand shaking, resolved about after 5 minutes.  Second seizure at 2:30 pm, pt passed out in the driveway (gravel)and hit head.  Pt is confused.

## 2020-08-16 NOTE — Discharge Instructions (Addendum)
You are seen in the emergency department for evaluation of 2 possible seizures.  Needed blood work and a CAT scan of your head that did not show any serious abnormalities.  We are starting you on seizure medication.  You should contact neurology for close follow-up.  You should not drive or operate heavy machinery until you are cleared by neurology.  Return to the emergency department if any worsening or concerning symptoms.

## 2020-08-16 NOTE — ED Notes (Signed)
Ambulated pt. In the hallway 200 ft. Pt. Had a steady gait and denied dizziness or weakness.

## 2020-08-16 NOTE — ED Notes (Signed)
Patient transported to CT 

## 2020-08-17 LAB — CBG MONITORING, ED: Glucose-Capillary: 135 mg/dL — ABNORMAL HIGH (ref 70–99)

## 2020-09-30 DIAGNOSIS — R569 Unspecified convulsions: Secondary | ICD-10-CM

## 2020-09-30 HISTORY — DX: Unspecified convulsions: R56.9

## 2020-10-13 IMAGING — CT CT ABD-PELV W/ CM
2 of 5 series · 16 of 46 positions shown, 18 images · IV contrast (Isovue)
Comparison: 06/03/2014 CT

CLINICAL DATA: 52-year-old male with acute RIGHT abdominal and
pelvic pain for 3 days.

EXAM:
CT ABDOMEN AND PELVIS WITH CONTRAST
TECHNIQUE: Multidetector CT imaging of the abdomen and pelvis was performed
using the standard protocol following bolus administration of
intravenous contrast.
CONTRAST:  100mL TAU4DO-N99 IOPAMIDOL ([id number]) INJECTION 61%

[Series 2: axial st · axial · 0.67mm/px · z∈[-824,-428]mm · 13 of 89 slices shown, 15 images]
[im 5/89  soft-tissue]
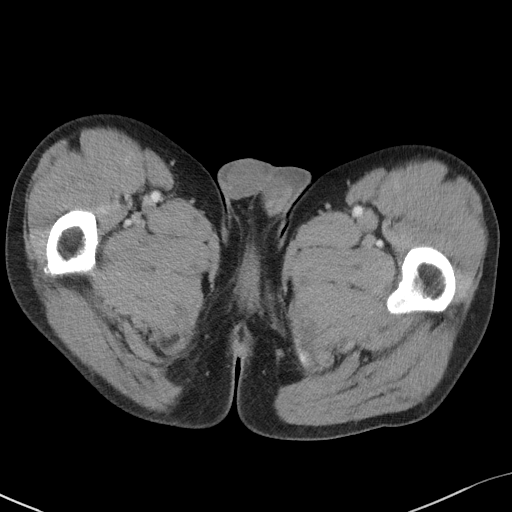
[im 5/89  bone]
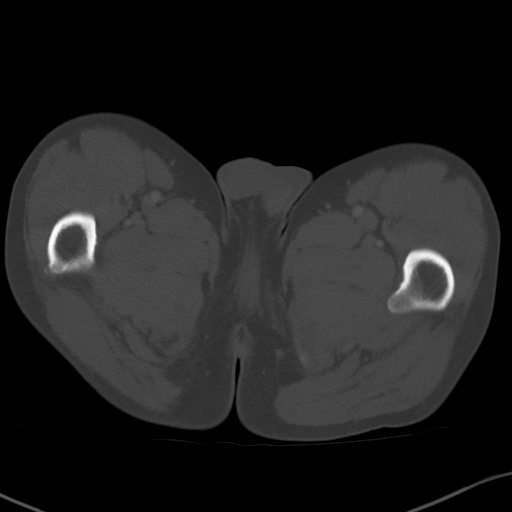
[im 10/89  soft-tissue]
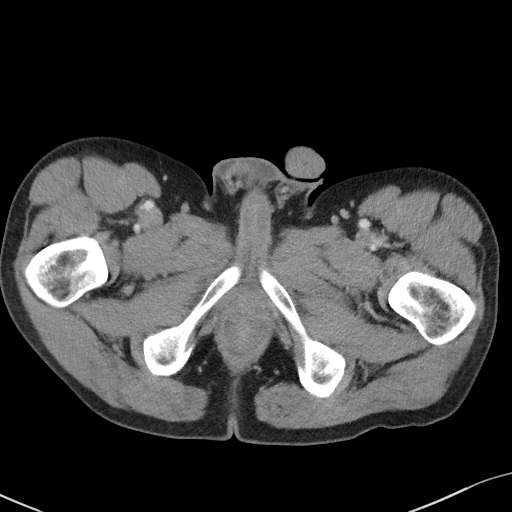
[im 20/89  soft-tissue]
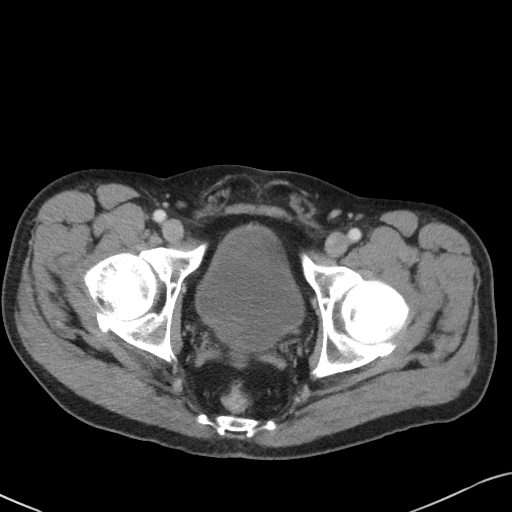
[im 25/89  soft-tissue]
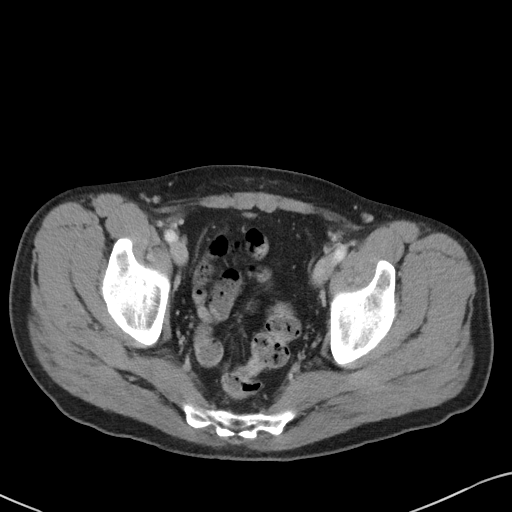
[im 30/89  soft-tissue]
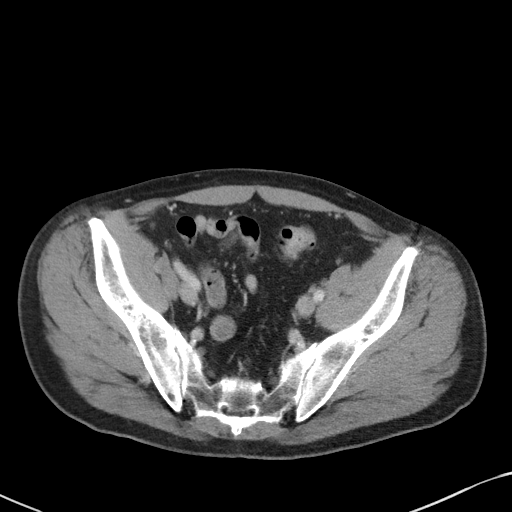
[im 40/89  soft-tissue]
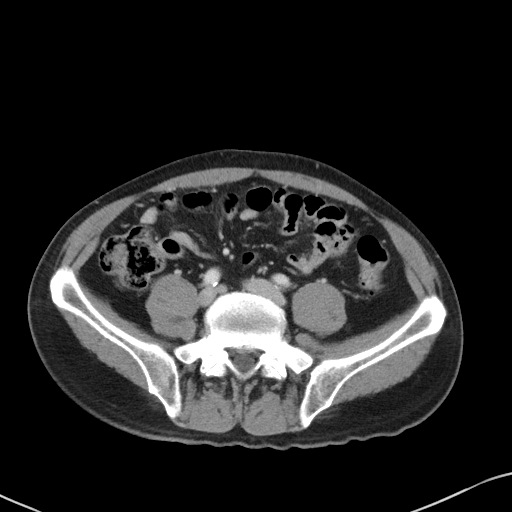
[im 45/89  soft-tissue]
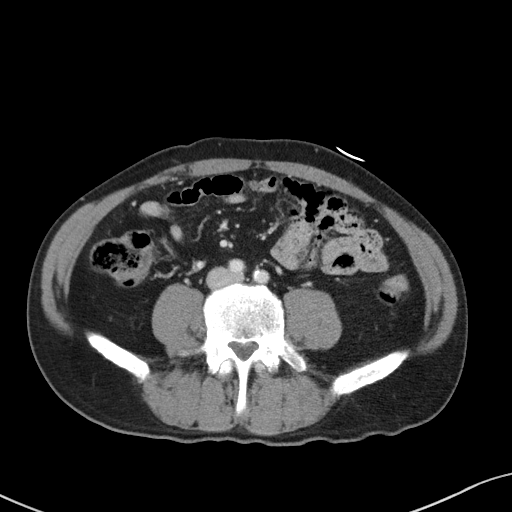
[im 49/89  soft-tissue]
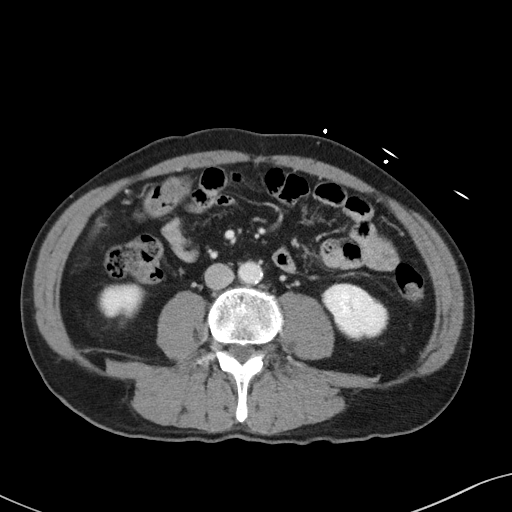
[im 59/89  soft-tissue]
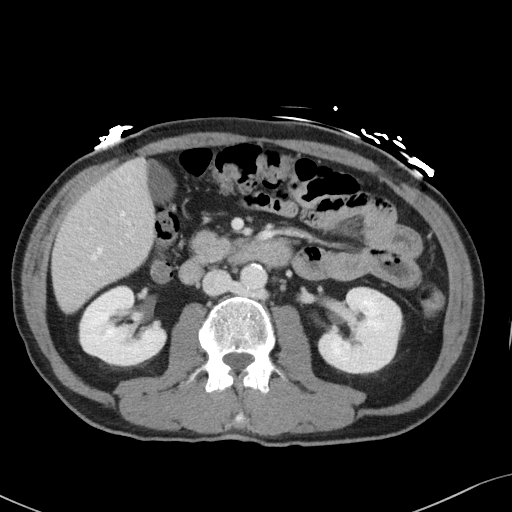
[im 59/89  bone]
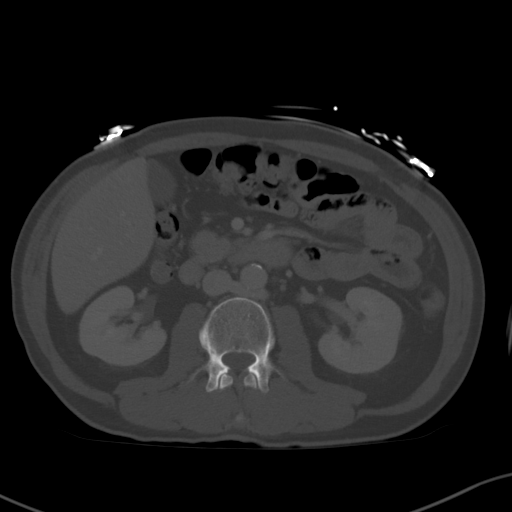
[im 64/89  soft-tissue]
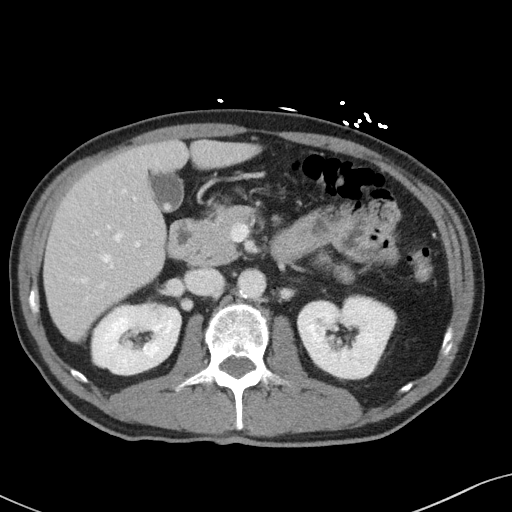
[im 69/89  soft-tissue]
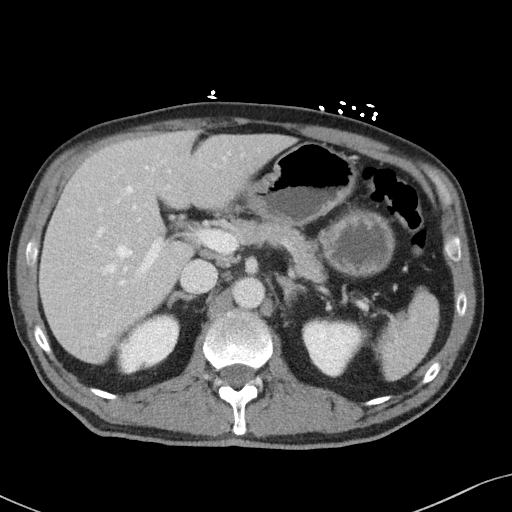
[im 79/89  soft-tissue]
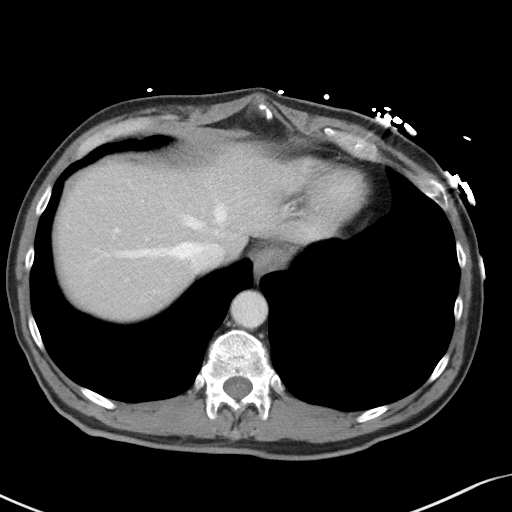
[im 84/89  soft-tissue]
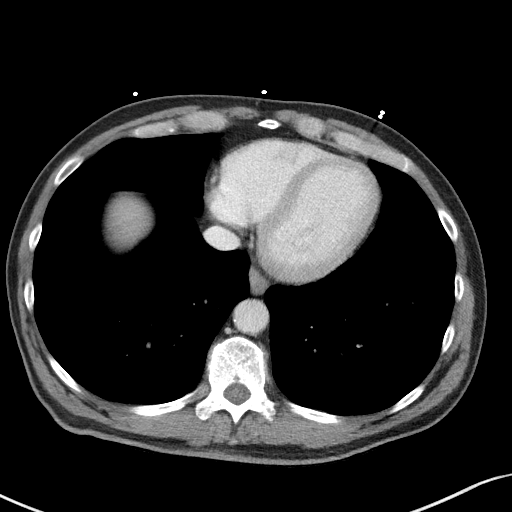

[Series 6: coronal st · coronal · 0.73mm/px · 3 of 91 slices shown]
[im 31/91  soft-tissue]
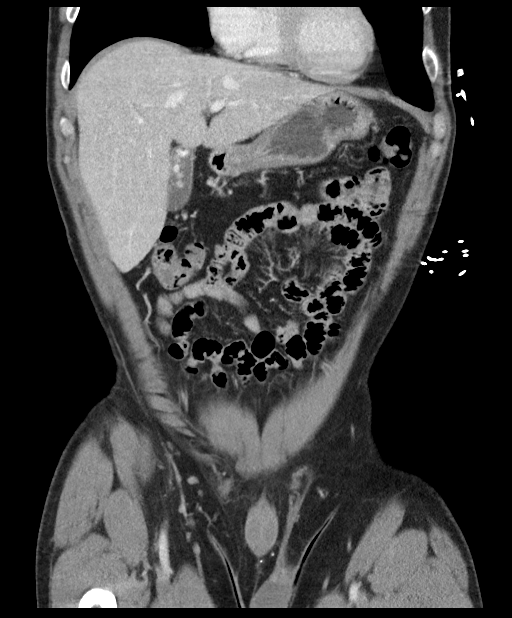
[im 41/91  soft-tissue]
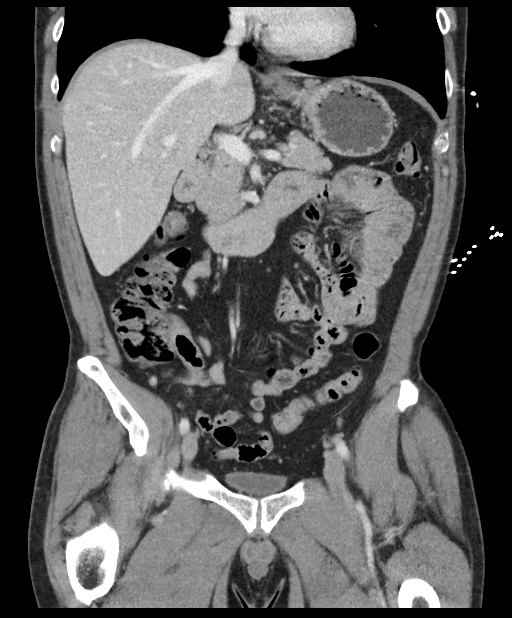
[im 51/91  soft-tissue]
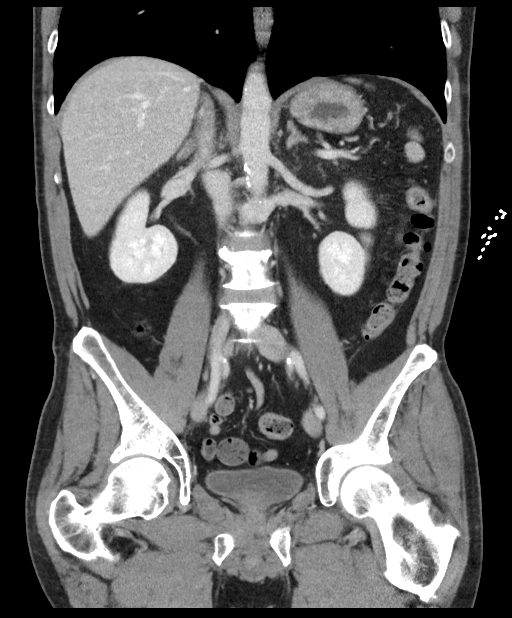

[16 of 46 positions shown; findings below may reference images not displayed]

FINDINGS: Lower chest: Unremarkable

Hepatobiliary: Multiple gallstones are identified. No CT evidence of
acute cholecystitis noted. The liver is unremarkable. No biliary
dilatation.

Pancreas: Unremarkable

Spleen: Unremarkable

Adrenals/Urinary Tract: The kidneys, adrenal glands and bladder are
unremarkable.

Stomach/Bowel: Stomach is within normal limits. Appendix appears
normal. No evidence of bowel wall thickening, distention, or
inflammatory changes.

Vascular/Lymphatic: Aortic atherosclerosis. No enlarged abdominal or
pelvic lymph nodes.

Reproductive: Prostate is unremarkable.

Other: No ascites, abscess or pneumoperitoneum. No abdominal wall
hernia.

Musculoskeletal: No acute or suspicious bony abnormalities.
IMPRESSION: 1. No evidence of acute abnormality.
2. Cholelithiasis without CT evidence of acute cholecystitis. If
there is strong clinical suspicion for acute cholecystitis, consider
ultrasound or nuclear medicine study.
3.  Aortic Atherosclerosis ([id number]).

## 2020-11-15 ENCOUNTER — Other Ambulatory Visit: Payer: Self-pay | Admitting: Neurology

## 2020-11-15 ENCOUNTER — Other Ambulatory Visit (HOSPITAL_COMMUNITY): Payer: Self-pay | Admitting: Neurology

## 2020-11-15 DIAGNOSIS — R569 Unspecified convulsions: Secondary | ICD-10-CM

## 2020-11-30 ENCOUNTER — Encounter (HOSPITAL_COMMUNITY): Payer: Self-pay

## 2020-11-30 ENCOUNTER — Ambulatory Visit (HOSPITAL_COMMUNITY): Payer: 59

## 2021-02-22 ENCOUNTER — Encounter: Payer: Self-pay | Admitting: *Deleted

## 2021-02-22 ENCOUNTER — Other Ambulatory Visit: Payer: Self-pay | Admitting: *Deleted

## 2021-02-24 ENCOUNTER — Encounter: Payer: Self-pay | Admitting: Diagnostic Neuroimaging

## 2021-02-24 ENCOUNTER — Ambulatory Visit: Payer: 59 | Admitting: Diagnostic Neuroimaging

## 2021-02-24 ENCOUNTER — Other Ambulatory Visit: Payer: Self-pay

## 2021-02-24 VITALS — BP 162/89 | HR 72 | Ht 67.0 in | Wt 164.0 lb

## 2021-02-24 DIAGNOSIS — G40909 Epilepsy, unspecified, not intractable, without status epilepticus: Secondary | ICD-10-CM | POA: Diagnosis not present

## 2021-02-24 MED ORDER — LEVETIRACETAM 1000 MG PO TABS: 1000.0000 mg | ORAL_TABLET | Freq: Two times a day (BID) | ORAL | 4 refills | Status: DC
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
## 2021-05-19 ENCOUNTER — Other Ambulatory Visit: Payer: Self-pay

## 2021-05-19 ENCOUNTER — Encounter (HOSPITAL_COMMUNITY): Payer: Self-pay | Admitting: Emergency Medicine

## 2021-05-19 ENCOUNTER — Emergency Department (HOSPITAL_COMMUNITY)
Admission: EM | Admit: 2021-05-19 | Discharge: 2021-05-19 | Disposition: A | Discharge status: Home / Self Care | Payer: 59 | Attending: Emergency Medicine | Admitting: Emergency Medicine

## 2021-05-19 DIAGNOSIS — Z7982 Long term (current) use of aspirin: Secondary | ICD-10-CM | POA: Insufficient documentation

## 2021-05-19 DIAGNOSIS — R569 Unspecified convulsions: Secondary | ICD-10-CM | POA: Insufficient documentation

## 2021-05-19 DIAGNOSIS — G4489 Other headache syndrome: Secondary | ICD-10-CM

## 2021-05-19 DIAGNOSIS — Z79899 Other long term (current) drug therapy: Secondary | ICD-10-CM | POA: Diagnosis not present

## 2021-05-19 DIAGNOSIS — I10 Essential (primary) hypertension: Secondary | ICD-10-CM | POA: Diagnosis not present

## 2021-05-19 DIAGNOSIS — F17210 Nicotine dependence, cigarettes, uncomplicated: Secondary | ICD-10-CM | POA: Diagnosis not present

## 2021-05-19 MED ORDER — SODIUM CHLORIDE 0.9 % IV BOLUS
1000.0000 mL | Freq: Once | INTRAVENOUS | Status: AC
  Administered 2021-05-19: 1000 mL via INTRAVENOUS

## 2021-05-19 MED ORDER — LEVETIRACETAM IN NACL 1000 MG/100ML IV SOLN
1000.0000 mg | Freq: Once | INTRAVENOUS | Status: AC
  Administered 2021-05-19: 1000 mg via INTRAVENOUS
  Filled 2021-05-19: qty 100

## 2021-05-19 MED ORDER — PROCHLORPERAZINE EDISYLATE 10 MG/2ML IJ SOLN
10.0000 mg | Freq: Once | INTRAMUSCULAR | Status: AC
  Administered 2021-05-19: 10 mg via INTRAVENOUS
  Filled 2021-05-19: qty 2

## 2021-05-19 NOTE — ED Notes (Signed)
Upon entry to the room, the pts c collar was removed, pt c/o bil shoulder and neck pain

## 2021-05-19 NOTE — ED Provider Notes (Signed)
[facility] EMERGENCY DEPARTMENT Provider Note   CSN: [id number] Arrival date & time: 05/19/21  0534     History Chief Complaint  Patient presents with   Seizures    Ricky Werner is a 55 y.o. male.  The history is provided by the patient.  Seizures He has history of alcohol abuse, hypertension, seizures and came in by ambulance after having had a seizure at home.  Seizure was not witnessed but his roommate did find him on the floor.  There was no incontinence but he did bite his tongue.  Patient states he has been compliant with his anticonvulsant regimen but does admit to having consumed some alcohol last night.  He also admits to marijuana and methamphetamine use.  At this point, he is complaining of a generalized headache.   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    Chronic back pain    Depression    Headache    History of dental problems    Hypertension    Osteoarthritis    Panic attacks    Right tennis elbow    Sciatica    Seizures (HCC) 09/30/2020   Vertigo     Patient Active Problem List   Diagnosis Date Noted   Alcohol withdrawal seizure (HCC) 09/20/2017   Alcohol abuse 09/20/2017   Chronic back pain 09/20/2017   Sciatica 09/20/2017   Hypertension 09/20/2017   Elevated liver enzymes 09/20/2017   Fatty liver 09/20/2017   Severe protein-calorie malnutrition (HCC) 09/20/2017   Elevated bilirubin 09/20/2017   Tobacco abuse 09/20/2017   Benzodiazepine dependence (HCC) 09/20/2017    Past Surgical History:  Procedure Laterality Date   KIDNEY STONE SURGERY         Family History  Problem Relation Age of Onset   Hypertension Mother    Hyperlipidemia Mother    Diabetes Mother    Breast cancer Mother    Hypertension Brother    Stroke Father    Hypertension Father     Social History   Tobacco Use   Smoking status: Every Day    Packs/day: 1.00     Years: 39.00    Pack years: 39.00    Types: Cigarettes   Smokeless tobacco: Never  Vaping Use   Vaping Use: Never used  Substance Use Topics   Alcohol use: Yes    Alcohol/week: 6.0 standard drinks    Types: 6 Cans of beer per week    Comment: pt describes depends on what week may have 5th of liqour within a week and go a week without   Drug use: Yes    Types: Marijuana    Comment: occ    Home Medications Prior to Admission medications   Medication Sig Start Date End Date Taking? Authorizing Provider  ALPRAZolam Prudy Feeler) 1 MG tablet As needed qid    [provider]  amLODipine (NORVASC) 10 MG tablet Take 10 mg by mouth daily. 12/01/14   [provider]  Ascorbic Acid (VITAMIN C) 1000 MG tablet Take 1,000 mg by mouth daily.    [provider]  aspirin EC 81 MG tablet Take 81 mg by mouth daily.    [provider]  Hyprom-Naphaz-Polysorb-Zn Sulf (CLEAR EYES COMPLETE OP) Place 1 drop into both eyes daily as needed.     [provider]  levETIRAcetam (KEPPRA) 1000 MG tablet Take 1 tablet (  1,000 mg total) by mouth 2 (two) times daily. 02/24/21   Penumalli, Glenford Bayley, MD  lisinopril (PRINIVIL,ZESTRIL) 20 MG tablet Take 20 mg by mouth 2 (two) times daily. 12/01/14   [provider]  meloxicam (MOBIC) 7.5 MG tablet Take 7.5 mg by mouth 2 (two) times daily as needed. 01/30/21   [provider]  Multiple Vitamin (MULTIVITAMIN WITH MINERALS) TABS tablet Take 1 tablet by mouth daily. 09/22/17   Johnson, Clanford L, MD  traMADol (ULTRAM) 50 MG tablet Take 50 mg by mouth 4 (four) times daily as needed.    [provider]    Allergies    Patient has no known allergies.  Review of Systems   Review of Systems  Neurological:  Positive for seizures.  All other systems reviewed and are negative.  Physical Exam Updated Vital Signs BP (!) 165/118 Comment: Dr Preston Fleeting made aware  Pulse (!) 105   Resp 20   Ht 5\' 7"  (1.702 m)   Wt 74.4 kg    SpO2 96%   BMI 25.69 kg/m   Physical Exam Vitals and nursing note reviewed.  55 year old male, resting comfortably and in no acute distress. Vital signs are significant for elevated blood pressure and mildly elevated heart rate. Oxygen saturation is 96%, which is normal. Head is normocephalic and atraumatic. PERRLA, EOMI. Oropharynx is clear.  2 bite marks are seen on the ventral side of the tongue without active bleeding.  Lacerations are not large enough to require suturing. Neck is nontender and supple without adenopathy or JVD. Back is nontender and there is no CVA tenderness. Lungs are clear without rales, wheezes, or rhonchi. Chest is nontender. Heart has regular rate and rhythm without murmur. Abdomen is soft, flat, nontender without masses or hepatosplenomegaly and peristalsis is normoactive. Extremities have no cyanosis or edema, full range of motion is present. Skin is warm and dry without rash. Neurologic: Mental status is normal, cranial nerves are intact, there are no motor or sensory deficits.  ED Results / Procedures / Treatments    EKG EKG Interpretation  Date/Time:  Friday May 19 2021 05:39:50 EDT Ventricular Rate:  103 PR Interval:  154 QRS Duration: 96 QT Interval:  373 QTC Calculation: 489 R Axis:   58 Text Interpretation: Sinus tachycardia Probable left atrial enlargement Borderline prolonged QT interval Baseline wander in lead(s) III aVF When compared with ECG of 08/16/2020, No significant change was found Confirmed by 08/18/2020 (Dione Booze) on 05/19/2021 5:43:49 AM  Procedures Procedures   Medications Ordered in ED Medications  levETIRAcetam (KEPPRA) IVPB 1000 mg/100 mL premix (has no administration in time range)  sodium chloride 0.9 % bolus 1,000 mL (has no administration in time range)  prochlorperazine (COMPAZINE) injection 10 mg (has no administration in time range)    ED Course  I have reviewed the triage vital signs and the nursing  notes.  Pertinent labs & imaging results that were available during my care of the patient were reviewed by me and considered in my medical decision making (see chart for details).   MDM Rules/Calculators/A&P                         Seizure with postictal headache.  Elevated blood pressure in patient with history of hypertension.  Seizure may have been precipitated by alcohol or methamphetamine use.  Old records reviewed confirming prior ED visits for seizures.  We will give loading dose of levetiracetam and observe in the  ED.  Of note, patient had been referred to [facility] neurology but was not happy and does not want to go back there, states he cannot see the neurologist in [place] because of insurance.  He will need to be referred to a neurologist that he can work with.  He is encouraged to abstain from alcohol and methamphetamine.  Case is signed out to Dr. Bernette Mayers.  Final Clinical Impression(s) / ED Diagnoses Final diagnoses:  Seizure (HCC)  Elevated blood pressure reading with diagnosis of hypertension  Postictal headache    Rx / DC Orders ED Discharge Orders     None        Dione Booze, MD 05/19/21 [phone]

## 2021-05-19 NOTE — ED Notes (Signed)
EDP messaged re: pts BP, per MD pt to take meds once he is home, plan is to discharge the pt

## 2021-05-19 NOTE — ED Notes (Signed)
Dr. Glick at bedside.  

## 2021-05-19 NOTE — ED Provider Notes (Signed)
Care of the patient assumed at the change of shift. History of seizure and had a seizure this morning. Physical Exam  BP (!) 171/102   Pulse 78   Resp 15   Ht 5\' 7"  (1.702 m)   Wt 74.4 kg   SpO2 94%   BMI 25.69 kg/m   Physical Exam Awake and alert ED Course/Procedures     Procedures  MDM  Patient awake and alert. No longer post-ictal. Given Keppra loading dose and IVF. Ready for discharge with PCP and Neurology follow up.        , MD 05/19/21 [phone]
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
## 2021-05-19 NOTE — ED Notes (Signed)
Pt verbalizes that he will take his HTN meds as soon as he gets home

## 2021-11-20 NOTE — Progress Notes (Signed)
CARDIOLOGY CONSULT NOTE       Patient ID: KHOLE BRANCH MRN: [id number] DOB/AGE: 57/06/1966 58 y.o.  Admit date: (Not on file) Referring Physician: [facility]  Primary Physician: Elfredia Nevins, MD Primary Cardiologist: New Reason for Consultation: Fatigue   Active Problems:   * No active hospital problems. *   HPI:  55 y.o. with istory of anxiety, ETOH abuse, HTN depression panic attacks and seizures. Referred by [facility] June 2021 for ? chest pain. No previously documented CAD Seen by primary 03/30/20 complained of chest pain Onset for 1-2 weeks Moderate pressure sensation in substernal area No associated dyspnea palpitations or syncope On xanax and buspar for anxiety.  On norvasc and zestril for BP Smokes 1 ppd Had a tick bite and was covered with doxycycline On my interview he never mentioned any chest pain   His primary complaint is malaise/fatigue and memory issues since having tick bites. He indicates being delerious He works Product manager things Has two sons one is with him on weekends. He still smokes and uses CBD oil regularly Use to smoke a lot of marijuana. Has been sober since 2018 but has history of DTls   Pain atypical no stress testing ordered Echo ordered but never done June 2021   Had seizure in June recurrent loaded with Keppra had been drinking and using marijuana and methamphetamine He seems to have a chronic drug habit including previous LSD and cocaine  Did not get echo previously due to insurance and does not want one now until he checks with his current insurance    ROS All other systems reviewed and negative except as noted above  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    Chronic back pain    Depression    Headache    History of dental problems    Hypertension    Osteoarthritis    Panic attacks    Right tennis elbow    Sciatica    Seizures  (HCC) 09/30/2020   Vertigo     Family History  Problem Relation Age of Onset   Hypertension Mother    Hyperlipidemia Mother    Diabetes Mother    Breast cancer Mother    Hypertension Brother    Stroke Father    Hypertension Father     Social History   Socioeconomic History   Marital status: Divorced    Spouse name: Not on file   Number of children: Not on file   Years of education: Not on file   Highest education level: Not on file  Occupational History   Not on file  Tobacco Use   Smoking status: Every Day    Packs/day: 1.00    Years: 39.00    Pack years: 39.00    Types: Cigarettes   Smokeless tobacco: Never  Vaping Use   Vaping Use: Never used  Substance and Sexual Activity   Alcohol use: Yes    Alcohol/week: 6.0 standard drinks    Types: 6 Cans of beer per week    Comment: pt describes depends on what week may have 5th of liqour within a week and go a week without   Drug use: Yes    Types: Marijuana    Comment: occ   Sexual activity: Not on file  Other Topics Concern   Not on file  Social History Narrative  Lives alone   Social Determinants of Health   Financial Resource Strain: Not on file  Food Insecurity: Not on file  Transportation Needs: Not on file  Physical Activity: Not on file  Stress: Not on file  Social Connections: Not on file  Intimate Partner Violence: Not on file    Past Surgical History:  Procedure Laterality Date   KIDNEY STONE SURGERY        Current Outpatient Medications:    alprazolam (XANAX) 2 MG tablet, Take 2 mg by mouth 3 (three) times daily., Disp: , Rfl:    amLODipine (NORVASC) 10 MG tablet, Take 10 mg by mouth daily., Disp: , Rfl: 6   aspirin EC 81 MG tablet, Take 81 mg by mouth daily., Disp: , Rfl:    cloNIDine (CATAPRES) 0.2 MG tablet, Take 0.2 mg by mouth 3 (three) times daily., Disp: , Rfl:    escitalopram (LEXAPRO) 10 MG tablet, Take 1 tablet by mouth daily., Disp: , Rfl:    Hyprom-Naphaz-Polysorb-Zn Sulf (CLEAR  EYES COMPLETE OP), Place 1 drop into both eyes daily as needed. , Disp: , Rfl:    levETIRAcetam (KEPPRA) 1000 MG tablet, Take 1 tablet (1,000 mg total) by mouth 2 (two) times daily., Disp: 180 tablet, Rfl: 4   lisinopril (PRINIVIL,ZESTRIL) 20 MG tablet, Take 20 mg by mouth 2 (two) times daily., Disp: , Rfl: 6   meloxicam (MOBIC) 7.5 MG tablet, Take 7.5 mg by mouth 2 (two) times daily as needed., Disp: , Rfl:    Multiple Vitamin (MULTIVITAMIN WITH MINERALS) TABS tablet, Take 1 tablet by mouth daily., Disp: , Rfl:    traMADol (ULTRAM) 50 MG tablet, Take 50 mg by mouth 4 (four) times daily as needed., Disp: , Rfl:     Physical Exam: Blood pressure 118/90, pulse 86, height 5\' 6"  (1.676 m), weight 170 lb 4 oz (77.2 kg), SpO2 99 %.    Affect appropriate Healthy:  appears stated age HEENT: normal Neck supple with no adenopathy JVP normal no bruits no thyromegaly Lungs clear with no wheezing and good diaphragmatic motion Heart:  S1/S2 no murmur, no rub, gallop or click PMI normal Abdomen: benighn, BS positve, no tenderness, no AAA no bruit.  No HSM or HJR Distal pulses intact with no bruits No edema Neuro non-focal Skin warm and dry No muscular weakness   Labs:   Lab Results  Component Value Date   WBC 9.8 08/16/2020   HGB 12.8 (L) 08/16/2020   HCT 37.2 (L) 08/16/2020   MCV 98.4 08/16/2020   PLT 279 08/16/2020   No results for input(s): NA, K, CL, CO2, BUN, CREATININE, CALCIUM, PROT, BILITOT, ALKPHOS, ALT, AST, GLUCOSE in the last 168 hours.  Invalid input(s): LABALBU Lab Results  Component Value Date   TROPONINI <0.03 10/19/2018   No results found for: CHOL No results found for: HDL No results found for: LDLCALC No results found for: TRIG No results found for: CHOLHDL No results found for: LDLDIRECT    Radiology: No results found.  EKG: SR rate 85 normal 09/2018  05/10/20 NSR normal ECG    ASSESSMENT AND PLAN:   1. Chest Pain:  Atypical does not complain about this  to me ECG normal no indication for stress testing  2. Anxiety/Depression:  Continue xanax and buspar f/u primary  3. HTN:  Well controlled.  Continue current medications and low sodium Dash type diet.   4. Tick Bite: no persistent rash Rx doxycycline f/u primary He complains of persistent fatigue/ malaise  And memory issues. F/U with primary ? Lyme titers and other rickettsial diseases tested for consider f/u CT/MRI for cerebritis  Will order echo to make sure EF normal given fatigue Normal cardiac exam and ECG   Echo if insurance covers  F/U Cardiology PRN   Signed: Charlton Haws 11/22/2021, 1:43 PM

## 2021-11-22 ENCOUNTER — Other Ambulatory Visit: Payer: Self-pay

## 2021-11-22 ENCOUNTER — Encounter: Payer: Self-pay | Admitting: Cardiovascular Disease

## 2021-11-22 ENCOUNTER — Ambulatory Visit (INDEPENDENT_AMBULATORY_CARE_PROVIDER_SITE_OTHER): Payer: 59 | Admitting: Cardiovascular Disease

## 2021-11-22 VITALS — BP 118/90 | HR 86 | Ht 66.0 in | Wt 170.2 lb

## 2021-11-22 DIAGNOSIS — R0602 Shortness of breath: Secondary | ICD-10-CM

## 2021-11-22 DIAGNOSIS — F1010 Alcohol abuse, uncomplicated: Secondary | ICD-10-CM | POA: Diagnosis not present

## 2021-11-22 DIAGNOSIS — I10 Essential (primary) hypertension: Secondary | ICD-10-CM

## 2021-11-22 NOTE — Patient Instructions (Signed)
Medication Instructions:  Your physician recommends that you continue on your current medications as directed. Please refer to the Current Medication list given to you today.  *If you need a refill on your cardiac medications before your next appointment, please call your pharmacy*   Lab Work: None If you have labs (blood work) drawn today and your tests are completely normal, you will receive your results only by: MyChart Message (if you have MyChart) OR A paper copy in the mail If you have any lab test that is abnormal or we need to change your treatment, we will call you to review the results.   Testing/Procedures: None   Follow-Up: At [facility], you and your health needs are our priority.  As part of our continuing mission to provide you with exceptional heart care, we have created designated Provider Care Teams.  These Care Teams include your primary Cardiologist (physician) and Advanced Practice Providers (APPs -  Physician Assistants and Nurse Practitioners) who all work together to provide you with the care you need, when you need it.  We recommend signing up for the patient portal called "MyChart".  Sign up information is provided on this After Visit Summary.  MyChart is used to connect with patients for Virtual Visits (Telemedicine).  Patients are able to view lab/test results, encounter notes, upcoming appointments, etc.  Non-urgent messages can be sent to your provider as well.   To learn more about what you can do with MyChart, go to [email or web address].    Your next appointment:   Follow Up- As Needed :1}    Other Instructions Please check your insurance to see if they will cover an Echocardiogram.

## 2022-02-19 ENCOUNTER — Ambulatory Visit: Payer: 59 | Admitting: Family Medicine

## 2022-02-26 ENCOUNTER — Ambulatory Visit: Payer: 59 | Admitting: Family Medicine

## 2022-03-23 ENCOUNTER — Other Ambulatory Visit: Payer: Self-pay | Admitting: Diagnostic Neuroimaging

## 2022-03-26 ENCOUNTER — Other Ambulatory Visit: Payer: Self-pay | Admitting: Diagnostic Neuroimaging

## 2022-03-30 ENCOUNTER — Telehealth: Payer: Self-pay | Admitting: Diagnostic Neuroimaging

## 2022-03-30 NOTE — Telephone Encounter (Signed)
Pt is needing a refill request for his levETIRAcetam (KEPPRA) 1000 MG tablet sent to the [facility] ?

## 2022-04-02 MED ORDER — LEVETIRACETAM 1000 MG PO TABS: 1000.0000 mg | ORAL_TABLET | Freq: Two times a day (BID) | ORAL | 0 refills | Status: DC

## 2022-04-02 NOTE — Telephone Encounter (Signed)
Refill has been sent as requested. Pt has f/u in July.  ?

## 2022-06-05 ENCOUNTER — Ambulatory Visit: Payer: 59 | Admitting: Family Medicine

## 2022-06-05 ENCOUNTER — Encounter: Payer: Self-pay | Admitting: Family Medicine

## 2022-06-05 ENCOUNTER — Ambulatory Visit: Payer: 59 | Admitting: Diagnostic Neuroimaging

## 2022-06-05 NOTE — Progress Notes (Deleted)
No chief complaint on file.   HISTORY OF PRESENT ILLNESS:  06/05/22 ALL:  Ricky Werner is a 56 y.o. male here today for follow up for seizures. He was seen in consult with Dr Marjory Lies 02/2021. Last reported seizure at that time 07/2020. He was continued on levetiracetam 1000mg  BID. ER note 05/19/2021 reports roommate found him on the floor with tongue injury and post ictal. He denied missed dosed of AED but had been drinking and using marijuana and methamphetamines.    HISTORY (copied from Dr 05/21/2021 previous note)  56 year old male here for evaluation of seizures.  September 2018 patient had suddenly stopped drinking alcohol (previously drinking fifth of liquor per day) and had alcohol withdrawal seizure.  Patient then resumed drinking 1-3 shots of liquor at night.  In September 2021 he had another seizure which apparently was unprovoked, and therefore he was started on levetiracetam 500 mg twice a day.  He followed up with outpatient neurology who increased to levetiracetam 1000 mg twice a day.  Patient has not had any seizures since that time.  He had CT and EEG which were unremarkable.  Patient's insurance changed and therefore patient referred here for further follow-up.   Has issues with chronic pain, anxiety, depression, insomnia, fatigue, pain, headaches and confusion.   REVIEW OF SYSTEMS: Out of a complete 14 system review of symptoms, the patient complains only of the following symptoms, and all other reviewed systems are negative.   ALLERGIES: No Known Allergies   HOME MEDICATIONS: Outpatient Medications Prior to Visit  Medication Sig Dispense Refill   alprazolam (XANAX) 2 MG tablet Take 2 mg by mouth 3 (three) times daily.     amLODipine (NORVASC) 10 MG tablet Take 10 mg by mouth daily.  6   aspirin EC 81 MG tablet Take 81 mg by mouth daily.     cloNIDine (CATAPRES) 0.2 MG tablet Take 0.2 mg by mouth 3 (three) times daily.     escitalopram (LEXAPRO) 10 MG tablet  Take 1 tablet by mouth daily.     Hyprom-Naphaz-Polysorb-Zn Sulf (CLEAR EYES COMPLETE OP) Place 1 drop into both eyes daily as needed.      levETIRAcetam (KEPPRA) 1000 MG tablet Take 1 tablet (1,000 mg total) by mouth 2 (two) times daily. 180 tablet 0   lisinopril (PRINIVIL,ZESTRIL) 20 MG tablet Take 20 mg by mouth 2 (two) times daily.  6   meloxicam (MOBIC) 7.5 MG tablet Take 7.5 mg by mouth 2 (two) times daily as needed.     Multiple Vitamin (MULTIVITAMIN WITH MINERALS) TABS tablet Take 1 tablet by mouth daily.     traMADol (ULTRAM) 50 MG tablet Take 50 mg by mouth 4 (four) times daily as needed.     No facility-administered medications prior to visit.     PAST MEDICAL HISTORY: 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    Chronic back pain    Depression    Headache    History of dental problems    Hypertension    Osteoarthritis    Panic attacks    Right tennis elbow    Sciatica    Seizures (HCC) 09/30/2020   Vertigo      PAST SURGICAL HISTORY: Past Surgical History:  Procedure Laterality Date   KIDNEY STONE SURGERY       FAMILY HISTORY: Family History  Problem Relation  Age of Onset   Hypertension Mother    Hyperlipidemia Mother    Diabetes Mother    Breast cancer Mother    Hypertension Brother    Stroke Father    Hypertension Father      SOCIAL HISTORY: Social History   Socioeconomic History   Marital status: Divorced    Spouse name: Not on file   Number of children: Not on file   Years of education: Not on file   Highest education level: Not on file  Occupational History   Not on file  Tobacco Use   Smoking status: Every Day    Packs/day: 1.00    Years: 39.00    Total pack years: 39.00    Types: Cigarettes   Smokeless tobacco: Never  Vaping Use   Vaping Use: Never used  Substance and Sexual Activity   Alcohol use: Yes    Alcohol/week: 6.0  standard drinks of alcohol    Types: 6 Cans of beer per week    Comment: pt describes depends on what week may have 5th of liqour within a week and go a week without   Drug use: Yes    Types: Marijuana    Comment: occ   Sexual activity: Not on file  Other Topics Concern   Not on file  Social History Narrative   Lives alone   Social Determinants of Health   Financial Resource Strain: Not on file  Food Insecurity: Not on file  Transportation Needs: Not on file  Physical Activity: Not on file  Stress: Not on file  Social Connections: Not on file  Intimate Partner Violence: Not on file     PHYSICAL EXAM  There were no vitals filed for this visit. There is no height or weight on file to calculate BMI.  Generalized: Well developed, in no acute distress  Cardiology: normal rate and rhythm, no murmur auscultated  Respiratory: clear to auscultation bilaterally    Neurological examination  Mentation: Alert oriented to time, place, history taking. Follows all commands speech and language fluent Cranial nerve II-XII: Pupils were equal round reactive to light. Extraocular movements were full, visual field were full on confrontational test. Facial sensation and strength were normal. Uvula tongue midline. Head turning and shoulder shrug  were normal and symmetric. Motor: The motor testing reveals 5 over 5 strength of all 4 extremities. Good symmetric motor tone is noted throughout.  Sensory: Sensory testing is intact to soft touch on all 4 extremities. No evidence of extinction is noted.  Coordination: Cerebellar testing reveals good finger-nose-finger and heel-to-shin bilaterally.  Gait and station: Gait is normal. Tandem gait is normal. Romberg is negative. No drift is seen.  Reflexes: Deep tendon reflexes are symmetric and normal bilaterally.    DIAGNOSTIC DATA (LABS, IMAGING, TESTING) - I reviewed patient records, labs, notes, testing and imaging myself where available.  Lab  Results  Component Value Date   WBC 9.8 08/16/2020   HGB 12.8 (L) 08/16/2020   HCT 37.2 (L) 08/16/2020   MCV 98.4 08/16/2020   PLT 279 08/16/2020      Component Value Date/Time   NA 138 08/16/2020 1527   K 3.5 08/16/2020 1527   CL 105 08/16/2020 1527   CO2 22 08/16/2020 1527   GLUCOSE 124 (H) 08/16/2020 1527   BUN 11 08/16/2020 1527   CREATININE 0.71 08/16/2020 1527   CALCIUM 9.3 08/16/2020 1527   PROT 7.2 08/16/2020 1527   ALBUMIN 4.2 08/16/2020 1527   AST 28 08/16/2020  1527   ALT 30 08/16/2020 1527   ALKPHOS 34 (L) 08/16/2020 1527   BILITOT 0.7 08/16/2020 1527   GFRNONAA >60 08/16/2020 1527   GFRAA >60 08/16/2020 1527   No results found for: "CHOL", "HDL", "LDLCALC", "LDLDIRECT", "TRIG", "CHOLHDL" No results found for: "HGBA1C" No results found for: "VITAMINB12" No results found for: "TSH"      No data to display               No data to display           ASSESSMENT AND PLAN  56 y.o. year old male  has a past medical history of Alcohol abuse, Anxiety, BPH (benign prostatic hyperplasia), Carpal tunnel syndrome on right, Chronic back pain, Depression, Headache, History of dental problems, Hypertension, Osteoarthritis, Panic attacks, Right tennis elbow, Sciatica, Seizures (HCC) (09/30/2020), and Vertigo. here with    No diagnosis found.  Eustace Pen ***.  Healthy lifestyle habits encouraged. *** will follow up with PCP as directed. *** will return to see me in ***, sooner if needed. *** verbalizes understanding and agreement with this plan.   No orders of the defined types were placed in this encounter.    No orders of the defined types were placed in this encounter.    Shawnie Dapper, MSN, FNP-C 06/05/2022, 7:18 AM  [facility] [address], [address] [place], [place] [address] [phone]

## 2022-08-21 DIAGNOSIS — Z0271 Encounter for disability determination: Secondary | ICD-10-CM

## 2022-11-07 DIAGNOSIS — G894 Chronic pain syndrome: Secondary | ICD-10-CM | POA: Diagnosis not present

## 2022-11-07 DIAGNOSIS — M1991 Primary osteoarthritis, unspecified site: Secondary | ICD-10-CM | POA: Diagnosis not present

## 2022-11-07 DIAGNOSIS — I10 Essential (primary) hypertension: Secondary | ICD-10-CM | POA: Diagnosis not present

## 2022-12-18 DIAGNOSIS — I10 Essential (primary) hypertension: Secondary | ICD-10-CM | POA: Diagnosis not present

## 2022-12-18 DIAGNOSIS — G894 Chronic pain syndrome: Secondary | ICD-10-CM | POA: Diagnosis not present

## 2022-12-18 DIAGNOSIS — R569 Unspecified convulsions: Secondary | ICD-10-CM | POA: Diagnosis not present

## 2022-12-18 DIAGNOSIS — R69 Illness, unspecified: Secondary | ICD-10-CM | POA: Diagnosis not present

## 2023-01-09 NOTE — Progress Notes (Deleted)
CARDIOLOGY CONSULT NOTE       Patient ID: Ricky Werner MRN: [id number] DOB/AGE: Dec 11, 1965 57 y.o.  Admit date: (Not on file) Referring Physician: Jennings  Primary Physician: Redmond School, MD Primary Cardiologist: Johnsie Cancel   HPI:  57 y.o. with history of anxiety, ETOH abuse, HTN depression panic attacks and seizures. Referred by [facility] June 2021 for ? chest pain. No previously documented CAD Seen by primary 03/30/20 complained of chest pain Onset for 1-2 weeks Moderate pressure sensation in substernal area No associated dyspnea palpitations or syncope On xanax and buspar for anxiety.  On norvasc and zestril for BP Smokes 1 ppd Had a tick bite and was covered with doxycycline On my interview he never mentioned any chest pain   His primary complaint is malaise/fatigue and memory issues since having tick bites. 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 He still smokes and uses CBD oil regularly Use to smoke a lot of marijuana. 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  Did not get echo previously due to insurance   ***   ROS All other systems reviewed and negative except as noted above  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    Chronic back pain    Depression    Headache    History of dental problems    Hypertension    Osteoarthritis    Panic attacks    Right tennis elbow    Sciatica    Seizures ([place]) 09/30/2020   Vertigo     Family History  Problem Relation Age of Onset   Hypertension Mother    Hyperlipidemia Mother    Diabetes  Mother    Breast cancer Mother    Hypertension Brother    Stroke Father    Hypertension Father     Social History   Socioeconomic History   Marital status: Divorced    Spouse name: Not on file   Number of children: Not on file   Years of education: Not on file   Highest education level: Not on file  Occupational History   Not on file  Tobacco Use   Smoking status: Every Day    Packs/day: 1.00    Years: 39.00    Total pack years: 39.00    Types: Cigarettes   Smokeless tobacco: Never  Vaping Use   Vaping Use: Never used  Substance and Sexual Activity   Alcohol use: Yes    Alcohol/week: 6.0 standard drinks of alcohol    Types: 6 Cans of beer per week    Comment: pt describes depends on what week may have 5th of liqour within a week and go a week without   Drug use: Yes    Types: Marijuana    Comment: occ   Sexual activity: Not on file  Other Topics Concern   Not on file  Social History Narrative   Lives alone   Social Determinants of Health   Financial Resource Strain: Not on file  Food Insecurity: Not on file  Transportation Needs: Not on file  Physical Activity: Not on file  Stress: Not on file  Social Connections: Not on file  Intimate Partner Violence: Not on file    Past Surgical History:  Procedure Laterality Date   KIDNEY STONE SURGERY        Current Outpatient Medications:    alprazolam (XANAX) 2 MG tablet, Take 2 mg by mouth 3 (three) times daily., Disp: , Rfl:    amLODipine (NORVASC) 10 MG tablet, Take 10 mg by mouth daily., Disp: , Rfl: 6   aspirin EC 81 MG tablet, Take 81 mg by mouth daily., Disp: , Rfl:    cloNIDine (CATAPRES) 0.2 MG tablet, Take 0.2 mg by mouth 3 (three) times daily., Disp: , Rfl:    escitalopram (LEXAPRO) 10 MG tablet, Take 1 tablet by mouth daily., Disp: , Rfl:    Hyprom-Naphaz-Polysorb-Zn Sulf (CLEAR EYES COMPLETE OP), Place 1 drop into both eyes daily as needed. , Disp: , Rfl:    levETIRAcetam (KEPPRA) 1000 MG tablet, Take  1 tablet (1,000 mg total) by mouth 2 (two) times daily., Disp: 180 tablet, Rfl: 0   lisinopril (PRINIVIL,ZESTRIL) 20 MG tablet, Take 20 mg by mouth 2 (two) times daily., Disp: , Rfl: 6   meloxicam (MOBIC) 7.5 MG tablet, Take 7.5 mg by mouth 2 (two) times daily as needed., Disp: , Rfl:    Multiple Vitamin (MULTIVITAMIN WITH MINERALS) TABS tablet, Take 1 tablet by mouth daily., Disp: , Rfl:    traMADol (ULTRAM) 50 MG tablet, Take 50 mg by mouth 4 (four) times daily as needed., Disp: , Rfl:     Physical Exam: There were no vitals taken for this visit.    Affect appropriate Healthy:  appears stated age 67: normal Neck supple with no adenopathy JVP normal no bruits no thyromegaly Lungs clear with no wheezing and good diaphragmatic motion Heart:  S1/S2 no murmur, no rub, gallop or click PMI normal Abdomen: benighn, BS positve, no tenderness, no AAA no bruit.  No HSM or HJR Distal pulses intact with no bruits No edema Neuro non-focal Skin warm and dry No muscular weakness   Labs:   Lab Results  Component Value Date   WBC 9.8 08/16/2020   HGB 12.8 (L) 08/16/2020   HCT 37.2 (L) 08/16/2020   MCV 98.4 08/16/2020   PLT 279 08/16/2020   No results for input(s): "NA", "K", "CL", "CO2", "BUN", "CREATININE", "CALCIUM", "PROT", "BILITOT", "ALKPHOS", "ALT", "AST", "GLUCOSE" in the last 168 hours.  Invalid input(s): "LABALBU" Lab Results  Component Value Date   TROPONINI <0.03 10/19/2018   No results found for: "CHOL" No results found for: "HDL" No results found for: "Ekron" No results found for: "TRIG" No results found for: "CHOLHDL" No results found for: "LDLDIRECT"    Radiology: No results found.  EKG: SR rate 85 normal 09/2018  05/10/20 NSR normal ECG    ASSESSMENT AND PLAN:   1. Chest Pain:  Atypical does not complain about this to me ECG normal no indication for stress testing  2. Anxiety/Depression:  Continue xanax and buspar f/u primary  3. HTN:  Well controlled.   Continue current medications and low sodium Dash type diet.   4. Tick Bite: no persistent rash Rx doxycycline f/u primary He complains of persistent fatigue/ malaise And memory issues. F/U with primary ? Lyme titers and other rickettsial diseases tested for consider f/u CT/MRI for cerebritis  Will order echo to make sure EF normal given fatigue Normal cardiac exam and  ECG   Echo if insurance covers  F/U Cardiology PRN   Signed: Jenkins Rouge 01/09/2023, 5:18 PM

## 2023-01-11 ENCOUNTER — Ambulatory Visit: Payer: Medicaid Other | Admitting: Cardiovascular Disease

## 2023-02-05 ENCOUNTER — Encounter: Payer: Self-pay | Admitting: Neurology

## 2023-02-14 NOTE — Progress Notes (Deleted)
Cardiology Office Note   Date:  02/14/2023   ID:  Ricky Werner, DOB 12/07/1965, MRN [id number]  PCP:  Redmond School, MD  Cardiologist:  Dr. Johnsie Cancel    No chief complaint on file.     History of Present Illness: Ricky Werner is a 57 y.o. male who presents for ***  Last seen 11/22/21   istory of anxiety, ETOH abuse, HTN depression panic attacks and seizures. Referred by [facility] June 2021 for ? chest pain. No previously documented CAD Seen by primary 03/30/20 complained of chest pain Onset for 1-2 weeks Moderate pressure sensation in substernal area No associated dyspnea palpitations or syncope On xanax and buspar for anxiety.  On norvasc and zestril for BP Smokes 1 ppd Had a tick bite and was covered with doxycycline On my interview he never mentioned any chest pain    His primary complaint is malaise/fatigue and memory issues since having tick bites. 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 He still smokes and uses CBD oil regularly Use to smoke a lot of marijuana. 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   Did not get echo previously due to insurance and does not want one now until he checks with his current insurance   Chest pain atypical, HTN  tick bite Echo   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    Chronic back pain    Depression    Headache    History of dental problems    Hypertension    Osteoarthritis    Panic attacks    Right tennis elbow    Sciatica    Seizures ([place]) 09/30/2020   Vertigo     Past Surgical History:  Procedure  Laterality Date   KIDNEY STONE SURGERY       Current Outpatient Medications  Medication Sig Dispense Refill   alprazolam (XANAX) 2 MG tablet Take 2 mg by mouth 3 (three) times daily.     amLODipine (NORVASC) 10 MG tablet Take 10 mg by mouth daily.  6   aspirin EC 81 MG tablet Take 81 mg by mouth daily.     cloNIDine (CATAPRES) 0.2 MG tablet Take 0.2 mg by mouth 3 (three) times daily.     escitalopram (LEXAPRO) 10 MG tablet Take 1 tablet by mouth daily.     Hyprom-Naphaz-Polysorb-Zn Sulf (CLEAR EYES COMPLETE OP) Place 1 drop into both eyes daily as needed.      levETIRAcetam (KEPPRA) 1000 MG tablet Take 1 tablet (1,000 mg total) by mouth 2 (two) times daily. 180 tablet 0   lisinopril (PRINIVIL,ZESTRIL) 20 MG tablet Take 20 mg by mouth 2 (two) times daily.  6   meloxicam (MOBIC) 7.5 MG tablet Take 7.5 mg by mouth 2 (two) times daily as needed.     Multiple Vitamin (MULTIVITAMIN WITH MINERALS) TABS tablet Take 1 tablet by mouth daily.     traMADol (ULTRAM) 50 MG tablet Take 50 mg by mouth 4 (four) times daily as needed.  No current facility-administered medications for this visit.    Allergies:   Patient has no known allergies.    Social History:  The patient  reports that he has been smoking cigarettes. He has a 39.00 pack-year smoking history. He has never used smokeless tobacco. He reports current alcohol use of about 6.0 standard drinks of alcohol per week. He reports current drug use. Drug: Marijuana.   Family History:  The patient's ***family history includes Breast cancer in his mother; Diabetes in his mother; Hyperlipidemia in his mother; Hypertension in his brother, father, and mother; Stroke in his father.    ROS:  General:no colds or fevers, no weight changes Skin:no rashes or ulcers HEENT:no blurred vision, no congestion CV:see HPI PUL:see HPI GI:no diarrhea constipation or melena, no indigestion GU:no hematuria, no dysuria MS:no joint pain, no claudication Neuro:no  syncope, no lightheadedness Endo:no diabetes, no thyroid disease Wt Readings from Last 3 Encounters:  11/22/21 170 lb 4 oz (77.2 kg)  05/19/21 164 lb 0.4 oz (74.4 kg)  02/24/21 164 lb (74.4 kg)     PHYSICAL EXAM: VS:  There were no vitals taken for this visit. , BMI There is no height or weight on file to calculate BMI. General:Pleasant affect, NAD Skin:Warm and dry, brisk capillary refill HEENT:normocephalic, sclera clear, mucus membranes moist Neck:supple, no JVD, no bruits  Heart:S1S2 RRR without murmur, gallup, rub or click Lungs:clear without rales, rhonchi, or wheezes [id number], non tender, + BS, do not palpate liver spleen or masses Ext:no lower ext edema, 2+ pedal pulses, 2+ radial pulses Neuro:alert and oriented, MAE, follows commands, + facial symmetry    EKG:  EKG is ordered today. The ekg ordered today demonstrates ***   Recent Labs: No results found for requested labs within last 365 days.    Lipid Panel No results found for: "CHOL", "TRIG", "HDL", "CHOLHDL", "VLDL", "LDLCALC", "LDLDIRECT"     Other studies Reviewed: Additional studies/ records that were reviewed today include: ***.   ASSESSMENT AND PLAN:  1.  ***   Current medicines are reviewed with the patient today.  The patient Has no concerns regarding medicines.  The following changes have been made:  See above Labs/ tests ordered today include:see above  Disposition:   FU:  see above  Signed, Cecilie Kicks, NP  02/14/2023 9:45 PM    [facility] [place], [place], [place] Phone: [phone]; Fax: [phone]

## 2023-02-15 ENCOUNTER — Ambulatory Visit: Payer: Medicaid Other | Admitting: Cardiology

## 2023-02-21 ENCOUNTER — Emergency Department (HOSPITAL_COMMUNITY)
Admission: EM | Admit: 2023-02-21 | Discharge: 2023-02-21 | Disposition: A | Discharge status: Home / Self Care | Payer: Medicaid Other | Attending: Emergency Medicine | Admitting: Emergency Medicine

## 2023-02-21 ENCOUNTER — Encounter (HOSPITAL_COMMUNITY): Payer: Self-pay | Admitting: Emergency Medicine

## 2023-02-21 ENCOUNTER — Other Ambulatory Visit: Payer: Self-pay

## 2023-02-21 ENCOUNTER — Emergency Department (HOSPITAL_COMMUNITY): Payer: Medicaid Other

## 2023-02-21 DIAGNOSIS — F17210 Nicotine dependence, cigarettes, uncomplicated: Secondary | ICD-10-CM | POA: Insufficient documentation

## 2023-02-21 DIAGNOSIS — I10 Essential (primary) hypertension: Secondary | ICD-10-CM | POA: Insufficient documentation

## 2023-02-21 DIAGNOSIS — M545 Low back pain, unspecified: Secondary | ICD-10-CM | POA: Diagnosis not present

## 2023-02-21 MED ORDER — NAPROXEN 500 MG PO TABS: 500.0000 mg | ORAL_TABLET | Freq: Two times a day (BID) | ORAL | 0 refills | Status: DC

## 2023-02-21 MED ORDER — METHOCARBAMOL 500 MG PO TABS: 500.0000 mg | ORAL_TABLET | Freq: Three times a day (TID) | ORAL | 0 refills | Status: DC | PRN

## 2023-02-21 MED ORDER — KETOROLAC TROMETHAMINE 15 MG/ML IJ SOLN
30.0000 mg | Freq: Once | INTRAMUSCULAR | Status: AC
  Administered 2023-02-21: 30 mg via INTRAMUSCULAR
  Filled 2023-02-21: qty 2

## 2023-02-21 NOTE — Discharge Instructions (Signed)
You were evaluated in the Emergency Department and after careful evaluation, we did not find any emergent condition requiring admission or further testing in the hospital.  Your exam/testing today is overall reassuring.  X-ray without signs of broken bones or any other emergencies.  Suspect your pain is related to a muscle strain or spasm.  Recommend using the Naprosyn twice daily as prescribed for pain.  Can use the Robaxin muscle relaxer for more significant pain, best used at night if you are having trouble sleeping as it can cause drowsiness.  Please return to the Emergency Department if you experience any worsening of your condition.   Thank you for allowing [place] to be a part of your care.

## 2023-02-21 NOTE — ED Provider Notes (Signed)
[facility] Emergency Department Provider Note MRN:  [id number]  Arrival date & time: 02/21/23     Chief Complaint   Back Pain   History of Present Illness   Ricky Werner is a 57 y.o. year-old male with a history of hyper tension, seizures presenting to the ED with chief complaint of back pain.  Several days ago patient stumbled on some uneven turf and felt a mild discomfort in his left lower back.  Getting worse and worse since that time.  Also had a fall onto his back few days later.  Back hurts and is worse with motion or any movement.  No bowel or bladder dysfunction, no numbness or weakness to the legs.  Review of Systems  A thorough review of systems was obtained and all systems are negative except as noted in the HPI and PMH.   Patient's Health History    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    Chronic back pain    Depression    Headache    History of dental problems    Hypertension    Osteoarthritis    Panic attacks    Right tennis elbow    Sciatica    Seizures ([place]) 09/30/2020   Vertigo     Past Surgical History:  Procedure Laterality Date   KIDNEY STONE SURGERY      Family History  Problem Relation Age of Onset   Hypertension Mother    Hyperlipidemia Mother    Diabetes Mother    Breast cancer Mother    Hypertension Brother    Stroke Father    Hypertension Father     Social History   Socioeconomic History   Marital status: Divorced    Spouse name: Not on file   Number of children: Not on file   Years of education: Not on file   Highest education level: Not on file  Occupational History   Not on file  Tobacco Use   Smoking status: Every Day    Packs/day: 1.00    Years: 39.00    Additional pack years: 0.00    Total pack years: 39.00    Types: Cigarettes   Smokeless tobacco: Never  Vaping Use   Vaping Use: Never  used  Substance and Sexual Activity   Alcohol use: Yes    Alcohol/week: 6.0 standard drinks of alcohol    Types: 6 Cans of beer per week    Comment: pt describes depends on what week may have 5th of liqour within a week and go a week without   Drug use: Yes    Types: Marijuana    Comment: occ   Sexual activity: Not on file  Other Topics Concern   Not on file  Social History Narrative   Lives alone   Social Determinants of Health   Financial Resource Strain: Not on file  Food Insecurity: Not on file  Transportation Needs: Not on file  Physical Activity: Not on file  Stress: Not on file  Social Connections: Not on file  Intimate Partner Violence: Not on file     Physical Exam   Vitals:   02/21/23 0535 02/21/23 0600  BP: (!) 126/94 120/88  Pulse: 81 69  Resp: 18 16  Temp: 98 F (36.7 C)   SpO2: 100% 96%    CONSTITUTIONAL: Well-appearing, NAD NEURO/PSYCH:  Alert  and oriented x 3, no focal deficits EYES:  eyes equal and reactive ENT/NECK:  no LAD, no JVD CARDIO: Regular rate, well-perfused, normal S1 and S2 PULM:  CTAB no wheezing or rhonchi GI/GU:  non-distended, non-tender MSK/SPINE:  No gross deformities, no edema SKIN:  no rash, atraumatic   *Additional and/or pertinent findings included in MDM below  Diagnostic and Interventional Summary    EKG Interpretation  Date/Time:    Ventricular Rate:    PR Interval:    QRS Duration:   QT Interval:    QTC Calculation:   R Axis:     Text Interpretation:         Labs Reviewed - No data to display  DG Lumbar Spine Complete  Final Result      Medications  ketorolac (TORADOL) 15 MG/ML injection 30 mg (30 mg Intramuscular Given 02/21/23 0609)     Procedures  /  Critical Care Procedures  ED Course and Medical Decision Making  Initial Impression and Ddx Suspect lumbar sprain or strain, fracture also considered given the fall.  No signs or symptoms of myelopathy.  Past medical/surgical history that  increases complexity of ED encounter: None  Interpretation of Diagnostics I personally reviewed the lumbar x-ray and my interpretation is as follows: No fracture    Patient Reassessment and Ultimate Disposition/Management     Discharge  Patient management required discussion with the following services or consulting groups:  None  Complexity of Problems Addressed Acute complicated illness or Injury  Additional Data Reviewed and Analyzed Further history obtained from: None  Additional Factors Impacting ED Encounter Risk Prescriptions  Barth Kirks. Sedonia Small, MD [place] [email or web address]  Final Clinical Impressions(s) / ED Diagnoses     ICD-10-CM   1. Acute left-sided low back pain without sciatica  M54.50       ED Discharge Orders          Ordered    naproxen (NAPROSYN) 500 MG tablet  2 times daily        02/21/23 0638    methocarbamol (ROBAXIN) 500 MG tablet  Every 8 hours PRN        02/21/23 [id number]             Discharge Instructions Discussed with and Provided to Patient:     Discharge Instructions      You were evaluated in the Emergency Department and after careful evaluation, we did not find any emergent condition requiring admission or further testing in the hospital.  Your exam/testing today is overall reassuring.  X-ray without signs of broken bones or any other emergencies.  Suspect your pain is related to a muscle strain or spasm.  Recommend using the Naprosyn twice daily as prescribed for pain.  Can use the Robaxin muscle relaxer for more significant pain, best used at night if you are having trouble sleeping as it can cause drowsiness.  Please return to the Emergency Department if you experience any worsening of your condition.   Thank you for allowing [place] to be a part of your care.       Maudie Flakes, MD 02/21/23 [phone]

## 2023-02-21 NOTE — ED Triage Notes (Signed)
Pt c/o lower back pain after falling x 2 weeks ago. Pt states that he has been taking tramadol without relief.

## 2023-03-12 ENCOUNTER — Ambulatory Visit: Payer: Medicaid Other | Admitting: Neurology

## 2023-03-12 ENCOUNTER — Encounter: Payer: Self-pay | Admitting: Neurology

## 2023-03-12 VITALS — BP 110/70 | HR 92 | Ht 66.0 in | Wt 170.0 lb

## 2023-03-12 DIAGNOSIS — G40009 Localization-related (focal) (partial) idiopathic epilepsy and epileptic syndromes with seizures of localized onset, not intractable, without status epilepticus: Secondary | ICD-10-CM | POA: Diagnosis not present

## 2023-03-12 DIAGNOSIS — R413 Other amnesia: Secondary | ICD-10-CM | POA: Diagnosis not present

## 2023-03-12 DIAGNOSIS — G43009 Migraine without aura, not intractable, without status migrainosus: Secondary | ICD-10-CM

## 2023-03-12 MED ORDER — LEVETIRACETAM 1000 MG PO TABS: 1000.0000 mg | ORAL_TABLET | Freq: Two times a day (BID) | ORAL | 3 refills | Status: AC

## 2023-03-12 NOTE — Progress Notes (Signed)
NEUROLOGY CONSULTATION NOTE  Ricky Werner MRN: [id number] DOB: [birth year]  Referring provider: Dr. Elfredia Nevins Primary care provider: Dr. Elfredia Nevins  Reason for consult:  seizures, speech difficulty, memory loss, vertigo, blackouts  Dear Dr Sherwood Gambler:  Thank you for your kind referral of Ricky Werner for consultation of the above symptoms. Although his history is well known to you, please allow me to reiterate it for the purpose of our medical record. He is alone in the office today. Records and images were personally reviewed where available.   HISTORY OF PRESENT ILLNESS: This is a 57 year old right-handed man with a history of hypertension, chronic back pain, anxiety, alcohol abuse, presenting for evaluation of seizures, speech difficulty, memory loss, vertigo, blackouts. He is a poor historian and needs redirection to obtain history. He reports that in August 2018, he was drinking hard liquor, "just quit" and ended up in the ER 5 days later with DTs. Per notes, he had an alcohol withdrawal seizure. He then resumed drinking liquor and in September 2021 had another seizure that was apparently unprovoked and was started on Levetiracetam 500mg  BID. He saw Dr. Gerilyn Pilgrim and dose was increased to 1000mg  BID. EEG reportedly normal. He reports episodes of passing out with no prior warning, the last time his son found him on the floor. Some time last year, he bit his tongue and broke 2 front teeth from passing out. He seems to always fall to the left. He also reports an incident last year where he was found by the sheriff but he had no recollection how he got there.   He reports that prior to the first alcohol withdrawal seizure in 2018, he was having episodes where he felt like someone "hit me upside on head with a 2x4" with vertigo and left ear being affected. He has a spinning sensation if he turns his head or looks up. He takes deep breaths and it passes after a few minutes but some have  caused him to fall. He has occasional double vision sometimes separate from the dizziness. For the past 3 years, he has been having body jerks "everywhere" that have been getting worse lately. He denies any olfactory/gustatory hallucinations. Sometimes he sees a shadow on his right side and feels like somebody is watching him. This occurs in daytime and night time. He endorses memory loss, he has to lift his hand up to alert people to slow down during conversations. He mostly lives alone but his son is also there. He denies forgetting medications. His son manages finances. He does not drive, he turned his license in. He reports forgetting what he came to get in a room so he keeps repeating to himself what he needs to go when going to a room. He denies any focal numbness/tingling but if he sits for prolonged periods, his arms or legs start getting numb. He has migraines with right frontal pressure a couple of times a week. No nausea/vomiting, photo/phonophobia. He takes Advil or Tramadol. He has chronic pain in his neck, back, shoulders, hips, and knees. He has been taking Tramadol 50mg  2 tabs BID since 2020. He takes Xanax 2mg  for anxiety, usually he takes it BID at least, cutting it in half throughout the day since 2000. Mood is pretty good for now. He reports cutting down alcohol to 3 cans of Smir[place]ff 12 oz a week. He denies any side effects on Levetiracetam 1000mg  BID and feels things are good with it.   His mother  has migraines, Alzheimer's disease, and possibly seizures. He has a history of head injuries. He had a normal birth and early development.  There is no history of febrile convulsions, CNS infections such as meningitis/encephalitis, neurosurgical procedures.   PAST MEDICAL HISTORY: 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    Chronic back pain    Depression    Headache    History of dental  problems    Hypertension    Osteoarthritis    Panic attacks    Right tennis elbow    Sciatica    Seizures 09/30/2020   Vertigo     PAST SURGICAL HISTORY: Past Surgical History:  Procedure Laterality Date   KIDNEY STONE SURGERY      MEDICATIONS: Current Outpatient Medications on File Prior to Visit  Medication Sig Dispense Refill   alprazolam (XANAX) 2 MG tablet Take 2 mg by mouth 3 (three) times daily.     amLODipine (NORVASC) 10 MG tablet Take 10 mg by mouth daily.  6   aspirin EC 81 MG tablet Take 81 mg by mouth daily.     cloNIDine (CATAPRES) 0.2 MG tablet Take 0.2 mg by mouth 3 (three) times daily.     escitalopram (LEXAPRO) 10 MG tablet Take 1 tablet by mouth daily.     Hyprom-Naphaz-Polysorb-Zn Sulf (CLEAR EYES COMPLETE OP) Place 1 drop into both eyes daily as needed.      levETIRAcetam (KEPPRA) 1000 MG tablet Take 1 tablet (1,000 mg total) by mouth 2 (two) times daily. 180 tablet 0   lisinopril (PRINIVIL,ZESTRIL) 20 MG tablet Take 20 mg by mouth 2 (two) times daily.  6   meloxicam (MOBIC) 7.5 MG tablet Take 7.5 mg by mouth 2 (two) times daily as needed.     methocarbamol (ROBAXIN) 500 MG tablet Take 1 tablet (500 mg total) by mouth every 8 (eight) hours as needed for muscle spasms. 30 tablet 0   Multiple Vitamin (MULTIVITAMIN WITH MINERALS) TABS tablet Take 1 tablet by mouth daily.     naproxen (NAPROSYN) 500 MG tablet Take 1 tablet (500 mg total) by mouth 2 (two) times daily. 30 tablet 0   traMADol (ULTRAM) 50 MG tablet Take 50 mg by mouth 4 (four) times daily as needed.     No current facility-administered medications on file prior to visit.    ALLERGIES: No Known Allergies  FAMILY HISTORY: Family History  Problem Relation Age of Onset   Hypertension Mother    Hyperlipidemia Mother    Diabetes Mother    Breast cancer Mother    Hypertension Brother    Stroke Father    Hypertension Father     SOCIAL HISTORY: Social History   Socioeconomic History    Marital status: Divorced    Spouse name: Not on file   Number of children: Not on file   Years of education: Not on file   Highest education level: Not on file  Occupational History   Not on file  Tobacco Use   Smoking status: Every Day    Packs/day: 1.00    Years: 39.00    Additional pack years: 0.00    Total pack years: 39.00    Types: Cigarettes   Smokeless tobacco: Never  Vaping Use   Vaping Use: Never used  Substance and Sexual Activity   Alcohol use: Yes    Alcohol/week: 6.0 standard drinks  of alcohol    Types: 6 Cans of beer per week    Comment: pt describes depends on what week may have 5th of liqour within a week and go a week without   Drug use: Yes    Types: Marijuana    Comment: occ   Sexual activity: Not on file  Other Topics Concern   Not on file  Social History Narrative   Lives with youngest son but he works 3rd shift   2018 stopped drinking cold [place] went into [facility] started having seizures    Is not working at this time    Lives in 1 story no steps    Social Determinants of Corporate investment banker Strain: Not on file  Food Insecurity: Not on file  Transportation Needs: Not on file  Physical Activity: Not on file  Stress: Not on file  Social Connections: Not on file  Intimate Partner Violence: Not on file     PHYSICAL EXAM: Vitals:   03/12/23 1035  BP: 110/70  Pulse: 92  SpO2: 97%   General: No acute distress Head:  Normocephalic/atraumatic, poor dentition Skin/Extremities: No rash, no edema Neurological Exam: Mental status: alert and oriented to person, place, and time, no dysarthria or aphasia, Fund of knowledge is appropriate.  Recent and remote memory are impaired, 1/3 delayed recall. Attention and concentration are reduced, needs redirection.  Cranial nerves: CN I: not tested CN II: pupils equal, round, visual fields intact CN III, IV, VI:  full range of motion, no nystagmus, no ptosis CN V: facial sensation intact CN VII: upper  and lower face symmetric CN VIII: hearing intact to conversation Bulk & Tone: normal, no fasciculations. Motor: 5/5 throughout with no pronator drift. Sensation: intact to light touch, cold, vibration sense.  No extinction to double simultaneous stimulation.  Deep Tendon Reflexes: +2 throughout Cerebellar: no incoordination on finger to nose testing Gait: favors right leg when walking due to pain, no ataxia Tremor: none   IMPRESSION: This is a 57 year old right-handed man with a history of hypertension, chronic back pain, anxiety, alcohol abuse, presenting for evaluation of seizures, speech difficulty, memory loss, vertigo, blackouts. Etiology of seizures unclear, it appears initially he was having alcohol withdrawal seizures but has had seizures that may have been unprovoked as well where he has gaps in time and wakes up on the ground. MRI brain with and without contrast will be ordered to assess for underlying structural abnormality. We will do a 1-hour EEG. He is a poor historian but it appears he has not had any seizures in a year, continue Levetiracetam 1000mg  BID. He does not drive. Follow-up in 6 months, call for any changes.    Thank you for allowing me to participate in the care of this patient. Please do not hesitate to call for any questions or concerns.   Patrcia Dolly, M.D.  CC: Dr. Sherwood Gambler

## 2023-03-12 NOTE — Patient Instructions (Addendum)
Schedule MRI brain with and without contrast  2. Schedule 1-hour EEG  3. Continue Keppra  twice a day  4. Follow-up in 6 months, call for any changes   Seizure Precautions: 1. If medication has been prescribed for you to prevent seizures, take it exactly as directed.  Do not stop taking the medicine without talking to your doctor first, even if you have not had a seizure in a long time.   2. Avoid activities in which a seizure would cause danger to yourself or to others.  Don't operate dangerous machinery, swim alone, or climb in high or dangerous places, such as on ladders, roofs, or girders.  Do not drive unless your doctor says you may.  3. If you have any warning that you may have a seizure, lay down in a safe place where you can't hurt yourself.    4.  No driving for 6 months from last seizure, as per [facility].   Please refer to the following link on the Epilepsy Foundation of America's website for more information: http://www.epilepsyfoundation.org/answerplace/Social/driving/drivingu.cfm   5.  Maintain good sleep hygiene. Minimize alcohol intake  6.  Contact your doctor if you have any problems that may be related to the medicine you are taking.  7.  Call 911 and bring the patient back to the ED if:        A.  The seizure lasts longer than 5 minutes.       B.  The patient doesn't awaken shortly after the seizure  C.  The patient has new problems such as difficulty seeing, speaking or moving  D.  The patient was injured during the seizure  E.  The patient has a temperature over 102 F (39C)  F.  The patient vomited and now is having trouble breathing

## 2023-03-13 ENCOUNTER — Ambulatory Visit: Payer: Medicaid Other | Admitting: Neurology

## 2023-03-13 DIAGNOSIS — G40009 Localization-related (focal) (partial) idiopathic epilepsy and epileptic syndromes with seizures of localized onset, not intractable, without status epilepticus: Secondary | ICD-10-CM | POA: Diagnosis not present

## 2023-03-13 DIAGNOSIS — G43009 Migraine without aura, not intractable, without status migrainosus: Secondary | ICD-10-CM

## 2023-03-13 DIAGNOSIS — R413 Other amnesia: Secondary | ICD-10-CM | POA: Diagnosis not present

## 2023-03-13 NOTE — Progress Notes (Signed)
EEG complete - results pending 

## 2023-03-20 NOTE — Procedures (Signed)
ELECTROENCEPHALOGRAM REPORT  Date of Study: 03/13/2023  Patient's Name: Ricky Werner MRN: [id number] Date of Birth: 1966-05-14  Referring Provider: Dr. Patrcia Dolly  Clinical History: This is a 57 year old man with a history of seizures with episodes of loss of time, body jerks, and memory loss. EEG for classification and to assess for subclinical seizures.  Medications: Keppra, Xanax, Norvasc, aspirin, clonidine,Lexapro, Robaxin, Lisinopril, Tramadol  Technical Summary: A multichannel digital 1-hour EEG recording measured by the international 10-20 system with electrodes applied with paste and impedances below 5000 ohms performed in our laboratory with EKG monitoring in an awake and asleep patient.  Hyperventilation was not performed. Photic stimulation was performed.  The digital EEG was referentially recorded, reformatted, and digitally filtered in a variety of bipolar and referential montages for optimal display.    Description: The patient is awake and asleep during the recording.  During maximal wakefulness, there is a symmetric, medium voltage 10 Hz posterior dominant rhythm that attenuates with eye opening.  The record is symmetric.  There is an excess amount of diffuse low voltage beta activity seen throughout the recording. During drowsiness and sleep, there is an increase in theta slowing of the background. Vertex waves and symmetric sleep spindles were seen.  Photic stimulation did not elicit any abnormalities.  There were no epileptiform discharges or electrographic seizures seen.    EKG lead was unremarkable.  Impression: This 1-hour awake and asleep EEG is normal except for excess amount of diffuse low voltage beta activity.  Clinical Correlation: Diffuse low voltage beta activity is commonly seen with sedating medications such as benzodiazepines.  In the absence of sedating medications, anxiety and hyperthyroidism may produce generalized beta activity.  The absence of  epileptiform discharges does not exclude a clinical diagnosis of epilepsy.  If further clinical questions remain, prolonged EEG may be helpful.  Clinical correlation is advised.   Patrcia Dolly, M.D.

## 2023-03-26 ENCOUNTER — Telehealth: Payer: Self-pay

## 2023-03-26 NOTE — Telephone Encounter (Signed)
MRI PA approved case number [id number]  Pt is scheduled for May 24th at 3:30pm at [facility]

## 2023-03-26 NOTE — Telephone Encounter (Signed)
Pt called informed that EEG is normal. Proceed with brain MRI as discussed

## 2023-03-26 NOTE — Telephone Encounter (Signed)
-----   Message from Van Clines, MD sent at 03/26/2023 12:35 PM EDT ----- Pls let him know EEG is normal. Proceed with brain MRI as discussed, thanks

## 2023-04-04 ENCOUNTER — Ambulatory Visit: Payer: Medicaid Other | Attending: Cardiovascular Disease | Admitting: Student

## 2023-04-04 ENCOUNTER — Encounter: Payer: Self-pay | Admitting: Student

## 2023-04-04 VITALS — BP 110/70 | HR 66 | Ht 66.0 in | Wt 166.8 lb

## 2023-04-04 DIAGNOSIS — Z87898 Personal history of other specified conditions: Secondary | ICD-10-CM | POA: Diagnosis not present

## 2023-04-04 DIAGNOSIS — Z79899 Other long term (current) drug therapy: Secondary | ICD-10-CM | POA: Diagnosis not present

## 2023-04-04 DIAGNOSIS — R0609 Other forms of dyspnea: Secondary | ICD-10-CM

## 2023-04-04 DIAGNOSIS — I10 Essential (primary) hypertension: Secondary | ICD-10-CM

## 2023-04-04 NOTE — Progress Notes (Signed)
Cardiology Office Note    Date:  04/04/2023  ID:  Ricky Werner, DOB [birth year], MRN [id number] Cardiologist: Charlton Haws, MD    History of Present Illness:    Ricky Werner is a 57 y.o. male with past medical history of HTN, anxiety, depression, panic attacks, seizure disorder and alcohol use who presents to the office today for overdue follow-up.  He was last examined by Dr. Eden Emms in 10/2021 and had recently experienced a seizure in the setting of alcohol use, marijuana use and methamphetamine use. He had previously reported chest pain but denied any recent symptoms. An echocardiogram was recommended to reassess his EF but it does not appear this was performed. He was informed to follow-up with Cardiology as needed but was referred back by his PCP due to dyspnea.  In talking with the patient today, he reports still having dyspnea on exertion which has been stable for many years. Reports he previously did not have his echocardiogram due to insurance issues at that time but now has insurance in place and wishes to pursue further workup. He does continue to smoke but says he has reduced his use from over 1.5 ppd down to less than 0.5 ppd. No longer consumes alcohol. He denies any recent chest pain or palpitations. No specific orthopnea, PND or pitting edema. He does use a cane for ambulation.  Studies Reviewed:   EKG: EKG is ordered today and demonstrates normal sinus rhythm, heart rate 65 with LVH and nonspecific ST segment abnormalities which is overall most consistent with repolarization.  CT Abdomen: 09/2018 IMPRESSION: 1. No evidence of acute abnormality. 2. Cholelithiasis without CT evidence of acute cholecystitis. If there is strong clinical suspicion for acute cholecystitis, consider ultrasound or nuclear medicine study. 3.  Aortic Atherosclerosis (ICD10-I70.0).   Physical Exam:   VS:  BP 110/70   Pulse 66   Ht 5\' 6"  (1.676 m)   Wt 166 lb 12.8 oz (75.7 kg)   SpO2 97%    BMI 26.92 kg/m    Wt Readings from Last 3 Encounters:  04/04/23 166 lb 12.8 oz (75.7 kg)  03/12/23 170 lb (77.1 kg)  11/22/21 170 lb 4 oz (77.2 kg)     GEN: Well nourished, well developed male appearing in no acute distress NECK: No JVD; No carotid bruits CARDIAC: RRR, no murmurs, rubs, gallops RESPIRATORY:  Clear to auscultation without rales, wheezing or rhonchi  ABDOMEN: Appears non-distended. No obvious abdominal masses. EXTREMITIES: No clubbing or cyanosis. No pitting edema.  Distal pedal pulses are 2+ bilaterally.   Assessment and Plan:   1. Dyspnea on Exertion - We reviewed that this could be of multiple etiologies but will initially start his workup with lab work including a CBC, BMET and BNP given no recent labs by his PCP per his report. Will obtain an echocardiogram as previously recommended to assess for any structural abnormalities. If this is reassuring, could consider a Lexiscan Myoview or Coronary CT for ischemic evaluation but at this time he denies any recent chest discomfort. If his cardiac workup is reassuring, would focus on ruling out a pulmonary etiology given his significant history of tobacco use.  2. HTN - His blood pressure is well-controlled at 110/70 during today's visit. Continue current medical therapy with Amlodipine 10 mg daily, Clonidine 0.2 mg 3 times daily (has been on this for several years and overall tolerated well) and Lisinopril 20 mg twice daily.  3.  Palpitations - He denies any recent symptoms and is  in normal sinus rhythm by examination and EKG today. Could consider a monitor in the future if he has recurrent symptoms.   Signed, Ellsworth Lennox, PA-C

## 2023-04-04 NOTE — Patient Instructions (Signed)
Medication Instructions:  Your physician recommends that you continue on your current medications as directed. Please refer to the Current Medication list given to you today.  *If you need a refill on your cardiac medications before your next appointment, please call your pharmacy*   Lab Work: Your physician recommends that you return for lab work ( BNP, BMP, CBC)   If you have labs (blood work) drawn today and your tests are completely normal, you will receive your results only by: MyChart Message (if you have MyChart) OR A paper copy in the mail If you have any lab test that is abnormal or we need to change your treatment, we will call you to review the results.   Testing/Procedures: Your physician has requested that you have an echocardiogram. Echocardiography is a painless test that uses sound waves to create images of your heart. It provides your doctor with information about the size and shape of your heart and how well your heart's chambers and valves are working. This procedure takes approximately one hour. There are no restrictions for this procedure. Please do NOT wear cologne, perfume, aftershave, or lotions (deodorant is allowed). Please arrive 15 minutes prior to your appointment time.    Follow-Up: At [facility], you and your health needs are our priority.  As part of our continuing mission to provide you with exceptional heart care, we have created designated Provider Care Teams.  These Care Teams include your primary Cardiologist (physician) and Advanced Practice Providers (APPs -  Physician Assistants and Nurse Practitioners) who all work together to provide you with the care you need, when you need it.  We recommend signing up for the patient portal called "MyChart".  Sign up information is provided on this After Visit Summary.  MyChart is used to connect with patients for Virtual Visits (Telemedicine).  Patients are able to view lab/test results, encounter  notes, upcoming appointments, etc.  Non-urgent messages can be sent to your provider as well.   To learn more about what you can do with MyChart, go to [email or web address].    Your next appointment:   6 month(s)  Provider:   You may see Charlton Haws, MD or one of the following Advanced Practice Providers on your designated Care Team:   Randall An, PA-C  Jacolyn Reedy, PA-C     Other Instructions Thank you for choosing [facility]!

## 2023-04-05 ENCOUNTER — Ambulatory Visit (HOSPITAL_COMMUNITY)
Admission: RE | Admit: 2023-04-05 | Discharge: 2023-04-05 | Disposition: A | Discharge status: Home / Self Care | Payer: Medicaid Other | Source: Ambulatory Visit | Attending: Student | Admitting: Student

## 2023-04-05 DIAGNOSIS — R0609 Other forms of dyspnea: Secondary | ICD-10-CM | POA: Diagnosis present

## 2023-04-05 LAB — ECHOCARDIOGRAM COMPLETE
Area-P 1/2: 3.21 cm2
Calc EF: 53.7 %
S' Lateral: 2.3 cm
Single Plane A2C EF: 56.7 %
Single Plane A4C EF: 55.5 %

## 2023-04-05 NOTE — Progress Notes (Signed)
  Echocardiogram 2D Echocardiogram has been performed.  Janalyn Harder 04/05/2023, 3:41 PM

## 2023-04-08 ENCOUNTER — Telehealth: Payer: Self-pay | Admitting: Student

## 2023-04-08 NOTE — Telephone Encounter (Signed)
Pt returning call for echo results  

## 2023-04-08 NOTE — Telephone Encounter (Signed)
Ricky Lennox, PA-C 04/08/2023  5:14 AM EDT     Please let the patient know his echocardiogram shows normal pumping function of his heart with an ejection fraction of 55-60%. No wall motion abnormalities. He does have some thickness of the heart muscle and abnormal relaxation and good blood pressure control is essential. No significant valve abnormalities. Overall, a reassuring study. It does not appear he had labs on the day of his echocardiogram and would recommended obtaining those.    Results discussed with patient, did not get labs done, will come one day later this week, pcp copied

## 2023-04-19 ENCOUNTER — Ambulatory Visit (HOSPITAL_COMMUNITY)
Admission: RE | Admit: 2023-04-19 | Discharge: 2023-04-19 | Disposition: A | Discharge status: Home / Self Care | Payer: Medicaid Other | Source: Ambulatory Visit | Attending: Neurology | Admitting: Neurology

## 2023-04-19 DIAGNOSIS — R413 Other amnesia: Secondary | ICD-10-CM | POA: Diagnosis present

## 2023-04-19 DIAGNOSIS — G43009 Migraine without aura, not intractable, without status migrainosus: Secondary | ICD-10-CM | POA: Diagnosis present

## 2023-04-19 DIAGNOSIS — G40009 Localization-related (focal) (partial) idiopathic epilepsy and epileptic syndromes with seizures of localized onset, not intractable, without status epilepticus: Secondary | ICD-10-CM | POA: Insufficient documentation

## 2023-04-19 MED ORDER — GADOBUTROL 1 MMOL/ML IV SOLN
7.5000 mL | Freq: Once | INTRAVENOUS | Status: AC | PRN
  Administered 2023-04-19: 7.5 mL via INTRAVENOUS

## 2023-04-24 ENCOUNTER — Telehealth: Payer: Self-pay

## 2023-04-24 NOTE — Telephone Encounter (Signed)
Pt called an informed that the brain MRI looked fine, no tumor, stroke, or bleed. It showed age-related changes

## 2023-04-24 NOTE — Telephone Encounter (Signed)
-----   Message from Van Clines, MD sent at 04/23/2023 12:59 PM EDT ----- Pls let him know brain MRI looked fine, no tumor, stroke, or bleed. It showed age-related changes. Thanks

## 2023-07-01 NOTE — Progress Notes (Deleted)
   Eustace Pen, male    DOB: Mar 30, 1966    MRN: [id number]   Brief patient profile:  57  yo*** *** referred to pulmonary clinic in [place]  07/02/2023 by *** for ***      History of Present Illness  07/02/2023  Pulmonary/ 1st office eval/ Sherene Sires / [facility]  No chief complaint on file.    Dyspnea:  *** Cough: *** Sleep: *** SABA use: *** 02: *** Lung cancer screen: ***   Outpatient Medications Prior to Visit  Medication Sig Dispense Refill   alprazolam (XANAX) 2 MG tablet Take 2 mg by mouth 3 (three) times daily.     amLODipine (NORVASC) 10 MG tablet Take 10 mg by mouth daily.  6   aspirin EC 81 MG tablet Take 81 mg by mouth daily.     cloNIDine (CATAPRES) 0.2 MG tablet Take 0.2 mg by mouth 3 (three) times daily.     escitalopram (LEXAPRO) 10 MG tablet Take 1 tablet by mouth daily.     Hyprom-Naphaz-Polysorb-Zn Sulf (CLEAR EYES COMPLETE OP) Place 1 drop into both eyes daily as needed.      levETIRAcetam (KEPPRA) 1000 MG tablet Take 1 tablet (1,000 mg total) by mouth 2 (two) times daily. 180 tablet 3   lisinopril (PRINIVIL,ZESTRIL) 20 MG tablet Take 20 mg by mouth 2 (two) times daily.  6   meloxicam (MOBIC) 7.5 MG tablet Take 7.5 mg by mouth 2 (two) times daily as needed.     methocarbamol (ROBAXIN) 500 MG tablet Take 1 tablet (500 mg total) by mouth every 8 (eight) hours as needed for muscle spasms. 30 tablet 0   Multiple Vitamin (MULTIVITAMIN WITH MINERALS) TABS tablet Take 1 tablet by mouth daily.     naproxen (NAPROSYN) 500 MG tablet Take 1 tablet (500 mg total) by mouth 2 (two) times daily. 30 tablet 0   traMADol (ULTRAM) 50 MG tablet Take 50 mg by mouth 4 (four) times daily as needed.     No facility-administered medications prior to visit.    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    Chronic back pain    Depression    Headache    History of dental  problems    Hypertension    Osteoarthritis    Panic attacks    Right tennis elbow    Sciatica    Seizures (HCC) 09/30/2020   Vertigo       Objective:     There were no vitals taken for this visit.         Assessment   No problem-specific Assessment & Plan notes found for this encounter.     Sandrea Hughs, MD 07/01/2023

## 2023-07-02 ENCOUNTER — Institutional Professional Consult (permissible substitution): Payer: Medicaid Other | Admitting: Internal Medicine

## 2023-07-24 NOTE — Progress Notes (Unsigned)
Ricky Werner, male    DOB: 02/13/1966    MRN: [id number]   Brief patient profile:  85 yowm  active smoker  referred to pulmonary clinic in [place]  07/25/2023 by Dr Sherwood Gambler  for progressive doe x 2021.    History of Present Illness  07/25/2023  Pulmonary/ 1st office eval/ Ricky Werner / [facility]  Chief Complaint  Patient presents with   Establish Care   Shortness of Breath  Dyspnea:  maybe one or two aisles and out of breath.  Cough: dry raspy with choking sensation  Sleep: on couch with a pillow due to vertigo but can't sleep due to choking spells  SABA use: never used  02: none  Lung cancer screen:  referred   No obvious day to day or daytime pattern/variability or assoc excess/ purulent sputum or mucus plugs or hemoptysis or cp or chest tightness, subjective wheeze or overt sinus or hb symptoms.    Also denies any obvious fluctuation of symptoms with weather or environmental changes or other aggravating or alleviating factors except as outlined above   No unusual exposure hx or h/o childhood pna/ asthma or knowledge of premature birth.  Current Allergies, Complete Past Medical History, Past Surgical History, Family History, and Social History were reviewed in [name].  ROS  The following are not active complaints unless bolded Hoarseness, sore throat, dysphagia, dental problems, itching, sneezing,  nasal congestion or discharge of excess mucus or purulent secretions, ear ache,   fever, chills, sweats, unintended wt loss or wt gain, classically pleuritic or exertional cp,  orthopnea pnd or arm/hand swelling  or leg swelling, presyncope, palpitations, abdominal pain, anorexia, nausea, vomiting, diarrhea  or change in bowel habits or change in bladder habits, change in stools or change in urine, dysuria, hematuria,  rash, arthralgias, visual complaints, headache, numbness, weakness or ataxia or problems with walking or coordination,  change in mood  or  memory. tremor            Outpatient Medications Prior to Visit  Medication Sig Dispense Refill   alprazolam (XANAX) 2 MG tablet Take 2 mg by mouth 3 (three) times daily.     amLODipine (NORVASC) 10 MG tablet Take 10 mg by mouth daily.  6   aspirin EC 81 MG tablet Take 81 mg by mouth daily.     cloNIDine (CATAPRES) 0.2 MG tablet Take 0.2 mg by mouth 3 (three) times daily.     escitalopram (LEXAPRO) 10 MG tablet Take 1 tablet by mouth daily.     Hyprom-Naphaz-Polysorb-Zn Sulf (CLEAR EYES COMPLETE OP) Place 1 drop into both eyes daily as needed.      levETIRAcetam (KEPPRA) 1000 MG tablet Take 1 tablet (1,000 mg total) by mouth 2 (two) times daily. 180 tablet 3   lisinopril (PRINIVIL,ZESTRIL) 20 MG tablet Take 20 mg by mouth 2 (two) times daily.  6   methocarbamol (ROBAXIN) 500 MG tablet Take 1 tablet (500 mg total) by mouth every 8 (eight) hours as needed for muscle spasms. 30 tablet 0   Multiple Vitamin (MULTIVITAMIN WITH MINERALS) TABS tablet Take 1 tablet by mouth daily.     naproxen (NAPROSYN) 500 MG tablet Take 1 tablet (500 mg total) by mouth 2 (two) times daily. 30 tablet 0   No facility-administered medications prior to visit.    Past Medical History:  Diagnosis Date   Alcohol abuse    Anxiety    BPH (benign prostatic hyperplasia)    Carpal  tunnel syndrome on right    Chronic back pain    Depression    Headache    History of dental problems    Hypertension    Osteoarthritis    Panic attacks    Right tennis elbow    Sciatica    Seizures (HCC) 09/30/2020   Vertigo       Objective:     BP 128/84   Pulse 85   Ht 5\' 6"  (1.676 m)   Wt 173 lb (78.5 kg)   SpO2 96%   BMI 27.92 kg/m   SpO2: 96 % RA   Amb wm raspy voice and harsh dry cough to point of near syncope    HEENT : Oropharynx  clear/ very poor upper dentition      Nasal turbinates nl    NECK :  without  apparent JVD/ palpable Nodes/TM    LUNGS: no acc muscle use,  Nl contour chest which is  clear to A and P bilaterally without cough on insp or exp maneuvers   CV:  RRR  no s3 or murmur or increase in P2, and no edema   ABD:  soft and nontender with nl inspiratory excursion in the supine position. No bruits or organomegaly appreciated   MS:  Nl gait/ ext warm without deformities Or obvious joint restrictions  calf tenderness, cyanosis or clubbing    SKIN: warm and dry without lesions    NEURO:  alert, approp, nl sensorium with  no motor or cerebellar deficits apparent. Resting tremor   MRI sinus 04/19/23  No mass or acute finding within the imaged orbits. No significant paranasal sinus disease.  No recent cxr's on file      Assessment   DOE (dyspnea on exertion) Active smoker - onset 2021 > progressed to 50 ft by time of 1st pulmonary ov 07/25/2023  - 07/25/2023   Walked on RA  x  3  lap(s) =  approx 450  ft  @ slow to mod  pace, stopped due to end of study with lowest 02 sats 98% but unsteady on feet  - 07/25/2023 try off acei    Symptoms are markedly disproportionate to objective findings and not clear to what extent this is actually a pulmonary  problem but pt does appear to have difficult to sort out respiratory symptoms of unknown origin for which  DDX  = almost all start with A and  include Adherence, Ace Inhibitors, Acid Reflux, Active Sinus Disease, Alpha 1 Antitripsin deficiency, Anxiety masquerading as Airways dz,  ABPA,  Allergy(esp in young), Aspiration (esp in elderly), Adverse effects of meds,  Active smoking or Vaping, A bunch of PE's/clot burden (a few small clots can't cause this syndrome unless there is already severe underlying pulm or vascular dz with poor reserve),  Anemia or thyroid disorder, plus two Bs  = Bronchiectasis and Beta blocker use..and one C= CHF    Adherence is always the initial "prime suspect" and is a multilayered concern that requires a "trust but verify" approach in every patient - starting with knowing how to use medications, especially  inhalers, correctly, keeping up with refills and understanding the fundamental difference between maintenance and prns vs those medications only taken for a very short course and then stopped and not refilled.   ACEi adverse effects at the  top of the usual list of suspects and the only way to rule it out is a trial off > see a/p    ? Allergy /asthma >  nothing to suggest   ? Anxiety/depression deconditioning  > usually at the bottom of this list of usual suspects but should be much higher on this pt's based on H and P and note already on psychotropics and may interfere with adherence and also interpretation of response or lack thereof to symptom management which can be quite subjective.   ? Active sinus dz > neg MRI 04/19/23 rules this out   ? Anemia, thryoid dz per PCP  Lab Results  Component Value Date   HGB 12.8 (L) 08/16/2020   HGB 15.7 11/24/2018   HGB 14.4 10/19/2018     ? CHF > per cards      Essential hypertension Off acei 07/25/2023 choking sensation/ unexplained doe   In the best review of chronic cough to date ( NEJM 2016 375 7829-5621) ,  ACEi are now felt to cause cough in up to  20% of pts which is a 4 fold increase from previous reports and does not include the variety of non-specific complaints we see in pulmonary clinic in pts on ACEi but previously attributed to another dx like  Copd/asthma and  include PNDS, throat and chest congestion, "bronchitis", unexplained dyspnea and noct "strangling" sensations, and hoarseness, but also  atypical /refractory GERD symptoms like dysphagia and "bad heartburn"   The only way I know  to prove this is not an "ACEi Case" is a trial off ACEi x a minimum of 6 weeks then regroup.   >>> try benicar 20 mg bid and regroup in 6 weeks  Cigarette smoker Counseled re importance of smoking cessation but did not meet time criteria for separate billing    Each maintenance medication was reviewed in detail including emphasizing most importantly  the difference between maintenance and prns and under what circumstances the prns are to be triggered using an action plan format where appropriate.  Total time for H and P, chart review, counseling,   directly observing portions of ambulatory 02 saturation study/ and generating customized AVS unique to this office visit / same day charting = 45 min new pt eval                   Sandrea Hughs, MD 07/25/2023

## 2023-07-25 ENCOUNTER — Ambulatory Visit: Payer: Medicaid Other | Admitting: Internal Medicine

## 2023-07-25 ENCOUNTER — Encounter: Payer: Self-pay | Admitting: Internal Medicine

## 2023-07-25 VITALS — BP 128/84 | HR 85 | Ht 66.0 in | Wt 173.0 lb

## 2023-07-25 DIAGNOSIS — F17210 Nicotine dependence, cigarettes, uncomplicated: Secondary | ICD-10-CM

## 2023-07-25 DIAGNOSIS — I10 Essential (primary) hypertension: Secondary | ICD-10-CM | POA: Diagnosis not present

## 2023-07-25 DIAGNOSIS — R0609 Other forms of dyspnea: Secondary | ICD-10-CM | POA: Diagnosis not present

## 2023-07-25 MED ORDER — OLMESARTAN MEDOXOMIL 20 MG PO TABS: 20.0000 mg | ORAL_TABLET | Freq: Every day | ORAL | 11 refills | Status: DC

## 2023-07-25 NOTE — Assessment & Plan Note (Signed)
Counseled re importance of smoking cessation but did not meet time criteria for separate billing    Each maintenance medication was reviewed in detail including emphasizing most importantly the difference between maintenance and prns and under what circumstances the prns are to be triggered using an action plan format where appropriate.  Total time for H and P, chart review, counseling,  directly observing portions of ambulatory 02 saturation study/ and generating customized AVS unique to this office visit / same day charting > 45 min new pt eval

## 2023-07-25 NOTE — Assessment & Plan Note (Signed)
Off acei 07/25/2023 choking sensation/ unexplained doe   In the best review of chronic cough to date ( NEJM 2016 375 9147-8295) ,  ACEi are now felt to cause cough in up to  20% of pts which is a 4 fold increase from previous reports and does not include the variety of non-specific complaints we see in pulmonary clinic in pts on ACEi but previously attributed to another dx like  Copd/asthma and  include PNDS, throat and chest congestion, "bronchitis", unexplained dyspnea and noct "strangling" sensations, and hoarseness, but also  atypical /refractory GERD symptoms like dysphagia and "bad heartburn"   The only way I know  to prove this is not an "ACEi Case" is a trial off ACEi x a minimum of 6 weeks then regroup.   >>> try benicar 20 mg bid and regroup in 6 weeks

## 2023-07-25 NOTE — Assessment & Plan Note (Addendum)
Active smoker - onset 2021 > progressed to 50 ft by time of 1st pulmonary ov 07/25/2023  - 07/25/2023   Walked on RA  x  3  lap(s) =  approx 450  ft  @ slow to mod  pace, stopped due to end of study with lowest 02 sats 98% but unsteady on feet  - 07/25/2023 try off acei    Symptoms are markedly disproportionate to objective findings and not clear to what extent this is actually a pulmonary  problem but pt does appear to have difficult to sort out respiratory symptoms of unknown origin for which  DDX  = almost all start with A and  include Adherence, Ace Inhibitors, Acid Reflux, Active Sinus Disease, Alpha 1 Antitripsin deficiency, Anxiety masquerading as Airways dz,  ABPA,  Allergy(esp in young), Aspiration (esp in elderly), Adverse effects of meds,  Active smoking or Vaping, A bunch of PE's/clot burden (a few small clots can't cause this syndrome unless there is already severe underlying pulm or vascular dz with poor reserve),  Anemia or thyroid disorder, plus two Bs  = Bronchiectasis and Beta blocker use..and one C= CHF    Adherence is always the initial "prime suspect" and is a multilayered concern that requires a "trust but verify" approach in every patient - starting with knowing how to use medications, especially inhalers, correctly, keeping up with refills and understanding the fundamental difference between maintenance and prns vs those medications only taken for a very short course and then stopped and not refilled.   ACEi adverse effects at the  top of the usual list of suspects and the only way to rule it out is a trial off > see a/p    ? Allergy /asthma > nothing to suggest   ? Anxiety/depression deconditioning  > usually at the bottom of this list of usual suspects but should be much higher on this pt's based on H and P and note already on psychotropics and may interfere with adherence and also interpretation of response or lack thereof to symptom management which can be quite subjective.    ? Active sinus dz > neg MRI 04/19/23 rules this out   ? Anemia, thryoid dz per PCP  Lab Results  Component Value Date   HGB 12.8 (L) 08/16/2020   HGB 15.7 11/24/2018   HGB 14.4 10/19/2018     ? CHF > per cards

## 2023-07-25 NOTE — Patient Instructions (Signed)
Stop  linsinopril and start olmesartan 20 mg  twice daily - take only once daily if too strong   My office will be contacting you by phone for referral to Lung cancer screening   - if you don't hear back from my office within one week please call us back or notify us thru MyChart and we'll address it right away.   Please schedule a follow up office visit in 6 weeks, call sooner if needed

## 2023-09-05 ENCOUNTER — Ambulatory Visit: Payer: Medicaid Other | Admitting: Internal Medicine

## 2023-09-05 NOTE — Progress Notes (Deleted)
Ricky Werner, male    DOB: 1966-03-31    MRN: [id number]   Brief patient profile:  14 yowm  active smoker  referred to pulmonary clinic in [place]  07/25/2023 by Dr Sherwood Gambler  for progressive doe x 2021.    History of Present Illness  07/25/2023  Pulmonary/ 1st Werner eval/ Ricky Werner / [place] [facility]  Chief Complaint  Patient presents with   Establish Care   Shortness of Breath  Dyspnea:  maybe one or two aisles and out of breath.  Cough: dry raspy with choking sensation  Sleep: on couch with a pillow due to vertigo but can't sleep due to choking spells  SABA use: never used  02: none  Lung cancer screen:  referred  Rec Stop  linsinopril and start olmesartan 20 mg  twice daily - take only once daily if too strong  My Werner will be contacting you by phone for referral to Lung cancer screening   Please schedule a follow up Werner visit in 6 weeks, call sooner if needed    09/05/2023  f/u ov/Ricky Werner/Ricky Werner re: *** maint on ***  No chief complaint on file.   Dyspnea:  *** Cough: *** Sleeping: ***   resp cc  SABA use: *** 02: ***  Lung cancer screening: ***   No obvious day to day or daytime variability or assoc excess/ purulent sputum or mucus plugs or hemoptysis or cp or chest tightness, subjective wheeze or overt sinus or hb symptoms.    Also denies any obvious fluctuation of symptoms with weather or environmental changes or other aggravating or alleviating factors except as outlined above   No unusual exposure hx or h/o childhood pna/ asthma or knowledge of premature birth.  Current Allergies, Complete Past Medical History, Past Surgical History, Family History, and Social History were reviewed in Ricky Werner record.  ROS  The following are not active complaints unless bolded Hoarseness, sore throat, dysphagia, dental problems, itching, sneezing,  nasal congestion or discharge of excess mucus or purulent secretions, ear ache,   fever,  chills, sweats, unintended wt loss or wt gain, classically pleuritic or exertional cp,  orthopnea pnd or arm/hand swelling  or leg swelling, presyncope, palpitations, abdominal pain, anorexia, nausea, vomiting, diarrhea  or change in bowel habits or change in bladder habits, change in stools or change in urine, dysuria, hematuria,  rash, arthralgias, visual complaints, headache, numbness, weakness or ataxia or problems with walking or coordination,  change in mood or  memory.        No outpatient medications have been marked as taking for the 09/05/23 encounter (Appointment) with Nyoka Cowden, MD.          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 No significant paranasal sinus disease.  No recent cxr's on file      Assessment

## 2023-09-16 ENCOUNTER — Other Ambulatory Visit: Payer: Self-pay | Admitting: Emergency Medicine

## 2023-09-16 ENCOUNTER — Telehealth: Payer: Self-pay | Admitting: Internal Medicine

## 2023-09-16 DIAGNOSIS — Z87891 Personal history of nicotine dependence: Secondary | ICD-10-CM

## 2023-09-16 DIAGNOSIS — F17210 Nicotine dependence, cigarettes, uncomplicated: Secondary | ICD-10-CM

## 2023-09-16 DIAGNOSIS — Z122 Encounter for screening for malignant neoplasm of respiratory organs: Secondary | ICD-10-CM

## 2023-09-16 NOTE — Telephone Encounter (Signed)
Patient returned call. Spoke with pt and screened patient for program. Patient met LCS criteria. SDMV scheduled for 09/18/23 and LDCT scheduled for 09/24/2023. Patient is aware of both appts time/date/location.

## 2023-09-16 NOTE — Telephone Encounter (Signed)
Good morning. Dr. Sherene Sires has a patient Ricky Werner dob [birth year] MRN [id number] that has been referred to [facility] for lung cancer screening.  He is aware there is a back long, but is hoping we can schedule him sooner rather than later--he has a disability hearing coming up in a few weeks and would like to have the results of the meeting with [facility] at that time.  Can you help?  Patient's phone number [phone]

## 2023-09-16 NOTE — Telephone Encounter (Signed)
Attempted to contact pt. Left message for pt to call back regarding [facility]CS referral.

## 2023-09-18 ENCOUNTER — Ambulatory Visit (INDEPENDENT_AMBULATORY_CARE_PROVIDER_SITE_OTHER): Payer: Medicaid Other | Admitting: Adult Health

## 2023-09-18 ENCOUNTER — Encounter: Payer: Self-pay | Admitting: Adult Health

## 2023-09-18 DIAGNOSIS — F17210 Nicotine dependence, cigarettes, uncomplicated: Secondary | ICD-10-CM | POA: Diagnosis not present

## 2023-09-18 NOTE — Progress Notes (Signed)
  Virtual Visit via Telephone Note  I connected with Ricky Werner , 09/18/23 11:41 AM by a telemedicine application and verified that I am speaking with the correct person using two identifiers.  Location: Patient: home Provider: home   I discussed the limitations of evaluation and management by telemedicine and the availability of in person appointments. The patient expressed understanding and agreed to proceed.   Shared Decision Making Visit Lung Cancer Screening Program [phone])   Eligibility: 57 y.o. Pack Years Smoking History Calculation = 45 pack years (# packs/per year x # years smoked) Recent History of coughing up blood  no Unexplained weight loss? no ( >Than 15 pounds within the last 6 months ) Prior History Lung / other cancer no (Diagnosis within the last 5 years already requiring surveillance chest CT Scans). Smoking Status Current Smoker  Visit Components: Discussion included one or more decision making aids. YES Discussion included risk/benefits of screening. YES Discussion included potential follow up diagnostic testing for abnormal scans. YES Discussion included meaning and risk of over diagnosis. YES Discussion included meaning and risk of False Positives. YES Discussion included meaning of total radiation exposure. YES  Counseling Included: Importance of adherence to annual lung cancer LDCT screening. YES Impact of comorbidities on ability to participate in the program. YES Ability and willingness to under diagnostic treatment. YES  Smoking Cessation Counseling: Current Smokers:  Discussed importance of smoking cessation. yes Information about tobacco cessation classes and interventions provided to patient. yes Patient provided with "ticket" for LDCT Scan. yes Symptomatic Patient. no Diagnosis Code: Tobacco Use Z72.0 Asymptomatic Patient yes  Counseling (Intermediate counseling: > three minutes counseling) [id number] Patient provided with "ticket" for  LDCT Scan. yes Written Order for Lung Cancer Screening with LDCT placed in Epic. Yes (CT Chest Lung Cancer Screening Low Dose W/O CM) [id number]  Z12.2-Screening of respiratory organs Z87.891-Personal history of nicotine dependence   [name] 09/18/23

## 2023-09-18 NOTE — Patient Instructions (Signed)

## 2023-09-20 ENCOUNTER — Ambulatory Visit: Payer: Medicaid Other | Admitting: Neurology

## 2023-09-24 ENCOUNTER — Encounter (HOSPITAL_COMMUNITY): Payer: Self-pay

## 2023-09-24 ENCOUNTER — Ambulatory Visit (HOSPITAL_COMMUNITY): Admission: RE | Admit: 2023-09-24 | Payer: Medicaid Other | Source: Ambulatory Visit

## 2023-09-30 DIAGNOSIS — Z0271 Encounter for disability determination: Secondary | ICD-10-CM

## 2023-10-03 ENCOUNTER — Ambulatory Visit (HOSPITAL_COMMUNITY): Admission: RE | Admit: 2023-10-03 | Payer: Medicaid Other | Source: Ambulatory Visit

## 2023-10-03 LAB — EXTERNAL GENERIC LAB PROCEDURE: COLOGUARD: POSITIVE — AB

## 2023-10-03 LAB — COLOGUARD: COLOGUARD: POSITIVE — AB

## 2023-10-06 NOTE — Progress Notes (Deleted)
AANAV BREDBENNER, male    DOB: March 24, 1966    MRN: [id number]   Brief patient profile:  37 yowm  active smoker  referred to pulmonary clinic in [place]  07/25/2023 by Dr Sherwood Gambler  for progressive doe x 2021.    History of Present Illness  07/25/2023  Pulmonary/ 1st office eval/ Srah Ake / [place] [facility]  Chief Complaint  Patient presents with   Establish Care   Shortness of Breath  Dyspnea:  maybe one or two aisles and out of breath.  Cough: dry raspy with choking sensation  Sleep: on couch with a pillow due to vertigo but can't sleep due to choking spells  SABA use: never used  02: none  Lung cancer screen:  referred Rec  Stop  linsinopril and start olmesartan 20 mg  twice daily - take only once daily if too strong   My office will be contacting you by phone for referral to Lung cancer screening   > ordered but not done as of 10/07/2023   Please schedule a follow up office visit in 6 weeks, call sooner if needed    10/07/2023  f/u ov/[place] office/Laelynn Blizzard re: *** maint on *** needs ldsct ***  No chief complaint on file.   Dyspnea:  *** Cough: *** Sleeping: ***   resp cc  SABA use: *** 02: ***  Lung cancer screening: ***   No obvious day to day or daytime variability or assoc excess/ purulent sputum or mucus plugs or hemoptysis or cp or chest tightness, subjective wheeze or overt sinus or hb symptoms.    Also denies any obvious fluctuation of symptoms with weather or environmental changes or other aggravating or alleviating factors except as outlined above   No unusual exposure hx or h/o childhood pna/ asthma or knowledge of premature birth.  Current Allergies, Complete Past Medical History, Past Surgical History, Family History, and Social History were reviewed in [name].  ROS  The following are not active complaints unless bolded Hoarseness, sore throat, dysphagia, dental problems, itching, sneezing,  nasal congestion or discharge of  excess mucus or purulent secretions, ear ache,   fever, chills, sweats, unintended wt loss or wt gain, classically pleuritic or exertional cp,  orthopnea pnd or arm/hand swelling  or leg swelling, presyncope, palpitations, abdominal pain, anorexia, nausea, vomiting, diarrhea  or change in bowel habits or change in bladder habits, change in stools or change in urine, dysuria, hematuria,  rash, arthralgias, visual complaints, headache, numbness, weakness or ataxia or problems with walking or coordination,  change in mood or  memory.        No outpatient medications have been marked as taking for the 10/07/23 encounter (Appointment) with Nyoka Cowden, MD.            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    Chronic back pain    Depression    Headache    History of dental problems    Hypertension    Osteoarthritis    Panic attacks    Right tennis elbow    Sciatica    Seizures (HCC) 09/30/2020   Vertigo       Objective:     Wt Readings from Last 3 Encounters:  07/25/23 173 lb (78.5 kg)  04/04/23 166 lb 12.8 oz (75.7 kg)  03/12/23 170 lb (77.1 kg)  Vital signs reviewed  10/07/2023  - Note at rest 02 sats  ***% on ***   General appearance:    ***  very poor upper dentition***           MRI sinus 04/19/23  No mass or acute finding within the imaged orbits. No significant paranasal sinus disease.  No recent cxr's on file      Assessment

## 2023-10-07 ENCOUNTER — Encounter: Payer: Self-pay | Admitting: Internal Medicine

## 2023-10-07 ENCOUNTER — Ambulatory Visit: Payer: Medicaid Other | Admitting: Internal Medicine

## 2023-10-14 ENCOUNTER — Encounter: Payer: Self-pay | Admitting: Neurology

## 2023-10-14 NOTE — Progress Notes (Deleted)
GI Office Note    Referring Provider: Elfredia Nevins, MD Primary Care Physician:  Elfredia Nevins, MD  Primary Gastroenterologist: *** Chief Complaint   No chief complaint on file.   History of Present Illness   Ricky Werner is a 57 y.o. male presenting today at the request of Elfredia Nevins, MD for ***positive Cologuard.  Cologuard no reviewed positive on sample provided 09/25/2023  Follows with [facility] pulmonology for dyspnea on exertion.  Advise trial off ACE inhibitor.  Advised to try Benicar instead of ACE.  Noted no symptoms/signs to suggest allergy or asthma because of dyspnea on exertion.  He did question anemia given hemoglobin 12.8 in September 2021.  Today:    Wt Readings from Last 3 Encounters:  07/25/23 173 lb (78.5 kg)  04/04/23 166 lb 12.8 oz (75.7 kg)  03/12/23 170 lb (77.1 kg)    Current Outpatient Medications  Medication Sig Dispense Refill   alprazolam (XANAX) 2 MG tablet Take 2 mg by mouth 3 (three) times daily.     amLODipine (NORVASC) 10 MG tablet Take 10 mg by mouth daily.  6   aspirin EC 81 MG tablet Take 81 mg by mouth daily.     cloNIDine (CATAPRES) 0.2 MG tablet Take 0.2 mg by mouth 3 (three) times daily.     escitalopram (LEXAPRO) 10 MG tablet Take 1 tablet by mouth daily.     Hyprom-Naphaz-Polysorb-Zn Sulf (CLEAR EYES COMPLETE OP) Place 1 drop into both eyes daily as needed.      levETIRAcetam (KEPPRA) 1000 MG tablet Take 1 tablet (1,000 mg total) by mouth 2 (two) times daily. 180 tablet 3   methocarbamol (ROBAXIN) 500 MG tablet Take 1 tablet (500 mg total) by mouth every 8 (eight) hours as needed for muscle spasms. 30 tablet 0   Multiple Vitamin (MULTIVITAMIN WITH MINERALS) TABS tablet Take 1 tablet by mouth daily.     naproxen (NAPROSYN) 500 MG tablet Take 1 tablet (500 mg total) by mouth 2 (two) times daily. 30 tablet 0   olmesartan (BENICAR) 20 MG tablet Take 1 tablet (20 mg total) by mouth daily. One twice daily 60 tablet 11   No  current facility-administered medications for this visit.    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    Chronic back pain    Depression    Headache    History of dental problems    Hypertension    Osteoarthritis    Panic attacks    Right tennis elbow    Sciatica    Seizures (HCC) 09/30/2020   Vertigo     Past Surgical History:  Procedure Laterality Date   KIDNEY STONE SURGERY      Family History  Problem Relation Age of Onset   Hypertension Mother    Hyperlipidemia Mother    Diabetes Mother    Breast cancer Mother    Hypertension Brother    Stroke Father    Hypertension Father     Allergies as of 10/16/2023   (No Known Allergies)    Social History   Socioeconomic History   Marital status: Divorced    Spouse name: Not on file   Number of children: Not on file   Years of education: Not on file   Highest education level: Not on file  Occupational History   Not on file  Tobacco Use  Smoking status: Every Day    Current packs/day: 1.00    Average packs/day: 1 pack/day for 39.0 years (39.0 ttl pk-yrs)    Types: Cigarettes   Smokeless tobacco: Never  Vaping Use   Vaping status: Never Used  Substance and Sexual Activity   Alcohol use: Not Currently    Alcohol/week: 6.0 standard drinks of alcohol    Types: 6 Cans of beer per week    Comment: pt describes depends on what week may have 5th of liqour within a week and go a week without   Drug use: Not Currently    Types: Marijuana    Comment: occ   Sexual activity: Not on file  Other Topics Concern   Not on file  Social History Narrative   Lives with youngest son but he works 3rd shift   2018 stopped drinking cold [place] went into DT's started having seizures    Is not working at this time    Lives in 1 story no steps    Social Determinants of Corporate investment banker Strain: Not on file  Food  Insecurity: Not on file  Transportation Needs: Not on file  Physical Activity: Not on file  Stress: Not on file  Social Connections: Not on file  Intimate Partner Violence: Not on file     Review of Systems   Gen: Denies any fever, chills, fatigue, weight loss, lack of appetite.  CV: Denies chest pain, heart palpitations, peripheral edema, syncope.  Resp: Denies shortness of breath at rest or with exertion. Denies wheezing or cough.  GI: see HPI GU : Denies urinary burning, urinary frequency, urinary hesitancy MS: Denies joint pain, muscle weakness, cramps, or limitation of movement.  Derm: Denies rash, itching, dry skin Psych: Denies depression, anxiety, memory loss, and confusion Heme: Denies bruising, bleeding, and enlarged lymph nodes.  Physical Exam   There were no vitals taken for this visit.  General:   Alert and oriented. Pleasant and cooperative. Well-nourished and well-developed.  Head:  Normocephalic and atraumatic. Eyes:  Without icterus, sclera clear and conjunctiva pink.  Ears:  Normal auditory acuity. Mouth:  No deformity or lesions, oral mucosa pink.  Lungs:  Clear to auscultation bilaterally. No wheezes, rales, or rhonchi. No distress.  Heart:  S1, S2 present without murmurs appreciated.  Abdomen:  +BS, soft, non-tender and non-distended. No HSM noted. No guarding or rebound. No masses appreciated.  Rectal:  Deferred  Msk:  Symmetrical without gross deformities. Normal posture. Extremities:  Without edema. Neurologic:  Alert and  oriented x4;  grossly normal neurologically. Skin:  Intact without significant lesions or rashes. Psych:  Alert and cooperative. Normal mood and affect.  Assessment   Ricky Werner is a 57 y.o. male with a history of *** presenting today with      PLAN   ***    Brooke Bonito, MSN, FNP-BC, AGACNP-BC [facility]

## 2023-10-16 ENCOUNTER — Ambulatory Visit: Payer: Medicaid Other | Admitting: Gastroenterology

## 2023-10-17 ENCOUNTER — Encounter: Payer: Self-pay | Admitting: Gastroenterology

## 2023-10-22 ENCOUNTER — Ambulatory Visit (HOSPITAL_COMMUNITY): Admission: RE | Admit: 2023-10-22 | Payer: Medicaid Other | Source: Ambulatory Visit

## 2023-11-04 ENCOUNTER — Encounter: Payer: Self-pay | Admitting: Gastroenterology

## 2023-11-04 ENCOUNTER — Ambulatory Visit: Payer: Medicaid Other | Admitting: Gastroenterology

## 2023-11-07 ENCOUNTER — Ambulatory Visit (HOSPITAL_COMMUNITY): Admission: RE | Admit: 2023-11-07 | Payer: Medicaid Other | Source: Ambulatory Visit

## 2023-11-26 ENCOUNTER — Ambulatory Visit: Payer: Medicaid Other | Admitting: Internal Medicine

## 2023-11-28 NOTE — Progress Notes (Signed)
 Cardiology Office Note    Date:  12/10/2023  ID:  Ricky Werner, DOB Nov 14, 1966, MRN [id number] Cardiologist: Maude Emmer, MD    History of Present Illness:    Ricky Werner is a 58 y.o. male with past medical history of HTN, anxiety, depression, panic attacks, seizure disorder and alcohol  use who presents to the office today for follow-up.  I last saw him in 10/2021 and had recently experienced a seizure in the setting of alcohol  use, marijuana use and methamphetamine use. He had previously reported chest pain but denied any recent symptoms. An echocardiogram was recommended to reassess his EF but it does not appear this was performed. He was informed to follow-up with Cardiology as needed but was referred back by his PCP and seen by PA 04/04/23 for dyspnea   He does continue to smoke but says he has reduced his use from over 1.5 ppd down to less than 0.5 ppd. No longer consumes alcohol . He denies any recent chest pain or palpitations. No specific orthopnea, PND or pitting edema. He does use a cane for ambulation.  Seen by pulmonary 09/18/23 They ordered CT Suspect component of anxiety as he is on xanax  and lexapro . Does not appear to have any inhalers  Echo done 04/05/23 normal EF 55-60% Normal RV no significant valve dx Lung Cancer CT 11/29/23 negative for cancer   He continues to be deconditioned. He is a mouth breather and congested with likely deviated septum Would benefit from ENT visit.   Discussed need to r/o CAD Given his primary symptom of dyspnea feel cardiopulmonary stress test would be best      Studies Reviewed:   EKG: EKG is ordered today and demonstrates normal sinus rhythm, heart rate 65 with LVH and nonspecific ST segment abnormalities which is overall most consistent with repolarization.  CT Abdomen: 09/2018 IMPRESSION: 1. No evidence of acute abnormality. 2. Cholelithiasis without CT evidence of acute cholecystitis. If there is strong clinical suspicion for  acute cholecystitis, consider ultrasound or nuclear medicine study. 3.  Aortic Atherosclerosis (ICD10-I70.0).   Physical Exam:   VS:  BP 120/80   Pulse 98   Ht 5' 6 (1.676 m)   Wt 179 lb 12.8 oz (81.6 kg)   SpO2 96%   BMI 29.02 kg/m    Wt Readings from Last 3 Encounters:  12/10/23 179 lb 12.8 oz (81.6 kg)  07/25/23 173 lb (78.5 kg)  04/04/23 166 lb 12.8 oz (75.7 kg)     GEN: Well nourished, well developed male appearing in no acute distress NECK: No JVD; No carotid bruits CARDIAC: RRR, no murmurs, rubs, gallops RESPIRATORY:  Clear to auscultation without rales, wheezing or rhonchi  ABDOMEN: Appears non-distended. No obvious abdominal masses. EXTREMITIES: No clubbing or cyanosis. No pitting edema.  Distal pedal pulses are 2+ bilaterally.   Assessment and Plan:   1. Dyspnea on Exertion -  does not appear to be cardiac.  -  TTE with normal RV/LV function  - Assess functional capacity with cardiopulmonary stress test - F/U pulmonary have them/primary refer to ENT   2. HTN - His blood pressure is well-controlled at 120/80 mmHg during today's visit. Continue current medical therapy with Amlodipine  10 mg daily, Clonidine  0.2 mg 3 times daily (has been on this for several years and overall tolerated well) Lisinopril  changed to Benicar  by pulmonary to avoid any issues with ACE and cough/lungs  3.  Palpitations - He denies any recent symptoms and is in normal sinus rhythm  by examination today and ECG 04/04/23   Cardiopulmonary stress test  F/U in a year    Signed, Maude Emmer, MD

## 2023-11-29 ENCOUNTER — Ambulatory Visit (HOSPITAL_COMMUNITY)
Admission: RE | Admit: 2023-11-29 | Discharge: 2023-11-29 | Disposition: A | Discharge status: Home / Self Care | Payer: Medicaid Other | Source: Ambulatory Visit | Attending: Acute Care | Admitting: Acute Care

## 2023-11-29 DIAGNOSIS — Z87891 Personal history of nicotine dependence: Secondary | ICD-10-CM | POA: Diagnosis present

## 2023-11-29 DIAGNOSIS — Z122 Encounter for screening for malignant neoplasm of respiratory organs: Secondary | ICD-10-CM | POA: Insufficient documentation

## 2023-11-29 DIAGNOSIS — F17210 Nicotine dependence, cigarettes, uncomplicated: Secondary | ICD-10-CM | POA: Insufficient documentation

## 2023-12-09 ENCOUNTER — Ambulatory Visit: Payer: Medicaid Other | Admitting: Gastroenterology

## 2023-12-10 ENCOUNTER — Other Ambulatory Visit: Payer: Self-pay

## 2023-12-10 ENCOUNTER — Ambulatory Visit: Payer: Medicaid Other | Attending: Cardiovascular Disease | Admitting: Cardiovascular Disease

## 2023-12-10 ENCOUNTER — Encounter: Payer: Self-pay | Admitting: Cardiovascular Disease

## 2023-12-10 VITALS — BP 120/80 | HR 98 | Ht 66.0 in | Wt 179.8 lb

## 2023-12-10 DIAGNOSIS — F17210 Nicotine dependence, cigarettes, uncomplicated: Secondary | ICD-10-CM

## 2023-12-10 DIAGNOSIS — I10 Essential (primary) hypertension: Secondary | ICD-10-CM

## 2023-12-10 DIAGNOSIS — R0609 Other forms of dyspnea: Secondary | ICD-10-CM

## 2023-12-10 DIAGNOSIS — Z87891 Personal history of nicotine dependence: Secondary | ICD-10-CM

## 2023-12-10 DIAGNOSIS — Z122 Encounter for screening for malignant neoplasm of respiratory organs: Secondary | ICD-10-CM

## 2023-12-10 DIAGNOSIS — Z87898 Personal history of other specified conditions: Secondary | ICD-10-CM

## 2023-12-10 NOTE — Patient Instructions (Signed)
 Medication Instructions:  Your physician recommends that you continue on your current medications as directed. Please refer to the Current Medication list given to you today.  *If you need a refill on your cardiac medications before your next appointment, please call your pharmacy*   Lab Work: NONE   If you have labs (blood work) drawn today and your tests are completely normal, you will receive your results only by: MyChart Message (if you have MyChart) OR A paper copy in the mail If you have any lab test that is abnormal or we need to change your treatment, we will call you to review the results.   Testing/Procedures: Your physician has recommended that you have a cardiopulmonary stress test (CPX). CPX testing is a non-invasive measurement of heart and lung function. It replaces a traditional treadmill stress test. This type of test provides a tremendous amount of information that relates not only to your present condition but also for future outcomes. This test combines measurements of you ventilation, respiratory gas exchange in the lungs, electrocardiogram (EKG), blood pressure and physical response before, during, and following an exercise protocol.    Follow-Up: At [facility], you and your health needs are our priority.  As part of our continuing mission to provide you with exceptional heart care, we have created designated Provider Care Teams.  These Care Teams include your primary Cardiologist (physician) and Advanced Practice Providers (APPs -  Physician Assistants and Nurse Practitioners) who all work together to provide you with the care you need, when you need it.  We recommend signing up for the patient portal called MyChart.  Sign up information is provided on this After Visit Summary.  MyChart is used to connect with patients for Virtual Visits (Telemedicine).  Patients are able to view lab/test results, encounter notes, upcoming appointments, etc.  Non-urgent  messages can be sent to your provider as well.   To learn more about what you can do with MyChart, go to [email or web address].    Your next appointment:   1 year(s)  Provider:   You may see Maude Emmer, MD or one of the following Advanced Practice Providers on your designated Care Team:   Laymon Qua, PA-C  [name] Pavy, PA-C     Other Instructions Thank you for choosing [facility]!

## 2023-12-12 NOTE — Progress Notes (Signed)
Ricky Werner, male    DOB: 04-20-1966    MRN: [id number]   Brief patient profile:  75 yowm  active smoker  referred to pulmonary clinic in [place]  07/25/2023 by Dr Sherwood Gambler  for progressive doe x 2021.    History of Present Illness  07/25/2023  Pulmonary/ 1st office eval/ Ramonda Galyon / [place] [facility]  Chief Complaint  Patient presents with   Establish Care   Shortness of Breath  Dyspnea:  maybe one or two aisles and out of breath.  Cough: dry raspy with choking sensation  Sleep: on couch with a pillow due to vertigo but can't sleep due to choking spells  SABA use: never used  02: none  Lung cancer screen:  referred  Rec Stop  lisinopril and start olmesartan 20 mg  twice daily - take only once daily if too strong  Please schedule a follow up office visit in 6 weeks, call sooner if needed     12/13/2023  f/u ov/[place] office/Nandini Bogdanski re: ? Ace case /emphysema on LDSCT  maint on no resp meds   Chief Complaint  Patient presents with   Follow-up    Breathing is unchanged. Denies new co's.   Dyspnea:  no better =  50 ft (not reproduced in clinic) but bothered also by severe vertigo and tinnitis  Cough: raspy assoc with hoarseness and nasal obst bilaterally x years p broke nose s ent eval yet  Sleeping: flat and no longer having choking spells or    resp cc  SABA use: none  02: none   Lung cancer screening: 11/29/23   No obvious day to day or daytime variability or assoc excess/ purulent sputum or mucus plugs or hemoptysis or cp or chest tightness, subjective wheeze or or hb symptoms.    Also denies any obvious fluctuation of symptoms with weather or environmental changes or other aggravating or alleviating factors except as outlined above   No unusual exposure hx or h/o childhood pna/ asthma or knowledge of premature birth.  Current Allergies, Complete Past Medical History, Past Surgical History, Family History, and Social History were reviewed in [employer] record.  ROS  The following are not active complaints unless bolded Hoarseness, sore throat / globus sensation, dysphagia, dental problems, itching, sneezing,  nasal congestion or discharge of excess mucus or purulent secretions, ear ache,   fever, chills, sweats, unintended wt loss or wt gain, classically pleuritic or exertional cp,  orthopnea pnd or arm/hand swelling  or leg swelling, presyncope, palpitations, abdominal pain, anorexia, nausea, vomiting, diarrhea  or change in bowel habits or change in bladder habits, change in stools or change in urine, dysuria, hematuria,  rash, arthralgias, visual complaints, headache, numbness, weakness or ataxia or problems with walking or coordination,  change in mood or  memory.        Current Meds  Medication Sig   alprazolam (XANAX) 2 MG tablet Take 2 mg by mouth 3 (three) times daily.   amLODipine (NORVASC) 10 MG tablet Take 10 mg by mouth daily.   aspirin EC 81 MG tablet Take 81 mg by mouth daily.   cloNIDine (CATAPRES) 0.2 MG tablet Take 0.2 mg by mouth 3 (three) times daily.   escitalopram (LEXAPRO) 10 MG tablet Take 1 tablet by mouth daily.   Hyprom-Naphaz-Polysorb-Zn Sulf (CLEAR EYES COMPLETE OP) Place 1 drop into both eyes daily as needed.    levETIRAcetam (KEPPRA) 1000 MG tablet Take 1 tablet (1,000 mg total) by mouth 2 (  two) times daily.   methocarbamol (ROBAXIN) 500 MG tablet Take 1 tablet (500 mg total) by mouth every 8 (eight) hours as needed for muscle spasms.   Multiple Vitamin (MULTIVITAMIN WITH MINERALS) TABS tablet Take 1 tablet by mouth daily.   naproxen (NAPROSYN) 500 MG tablet Take 1 tablet (500 mg total) by mouth 2 (two) times daily.   olmesartan (BENICAR) 20 MG tablet Take 1 tablet (20 mg total) by mouth daily. 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 HEENT : Oropharynx  clear      Nasal turbinates severe swelling / poor airway   NECK :  without  apparent JVD/ palpable Nodes/TM    LUNGS: no acc muscle use,  Nl contour chest which is clear to A and P bilaterally without cough on insp or exp maneuvers   CV:  RRR  no s3 or murmur or increase in P2, and no edema   ABD:  soft and nontender   MS:  Gait walks with walker   ext warm without deformities Or obvious joint restrictions  calf tenderness, cyanosis or clubbing    SKIN: warm and dry without lesions    NEURO:  alert, approp, nl sensorium with  no motor or cerebellar deficits apparent.    Lungs clear    MRI sinus 04/19/23  No mass or acute finding within the imaged orbits. No significant paranasal sinus disease.     I personally reviewed images and agree with radiology impression as follows:   Chest LDSCT    11/29/23  1. Lung-RADS 2, benign appearance or behavior. Lung-RADS 1, negative. Continue annual screening with low-dose chest CT without contrast in 12 months. 2. Coronary artery calcifications. 3. Cholelithiasis. 4. Aortic Atherosclerosis (ICD10-I70.0) and Emphysema (ICD10-J43.9). My review:  emphysema is mild    Assessment

## 2023-12-13 ENCOUNTER — Ambulatory Visit: Payer: Medicaid Other | Admitting: Internal Medicine

## 2023-12-13 ENCOUNTER — Encounter (INDEPENDENT_AMBULATORY_CARE_PROVIDER_SITE_OTHER): Payer: Self-pay | Admitting: Otolaryngology

## 2023-12-13 ENCOUNTER — Encounter: Payer: Self-pay | Admitting: Internal Medicine

## 2023-12-13 VITALS — BP 114/80 | HR 97 | Ht 66.0 in | Wt 177.2 lb

## 2023-12-13 DIAGNOSIS — J310 Chronic rhinitis: Secondary | ICD-10-CM | POA: Diagnosis not present

## 2023-12-13 DIAGNOSIS — I10 Essential (primary) hypertension: Secondary | ICD-10-CM

## 2023-12-13 DIAGNOSIS — F17210 Nicotine dependence, cigarettes, uncomplicated: Secondary | ICD-10-CM | POA: Diagnosis not present

## 2023-12-13 DIAGNOSIS — R0609 Other forms of dyspnea: Secondary | ICD-10-CM | POA: Diagnosis not present

## 2023-12-13 NOTE — Assessment & Plan Note (Signed)
Counseled re importance of smoking cessation but did not meet time criteria for separate billing   °

## 2023-12-13 NOTE — Assessment & Plan Note (Addendum)
 Referred to ENT 12/13/2023 >>>   Each maintenance medication was reviewed in detail including emphasizing most importantly the difference between maintenance and prns and under what circumstances the prns are to be triggered using an action plan format where appropriate.  Total time for H and P, chart review, counseling,  directly observing portions of ambulatory 02 saturation study/ and generating customized AVS unique to this office visit / same day charting = 36 min     Late add: - PFT's  01/22/23  FEV1 2.70 (85 % ) ratio 0.74  p 0 % improvement from saba p 0 prior to study with DLCO  19.7 (80%)   and FV curve abnormal insp> exp  suggesting upper airway obstruction with no expiratory issues so not suggestive of a lung problem > ent eval next step

## 2023-12-13 NOTE — Assessment & Plan Note (Addendum)
 Active smoker - onset 2021 > progressed to 50 ft by time of 1st pulmonary ov 07/25/2023  - 07/25/2023   Walked on RA  x  3  lap(s) =  approx 450  ft  @ slow to mod  pace, stopped due to end of study with lowest 02 sats 98% but unsteady on feet  - 07/25/2023 try off acei > no better 12/13/2023  - 12/13/2023   Walked on RA   x  3  lap(s) =  approx 450  ft  @ avg/ cane pace, stopped due to average pace with cane right hand/stopped x 2 to rest due to vertigo and leg fatigue/no SOB/lmr  with lowest 02 sats 98%    I agree with CPST next but apparently not able to schedule now but since he's a smoker with symptoms (albeit not reproducible) rec baseline PFTs and f/u here prn   Late add: - PFT's  01/22/23  FEV1 2.70 (85 % ) ratio 0.74  p 0 % improvement from saba p 0 prior to study with DLCO  19.7 (80%)   and FV curve abnormal insp> exp  suggesting upper airway obstruction with no expiratory issues so not suggestive of a lung problem > ent eval next step

## 2023-12-13 NOTE — Assessment & Plan Note (Signed)
Off acei 07/25/2023 choking sensation/ unexplained doe   Although even in retrospect it may not be clear the ACEi contributed to the pt's symptoms, the choking sensation  improved off them and adding them back at this point or in the future would risk confusion in interpretation of non-specific respiratory symptoms to which this patient is prone  ie  Better not to muddy the waters here.   >>>  follow up cards

## 2023-12-13 NOTE — Patient Instructions (Addendum)
 Proceed with CPST  when available   My office will be contacting you by phone for referral to [facility] and PFTs at [facility]   - if you don't hear back from my office within one week please call us back or notify us thru MyChart and we'll address it right away.    If we see a problem on your PFTs or CPST indicating a pulmonary issue, we will schedule you back here   If you stop smoking now, your lungs should be fine and we'll see you back as needed   Late add: - PFT's  01/22/23  FEV1 2.70 (85 % ) ratio 0.74  p 0 % improvement from saba p 0 prior to study with DLCO  19.7 (80%)   and FV curve abnormal insp> exp  suggesting upper airway obstruction with no expiratory issues so not suggestive of a lung problem > ent eval next step

## 2023-12-24 ENCOUNTER — Encounter (INDEPENDENT_AMBULATORY_CARE_PROVIDER_SITE_OTHER): Payer: Self-pay | Admitting: *Deleted

## 2024-01-07 ENCOUNTER — Encounter (INDEPENDENT_AMBULATORY_CARE_PROVIDER_SITE_OTHER): Payer: Self-pay | Admitting: *Deleted

## 2024-01-23 ENCOUNTER — Ambulatory Visit (HOSPITAL_COMMUNITY)
Admission: RE | Admit: 2024-01-23 | Discharge: 2024-01-23 | Disposition: A | Discharge status: Home / Self Care | Payer: Medicaid Other | Source: Ambulatory Visit | Attending: Internal Medicine | Admitting: Internal Medicine

## 2024-01-23 DIAGNOSIS — R0609 Other forms of dyspnea: Secondary | ICD-10-CM | POA: Diagnosis present

## 2024-01-23 LAB — PULMONARY FUNCTION TEST
DL/VA % pred: 82 %
DL/VA: 3.54 ml/min/mmHg/L
DLCO unc % pred: 80 %
DLCO unc: 19.69 ml/min/mmHg
FEF 25-75 Post: 1.58 L/s
FEF 25-75 Pre: 2.33 L/s
FEF2575-%Change-Post: -31 %
FEF2575-%Pred-Post: 59 %
FEF2575-%Pred-Pre: 87 %
FEV1-%Change-Post: -15 %
FEV1-%Pred-Post: 72 %
FEV1-%Pred-Pre: 85 %
FEV1-Post: 2.27 L
FEV1-Pre: 2.7 L
FEV1FVC-%Change-Post: -17 %
FEV1FVC-%Pred-Pre: 97 %
FEV6-%Change-Post: 1 %
FEV6-%Pred-Post: 93 %
FEV6-%Pred-Pre: 92 %
FEV6-Post: 3.7 L
FEV6-Pre: 3.63 L
FEV6FVC-%Change-Post: 0 %
FEV6FVC-%Pred-Post: 103 %
FEV6FVC-%Pred-Pre: 103 %
FVC-%Change-Post: 1 %
FVC-%Pred-Post: 90 %
FVC-%Pred-Pre: 88 %
FVC-Post: 3.74 L
FVC-Pre: 3.67 L
Post FEV1/FVC ratio: 61 %
Post FEV6/FVC ratio: 99 %
Pre FEV1/FVC ratio: 74 %
Pre FEV6/FVC Ratio: 99 %
RV % pred: 100 %
RV: 2.01 L
TLC % pred: 96 %
TLC: 5.95 L

## 2024-01-23 MED ORDER — ALBUTEROL SULFATE (2.5 MG/3ML) 0.083% IN NEBU
2.5000 mg | INHALATION_SOLUTION | Freq: Once | RESPIRATORY_TRACT | Status: AC
  Administered 2024-01-23: 2.5 mg via RESPIRATORY_TRACT

## 2024-02-06 ENCOUNTER — Institutional Professional Consult (permissible substitution) (INDEPENDENT_AMBULATORY_CARE_PROVIDER_SITE_OTHER): Payer: Medicaid Other | Admitting: Otolaryngology

## 2024-02-16 NOTE — Progress Notes (Unsigned)
 Ricky Werner, male    DOB: 11/13/1966    MRN: [id number]   Brief patient profile:  39 yowm  active smoker  referred to pulmonary clinic in [place]  07/25/2023 by Dr Sherwood Gambler  for progressive doe x 2021.    History of Present Illness  07/25/2023  Pulmonary/ 1st office eval/ Ricky Werner / [place] Office  Chief Complaint  Patient presents with   Establish Care   Shortness of Breath  Dyspnea:  maybe one or two aisles and out of breath.  Cough: dry raspy with choking sensation  Sleep: on couch with a pillow due to vertigo but can't sleep due to choking spells  SABA use: never used  02: none  Lung cancer screen:  referred  Rec Stop  lisinopril and start olmesartan 20 mg  twice daily - take only once daily if too strong  Please schedule a follow up office visit in 6 weeks, call sooner if needed     12/13/2023  f/u ov/[place] office/Ricky Werner re: ? Ace case /emphysema on LDSCT  maint on no resp meds   Chief Complaint  Patient presents with   Follow-up    Breathing is unchanged. Denies new co's.   Dyspnea:  no better =  50 ft (not reproduced in clinic) but bothered also by severe vertigo and tinnitis  Cough: raspy assoc with hoarseness and nasal obst bilaterally x years p broke nose s ent eval yet  Sleeping: flat and no longer having choking spells or    resp cc  SABA use: none  02: none  Lung cancer screening: 11/29/23 rec Proceed with CPST  when available  My office will be contacting you by phone for referral to [facility] and PFTs at [facility]   If we see a problem on your PFTs or CPST indicating a pulmonary issue, we will schedule you back here   If you stop smoking now, your lungs should be fine and we'll see you back as needed   Late add: - PFT's  01/22/23  FEV1 2.70 (85 % ) ratio 0.74  p 0 % improvement from saba p 0 prior to study with DLCO  19.7 (80%)   and FV curve abnormal insp> exp  suggesting upper airway obstruction with no expiratory issues so not suggestive of a lung  problem > ent eval next step    02/19/2024  f/u ov/[place] office/Ricky Werner re: doe/ nasal obstruction/cough    maint on no resp rx/ still smoking  Chief Complaint  Patient presents with   Follow-up    Follow up for DOE , has gotten worse b/c of pollen   Dyspnea:  mainly due to cough  Cough: mildly congested sounding / no purulent sputum / much worse since pollen started up  Sleeping: on couch arm x  one pillow freq awakening from cough    resp cc  SABA use: none  02: none   Lung cancer screening: RADS 1 1325    No obvious day to day or daytime variability or assoc excess/ purulent sputum or mucus plugs or hemoptysis or cp or chest tightness, subjective wheeze or overt  b symptoms.    Also denies any obvious fluctuation of symptoms with weather or environmental changes or other aggravating or alleviating factors except as outlined above   No unusual exposure hx or h/o childhood pna/ asthma or knowledge of premature birth.  Current Allergies, Complete Past Medical History, Past Surgical History, Family History, and Social History were reviewed in [place]  [facility] electronic medical record.  ROS  The following are not active complaints unless bolded Hoarseness, sore throat, dysphagia, dental problems, itching, sneezing,  nasal congestion or discharge of excess mucus or purulent secretions, ear ache,   fever, chills, sweats, unintended wt loss or wt gain, classically pleuritic or exertional cp,  orthopnea pnd or arm/hand swelling  or leg swelling, presyncope, palpitations, abdominal pain, anorexia, nausea, vomiting, diarrhea  or change in bowel habits or change in bladder habits, change in stools or change in urine, dysuria, hematuria,  rash, arthralgias, visual complaints, headache, numbness, weakness or ataxia or problems with walking or coordination,  change in mood or  memory.        Current Meds  Medication Sig   alprazolam (XANAX) 2 MG tablet Take 2 mg by mouth 3 (three) times daily.    amLODipine (NORVASC) 10 MG tablet Take 10 mg by mouth daily.   aspirin EC 81 MG tablet Take 81 mg by mouth daily.   cloNIDine (CATAPRES) 0.2 MG tablet Take 0.2 mg by mouth 3 (three) times daily.   escitalopram (LEXAPRO) 10 MG tablet Take 1 tablet by mouth daily.   Hyprom-Naphaz-Polysorb-Zn Sulf (CLEAR EYES COMPLETE OP) Place 1 drop into both eyes daily as needed.    levETIRAcetam (KEPPRA) 1000 MG tablet Take 1 tablet (1,000 mg total) by mouth 2 (two) times daily.   methocarbamol (ROBAXIN) 500 MG tablet Take 1 tablet (500 mg total) by mouth every 8 (eight) hours as needed for muscle spasms.   Multiple Vitamin (MULTIVITAMIN WITH MINERALS) TABS tablet Take 1 tablet by mouth daily.   naproxen (NAPROSYN) 500 MG tablet Take 1 tablet (500 mg total) by mouth 2 (two) times daily.   olmesartan (BENICAR) 20 MG tablet Take 1 tablet (20 mg total) by mouth daily. One twice daily            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    Chronic back pain    Depression    Headache    History of dental problems    Hypertension    Osteoarthritis    Panic attacks    Right tennis elbow    Sciatica    Seizures (HCC) 09/30/2020   Vertigo       Objective:    Wts  02/19/2024        180  12/13/23 177 lb 3.2 oz (80.4 kg)  12/10/23 179 lb 12.8 oz (81.6 kg)  07/25/23 173 lb (78.5 kg)    Vital signs reviewed  02/19/2024  - Note at rest 02 sats  96% on RA   General appearance:   amb wm jumping from symptom to symptom s completing prior question    HEENT : Oropharynx  clear      Nasal turbinates severe turbinate edema s pallor or polyps   NECK :  without  apparent JVD/ palpable Nodes/TM / prominent pseudowheeze    LUNGS: no acc muscle use,  Nl contour chest which is clear to A and P bilaterally without cough on insp or exp maneuvers   CV:  RRR  no s3 or murmur or increase in P2, and no edema   ABD:   soft and nontender   MS:  Gait nl   ext warm without deformities Or obvious joint restrictions  calf tenderness, cyanosis or clubbing    SKIN: warm and dry  without lesions    NEURO:  alert, approp, nl sensorium with  no motor or cerebellar deficits apparent.        MRI sinus 04/19/23  No mass or acute finding within the imaged orbits. No significant paranasal sinus disease.        Assessment

## 2024-02-17 ENCOUNTER — Ambulatory Visit (INDEPENDENT_AMBULATORY_CARE_PROVIDER_SITE_OTHER): Payer: Medicaid Other | Admitting: Gastroenterology

## 2024-02-19 ENCOUNTER — Encounter: Payer: Self-pay | Admitting: Internal Medicine

## 2024-02-19 ENCOUNTER — Ambulatory Visit (INDEPENDENT_AMBULATORY_CARE_PROVIDER_SITE_OTHER): Admitting: Internal Medicine

## 2024-02-19 VITALS — BP 119/81 | HR 113 | Ht 66.0 in | Wt 180.6 lb

## 2024-02-19 DIAGNOSIS — R051 Acute cough: Secondary | ICD-10-CM

## 2024-02-19 DIAGNOSIS — J310 Chronic rhinitis: Secondary | ICD-10-CM

## 2024-02-19 DIAGNOSIS — R0609 Other forms of dyspnea: Secondary | ICD-10-CM | POA: Diagnosis not present

## 2024-02-19 DIAGNOSIS — F17210 Nicotine dependence, cigarettes, uncomplicated: Secondary | ICD-10-CM

## 2024-02-19 MED ORDER — METHYLPREDNISOLONE ACETATE 80 MG/ML IJ SUSP
120.0000 mg | Freq: Once | INTRAMUSCULAR | Status: AC
  Administered 2024-02-19: 120 mg via INTRAMUSCULAR

## 2024-02-19 MED ORDER — ACETAMINOPHEN-CODEINE 300-30 MG PO TABS: 1.0000 | ORAL_TABLET | ORAL | 0 refills | Status: AC | PRN

## 2024-02-19 NOTE — Patient Instructions (Addendum)
 Be sure to reschedule your ENT evaluation asap - the only abnormality your have on your breathing test is your throat function and a long of your cough may be coming from your sinuses   Take delsym two tsp every 12 hours and supplement if needed with  Tylenol #3   up to 1-2 every 4 hours to suppress the urge to cough. Swallowing water and/or using ice chips/non mint and menthol containing candies (such as lifesavers or sugarless jolly ranchers) are also effective.  You should rest your voice and avoid activities that you know make you cough.  Once you have eliminated the cough for 3 straight days try reducing the Tylenol #3 first,  then the delsym as tolerated.     Depomedrol 120 mg IM   The key is to stop smoking completely before smoking completely stops you!       Please schedule a follow up visit in 3 months but call sooner if needed    [facility].) [address] [place],  [place]  [address]  Get Driving Directions Main: [phone]  Fax: [phone]  Sunday:Closed Monday:8:30 AM - 5:00 PM Tuesday:8:30 AM - 5:00 PM Wednesday:8:30 AM - 5:00 PM Thursday:8:30 AM - 5:00 PM Friday:8:30 AM - 5:00 PM Saturday:Closed

## 2024-02-20 NOTE — Assessment & Plan Note (Addendum)
 Referred to [facility] ent 12/13/2023 > still has not gone as of 02/19/2024   - Will try to expedite re-referral, in meantime to control symptoms of severe cough and nasal congestion:  Take delsym two tsp every 12 hours and supplement if needed with  Tylenol #3   up to 1-2 every 4 hours to suppress the urge to cough. Swallowing water and/or using ice chips/non mint and menthol containing candies (such as lifesavers or sugarless jolly ranchers) are also effective.  You should rest your voice and avoid activities that you know make you cough.  Once you have eliminated the cough for 3 straight days try reducing the Tylenol #3 first,  then the delsym as tolerated.     Depomedrol 120 mg IM    Follow up  here 3 m         Each maintenance medication was reviewed in detail including emphasizing most importantly the difference between maintenance and prns and under what circumstances the prns are to be triggered using an action plan format where appropriate.  Total time for H and P, chart review, counseling, reviewing nasal  device(s) and generating customized AVS unique to this office visit / same day charting = 25 min

## 2024-02-20 NOTE — Assessment & Plan Note (Signed)
 Active smoker - onset 2021 > progressed to 50 ft by time of 1st pulmonary ov 07/25/2023  - 07/25/2023   Walked on RA  x  3  lap(s) =  approx 450  ft  @ slow to mod  pace, stopped due to end of study with lowest 02 sats 98% but unsteady on feet  - 07/25/2023 try off acei > no better 12/13/2023  - 12/13/2023   Walked on RA   x  3  lap(s) =  approx 450  ft  @ avg/ cane pace, stopped due to average pace with cane right hand/stopped x 2 to rest due to vertigo and leg fatigue/no SOB/lmr  with lowest 02 sats 98%   - PFT's  01/22/23  FEV1 2.70 (85 % ) ratio 0.74  p 0 % improvement from saba p 0 prior to study with DLCO  19.7 (80%)   and FV curve abnormal insp> exp     He is clear on exam x for psuedowheeze > ent eval next

## 2024-02-20 NOTE — Assessment & Plan Note (Signed)
 4-5 min discussion re active cigarette smoking in addition to office E&M  Ask about tobacco use:   ongoing  Advise quitting     I reviewed the Fletcher curve with the patient that basically indicates that  if you quit smoking when your best day FEV1 is still well preserved (as is clearly still   the case here)  it is highly unlikely you will progress to severe disease and informed the patient there was  no medication on the market that has proven to alter the curve/ its downward trajectory  or the likelihood of progression of their disease(unlike other chronic medical conditions such as atheroclerosis where we do think we can change the natural hx with risk reducing meds)    Therefore stopping smoking and maintaining abstinence are  the most important aspects of his care, not choice of inhalers or for that matter, Pulmonary doctors.   Treatment other than smoking cessation  is entirely directed by severity of symptoms and focused also on reducing exacerbations, not attempting to change the natural history of the disease. Should either issue become problematic, then escalation of maintenance and prn resp meds may be warranted "the punishment needs to fit the crime"   Assess willingness:  Not committed at this point Assist in quit attempt:  Per PCP when ready Arrange follow up:   Follow up per Primary Care planned   hi

## 2024-03-11 ENCOUNTER — Ambulatory Visit (INDEPENDENT_AMBULATORY_CARE_PROVIDER_SITE_OTHER): Admitting: Gastroenterology

## 2024-03-11 ENCOUNTER — Telehealth (INDEPENDENT_AMBULATORY_CARE_PROVIDER_SITE_OTHER): Payer: Self-pay | Admitting: Gastroenterology

## 2024-03-11 ENCOUNTER — Encounter (INDEPENDENT_AMBULATORY_CARE_PROVIDER_SITE_OTHER): Payer: Self-pay | Admitting: Gastroenterology

## 2024-03-11 VITALS — BP 130/79 | HR 111 | Temp 98.2°F | Ht 66.0 in | Wt 179.2 lb

## 2024-03-11 DIAGNOSIS — R195 Other fecal abnormalities: Secondary | ICD-10-CM | POA: Diagnosis not present

## 2024-03-11 DIAGNOSIS — Z6829 Body mass index (BMI) 29.0-29.9, adult: Secondary | ICD-10-CM

## 2024-03-11 NOTE — Patient Instructions (Signed)
 It was very nice to meet you today, as dicussed with will plan for the following :  1) Colonoscopy ; after pulmonary clearance

## 2024-03-11 NOTE — Telephone Encounter (Signed)
    03/11/24  Judythe Nurse [birth year]  What type of surgery is being performed? Colonoscopy  When is surgery scheduled? TBD  What type of clearance is required (medical or pharmacy to hold medication or both? Pulmonary Clearance  Are there any medications that need to be held prior to surgery and how long? N/A  Name of physician performing surgery?  Dr. Lorie Rook [facility]ology at [facility] Phone: [phone] Fax: [phone]  Anethesia type (none, local, MAC, general)? MAC

## 2024-03-11 NOTE — Progress Notes (Signed)
 Ricky Werner Ricky Werner , M.D. Gastroenterology & Hepatology [facility] [address] [place], [place] [address] Primary Care Physician: Ricky Parkins, MD [address] [place] [address]  Chief Complaint:  Positive cologuard   History of Present Illness: Ricky Werner is a 58 y.o. male with HTN, HLD , anxiety depression who presents for evaluation of Positive cologuard   Patient reports regular bowel movement.  He appears to be circular with providing his history . Patient had a pulmonary function test and demonstrated "problems with airflow in the upper airway with normal lungs and patient was referred to ENT".  Echocardiogram with EF 55 to 60% The patient denies having any nausea, vomiting, fever, chills, hematochezia, melena, hematemesis, abdominal distention, abdominal pain, diarrhea, jaundice, pruritus or weight loss.  Patient continues to report exertional dyspnea  Last KGM:WNUU Last Colonoscopy:non  FHx: neg for any gastrointestinal/liver disease, no malignancies Social: neg smoking, alcohol or illicit drug use Surgical: no abdominal surgeries  Patient has positive Cologuard since 08/2023  No recent labs on file Past Medical History: 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    Chronic back pain    Depression    Headache    History of dental problems    Hypertension    Osteoarthritis    Panic attacks    Right tennis elbow    Sciatica    Seizures (HCC) 09/30/2020   Vertigo     Past Surgical History: Past Surgical History:  Procedure Laterality Date   KIDNEY STONE SURGERY      Family History: Family History  Problem Relation Age of Onset   Heart failure Mother    Hypertension Mother    Hyperlipidemia Mother    Diabetes Mother    Breast cancer Mother    Stroke Father    Hypertension Father     Hypertension Brother     Social History: Social History   Tobacco Use  Smoking Status Every Day   Current packs/day: 1.00   Average packs/day: 1 pack/day for 39.0 years (39.0 ttl pk-yrs)   Types: Cigarettes   Passive exposure: Current  Smokeless Tobacco Never   Social History   Substance and Sexual Activity  Alcohol Use Not Currently   Alcohol/week: 6.0 standard drinks of alcohol   Types: 6 Cans of beer per week   Comment: pt describes depends on what week may have 5th of liqour within a week and go a week without   Social History   Substance and Sexual Activity  Drug Use Not Currently   Types: Marijuana   Comment: occ    Allergies: No Known Allergies  Medications: Current Outpatient Medications  Medication Sig Dispense Refill   alprazolam (XANAX) 2 MG tablet Take 2 mg by mouth 3 (three) times daily.     amLODipine (NORVASC) 10 MG tablet Take 10 mg by mouth daily.  6   aspirin EC 81 MG tablet Take 81 mg by mouth daily.     cloNIDine (CATAPRES) 0.2 MG tablet Take 0.2 mg by mouth 3 (three) times daily.     escitalopram (LEXAPRO) 10 MG tablet Take 1 tablet by mouth daily.     Hyprom-Naphaz-Polysorb-Zn Sulf (CLEAR EYES COMPLETE OP) Place 1 drop into both eyes daily as needed.      levETIRAcetam (KEPPRA) 1000 MG tablet Take 1 tablet (1,000 mg total) by  mouth 2 (two) times daily. 180 tablet 3   methocarbamol (ROBAXIN) 500 MG tablet Take 1 tablet (500 mg total) by mouth every 8 (eight) hours as needed for muscle spasms. 30 tablet 0   Multiple Vitamin (MULTIVITAMIN WITH MINERALS) TABS tablet Take 1 tablet by mouth daily.     naproxen (NAPROSYN) 500 MG tablet Take 1 tablet (500 mg total) by mouth 2 (two) times daily. 30 tablet 0   olmesartan (BENICAR) 20 MG tablet Take 1 tablet (20 mg total) by mouth daily. One twice daily 60 tablet 11   No current facility-administered medications for this visit.    Review of Systems: GENERAL: negative for malaise, night sweats HEENT: No  changes in hearing or vision, no nose bleeds or other nasal problems. NECK: Negative for lumps, goiter, pain and significant neck swelling RESPIRATORY: Negative for cough, wheezing CARDIOVASCULAR: Negative for chest pain, leg swelling, palpitations, orthopnea GI: SEE HPI MUSCULOSKELETAL: Negative for joint pain or swelling, back pain, and muscle pain. SKIN: Negative for lesions, rash HEMATOLOGY Negative for prolonged bleeding, bruising easily, and swollen nodes. ENDOCRINE: Negative for cold or heat intolerance, polyuria, polydipsia and goiter. NEURO: negative for tremor, gait imbalance, syncope and seizures. The remainder of the review of systems is noncontributory.   Physical Exam: BP 130/79   Pulse (!) 111   Temp 98.2 F (36.8 C) (Oral)   Ht 5\' 6"  (1.676 m)   Wt 179 lb 3.2 oz (81.3 kg)   BMI 28.92 kg/m  GENERAL: The patient is AO x3, in no acute distress. HEENT: Head is normocephalic and atraumatic. EOMI are intact. Mouth is well hydrated and without lesions. NECK: Supple. No masses LUNGS: Clear to auscultation. No presence of rhonchi/wheezing/rales. Adequate chest expansion HEART: RRR, normal s1 and s2. ABDOMEN: Soft, nontender, no guarding, no peritoneal signs, and nondistended. BS +. No masses.  Imaging/Labs: as above     Latest Ref Rng & Units 08/16/2020    3:27 PM 11/24/2018    4:51 PM 10/19/2018   11:43 AM  CBC  WBC 4.0 - 10.5 K/uL 9.8  11.4  11.5   Hemoglobin 13.0 - 17.0 g/dL 16.1  09.6  04.5   Hematocrit 39.0 - 52.0 % 37.2  46.5  43.5   Platelets 150 - 400 K/uL 279  290  337    No results found for: "IRON", "TIBC", "FERRITIN"  I personally reviewed and interpreted the available labs, imaging and endoscopic files.  Impression and Plan: Ricky Werner is a 58 y.o. male with HTN, HLD , anxiety depression who presents for evaluation of Positive cologuard   #Positive Cologuard  Patient does not have any high risk factors for colorectal cancer malignancy.  He  has been asymptomatic. Discussed cologuard  results in detail, specifically what it means when the test is positive or negative.  Discussed that there is a possibility that even when the test is positive there may not be a polyp found on colonoscopy. More than 50% of the office visit was dedicated to discussing the procedure, including the day of and risks involved. Patient understands what the procedure involves including the benefits and any risks. Patient understands alternatives to the proposed procedure. Risks including (but not limited to) bleeding, tearing of the lining (perforation), rupture of adjacent organs, problems with heart and lung function, infection, and medication reactions. A small percentage of complications may require surgery, hospitalization, repeat endoscopic procedure, and/or transfusion. A small percentage of polyps and other tumors may not be seen.  -  Schedule colonoscopy -Given continues dyspnea would request pulmonary clearance prior to the procedure   #BMI 29      - walking at a brisk pace/biking at moderate intesity 2.5-5 hours per week     - use pedometer/step counter to track activity     - goal to lose 5-10% of initial body weight     - avoid suagry drinks and juices, use zero calorie beverages     - increase water intake     - eat a low carb diet with plenty of veggies and fruit     - Get sufficient sleep 7-8 hrs nightly     - maitain active lifestyle     - avoid alcohol     - recommend 2-3 cups Coffee daily     - Counsel on lowering cholesterol by having a diet rich in vegetables,          protein (avoid red meats) and good fats(fish, salmon).  All questions were answered.      Aubriegh Minch Ricky Allante Beane, MD Gastroenterology and Hepatology [facility]   This chart has been completed using [facility] Dictation software, and while attempts have been made to ensure accuracy , certain words and phrases may not be transcribed as  intended

## 2024-03-12 NOTE — Telephone Encounter (Signed)
 Was supposed to see ENT next - I can clear him once they see him and I reviewe their note

## 2024-03-12 NOTE — Telephone Encounter (Signed)
 Informed patient of Dr. Geraldean Klein message-patient verbalized understanding

## 2024-03-24 NOTE — Telephone Encounter (Signed)
 Pt left voicemail in regards to scheduling colonoscopy. Returned call to pt and advised him that I was waiting on clearance.  Pt will need to see ENT and then Dr.Wert can give clearance.

## 2024-03-25 ENCOUNTER — Inpatient Hospital Stay (HOSPITAL_COMMUNITY)

## 2024-03-25 ENCOUNTER — Emergency Department (HOSPITAL_COMMUNITY)

## 2024-03-25 ENCOUNTER — Inpatient Hospital Stay (HOSPITAL_COMMUNITY)
Admission: EM | Admit: 2024-03-25 | Discharge: 2024-04-07 | DRG: 870 | Disposition: A | Discharge status: Home / Self Care | Attending: Internal Medicine | Admitting: Internal Medicine

## 2024-03-25 DIAGNOSIS — I5032 Chronic diastolic (congestive) heart failure: Secondary | ICD-10-CM | POA: Diagnosis not present

## 2024-03-25 DIAGNOSIS — F1020 Alcohol dependence, uncomplicated: Secondary | ICD-10-CM | POA: Diagnosis present

## 2024-03-25 DIAGNOSIS — R569 Unspecified convulsions: Secondary | ICD-10-CM

## 2024-03-25 DIAGNOSIS — N401 Enlarged prostate with lower urinary tract symptoms: Secondary | ICD-10-CM | POA: Diagnosis present

## 2024-03-25 DIAGNOSIS — N179 Acute kidney failure, unspecified: Secondary | ICD-10-CM

## 2024-03-25 DIAGNOSIS — Z23 Encounter for immunization: Secondary | ICD-10-CM | POA: Diagnosis not present

## 2024-03-25 DIAGNOSIS — Z8249 Family history of ischemic heart disease and other diseases of the circulatory system: Secondary | ICD-10-CM

## 2024-03-25 DIAGNOSIS — Z7151 Drug abuse counseling and surveillance of drug abuser: Secondary | ICD-10-CM

## 2024-03-25 DIAGNOSIS — G9340 Encephalopathy, unspecified: Secondary | ICD-10-CM | POA: Diagnosis present

## 2024-03-25 DIAGNOSIS — R338 Other retention of urine: Secondary | ICD-10-CM | POA: Diagnosis present

## 2024-03-25 DIAGNOSIS — A419 Sepsis, unspecified organism: Principal | ICD-10-CM | POA: Diagnosis present

## 2024-03-25 DIAGNOSIS — G959 Disease of spinal cord, unspecified: Secondary | ICD-10-CM | POA: Diagnosis not present

## 2024-03-25 DIAGNOSIS — Z79899 Other long term (current) drug therapy: Secondary | ICD-10-CM

## 2024-03-25 DIAGNOSIS — R4189 Other symptoms and signs involving cognitive functions and awareness: Secondary | ICD-10-CM | POA: Diagnosis present

## 2024-03-25 DIAGNOSIS — R579 Shock, unspecified: Secondary | ICD-10-CM | POA: Diagnosis present

## 2024-03-25 DIAGNOSIS — F1120 Opioid dependence, uncomplicated: Secondary | ICD-10-CM | POA: Diagnosis not present

## 2024-03-25 DIAGNOSIS — R001 Bradycardia, unspecified: Secondary | ICD-10-CM | POA: Diagnosis not present

## 2024-03-25 DIAGNOSIS — I2489 Other forms of acute ischemic heart disease: Secondary | ICD-10-CM | POA: Diagnosis present

## 2024-03-25 DIAGNOSIS — R6521 Severe sepsis with septic shock: Secondary | ICD-10-CM | POA: Diagnosis present

## 2024-03-25 DIAGNOSIS — E876 Hypokalemia: Secondary | ICD-10-CM | POA: Diagnosis present

## 2024-03-25 DIAGNOSIS — J690 Pneumonitis due to inhalation of food and vomit: Secondary | ICD-10-CM | POA: Diagnosis not present

## 2024-03-25 DIAGNOSIS — Z1152 Encounter for screening for COVID-19: Secondary | ICD-10-CM | POA: Diagnosis not present

## 2024-03-25 DIAGNOSIS — R7881 Bacteremia: Secondary | ICD-10-CM | POA: Diagnosis present

## 2024-03-25 DIAGNOSIS — F05 Delirium due to known physiological condition: Secondary | ICD-10-CM | POA: Diagnosis not present

## 2024-03-25 DIAGNOSIS — F1410 Cocaine abuse, uncomplicated: Secondary | ICD-10-CM

## 2024-03-25 DIAGNOSIS — I469 Cardiac arrest, cause unspecified: Secondary | ICD-10-CM | POA: Diagnosis not present

## 2024-03-25 DIAGNOSIS — Z6830 Body mass index (BMI) 30.0-30.9, adult: Secondary | ICD-10-CM

## 2024-03-25 DIAGNOSIS — R739 Hyperglycemia, unspecified: Secondary | ICD-10-CM | POA: Diagnosis present

## 2024-03-25 DIAGNOSIS — G8384 Todd's paralysis (postepileptic): Secondary | ICD-10-CM | POA: Diagnosis not present

## 2024-03-25 DIAGNOSIS — R34 Anuria and oliguria: Secondary | ICD-10-CM | POA: Diagnosis not present

## 2024-03-25 DIAGNOSIS — R339 Retention of urine, unspecified: Secondary | ICD-10-CM | POA: Diagnosis not present

## 2024-03-25 DIAGNOSIS — G40409 Other generalized epilepsy and epileptic syndromes, not intractable, without status epilepticus: Secondary | ICD-10-CM | POA: Diagnosis not present

## 2024-03-25 DIAGNOSIS — R0902 Hypoxemia: Secondary | ICD-10-CM

## 2024-03-25 DIAGNOSIS — F1490 Cocaine use, unspecified, uncomplicated: Secondary | ICD-10-CM | POA: Diagnosis present

## 2024-03-25 DIAGNOSIS — G8929 Other chronic pain: Secondary | ICD-10-CM | POA: Diagnosis present

## 2024-03-25 DIAGNOSIS — I110 Hypertensive heart disease with heart failure: Secondary | ICD-10-CM | POA: Diagnosis not present

## 2024-03-25 DIAGNOSIS — E669 Obesity, unspecified: Secondary | ICD-10-CM | POA: Diagnosis present

## 2024-03-25 DIAGNOSIS — E860 Dehydration: Secondary | ICD-10-CM | POA: Diagnosis present

## 2024-03-25 DIAGNOSIS — J189 Pneumonia, unspecified organism: Secondary | ICD-10-CM | POA: Diagnosis not present

## 2024-03-25 DIAGNOSIS — F419 Anxiety disorder, unspecified: Secondary | ICD-10-CM | POA: Diagnosis present

## 2024-03-25 DIAGNOSIS — F10939 Alcohol use, unspecified with withdrawal, unspecified: Secondary | ICD-10-CM | POA: Diagnosis not present

## 2024-03-25 DIAGNOSIS — Z6832 Body mass index (BMI) 32.0-32.9, adult: Secondary | ICD-10-CM

## 2024-03-25 DIAGNOSIS — F10139 Alcohol abuse with withdrawal, unspecified: Secondary | ICD-10-CM | POA: Diagnosis not present

## 2024-03-25 DIAGNOSIS — F32A Depression, unspecified: Secondary | ICD-10-CM | POA: Diagnosis present

## 2024-03-25 DIAGNOSIS — E872 Acidosis, unspecified: Secondary | ICD-10-CM | POA: Diagnosis present

## 2024-03-25 DIAGNOSIS — J9601 Acute respiratory failure with hypoxia: Secondary | ICD-10-CM

## 2024-03-25 DIAGNOSIS — F1320 Sedative, hypnotic or anxiolytic dependence, uncomplicated: Secondary | ICD-10-CM | POA: Diagnosis present

## 2024-03-25 DIAGNOSIS — R7401 Elevation of levels of liver transaminase levels: Secondary | ICD-10-CM | POA: Diagnosis not present

## 2024-03-25 DIAGNOSIS — J188 Other pneumonia, unspecified organism: Secondary | ICD-10-CM

## 2024-03-25 DIAGNOSIS — G928 Other toxic encephalopathy: Secondary | ICD-10-CM | POA: Diagnosis not present

## 2024-03-25 DIAGNOSIS — F17210 Nicotine dependence, cigarettes, uncomplicated: Secondary | ICD-10-CM | POA: Diagnosis present

## 2024-03-25 DIAGNOSIS — J9602 Acute respiratory failure with hypercapnia: Secondary | ICD-10-CM | POA: Diagnosis present

## 2024-03-25 DIAGNOSIS — Z8674 Personal history of sudden cardiac arrest: Secondary | ICD-10-CM

## 2024-03-25 DIAGNOSIS — N289 Disorder of kidney and ureter, unspecified: Secondary | ICD-10-CM | POA: Diagnosis not present

## 2024-03-25 DIAGNOSIS — Z781 Physical restraint status: Secondary | ICD-10-CM

## 2024-03-25 DIAGNOSIS — Z7982 Long term (current) use of aspirin: Secondary | ICD-10-CM

## 2024-03-25 DIAGNOSIS — B9689 Other specified bacterial agents as the cause of diseases classified elsewhere: Secondary | ICD-10-CM | POA: Diagnosis present

## 2024-03-25 DIAGNOSIS — J90 Pleural effusion, not elsewhere classified: Secondary | ICD-10-CM | POA: Diagnosis not present

## 2024-03-25 DIAGNOSIS — R5381 Other malaise: Secondary | ICD-10-CM | POA: Diagnosis present

## 2024-03-25 DIAGNOSIS — R1312 Dysphagia, oropharyngeal phase: Secondary | ICD-10-CM | POA: Diagnosis present

## 2024-03-25 DIAGNOSIS — Z833 Family history of diabetes mellitus: Secondary | ICD-10-CM

## 2024-03-25 LAB — CBC
HCT: 40.4 % (ref 39.0–52.0)
Hemoglobin: 12.8 g/dL — ABNORMAL LOW (ref 13.0–17.0)
MCH: 31.1 pg (ref 26.0–34.0)
MCHC: 31.7 g/dL (ref 30.0–36.0)
MCV: 98.1 fL (ref 80.0–100.0)
Platelets: 275 10*3/uL (ref 150–400)
RBC: 4.12 MIL/uL — ABNORMAL LOW (ref 4.22–5.81)
RDW: 13.2 % (ref 11.5–15.5)
WBC: 15 10*3/uL — ABNORMAL HIGH (ref 4.0–10.5)
nRBC: 0 % (ref 0.0–0.2)

## 2024-03-25 LAB — URINALYSIS, W/ REFLEX TO CULTURE (INFECTION SUSPECTED)
Bacteria, UA: NONE SEEN
Bilirubin Urine: NEGATIVE
Glucose, UA: >=500 mg/dL — AB
Hgb urine dipstick: NEGATIVE
Ketones, ur: NEGATIVE mg/dL
Leukocytes,Ua: NEGATIVE
Nitrite: NEGATIVE
Protein, ur: 30 mg/dL — AB
Specific Gravity, Urine: 1.018 (ref 1.005–1.030)
pH: 5 (ref 5.0–8.0)

## 2024-03-25 LAB — BLOOD GAS, ARTERIAL
Acid-base deficit: 1.6 mmol/L (ref 0.0–2.0)
Bicarbonate: 25.6 mmol/L (ref 20.0–28.0)
Drawn by: 28340
O2 Saturation: 97.7 %
Patient temperature: 37
pCO2 arterial: 52 mmHg — ABNORMAL HIGH (ref 32–48)
pH, Arterial: 7.3 — ABNORMAL LOW (ref 7.35–7.45)
pO2, Arterial: 80 mmHg — ABNORMAL LOW (ref 83–108)

## 2024-03-25 LAB — COMPREHENSIVE METABOLIC PANEL WITH GFR
ALT: 83 U/L — ABNORMAL HIGH (ref 0–44)
AST: 64 U/L — ABNORMAL HIGH (ref 15–41)
Albumin: 3.9 g/dL (ref 3.5–5.0)
Alkaline Phosphatase: 39 U/L (ref 38–126)
Anion gap: 21 — ABNORMAL HIGH (ref 5–15)
BUN: 23 mg/dL — ABNORMAL HIGH (ref 6–20)
CO2: 15 mmol/L — ABNORMAL LOW (ref 22–32)
Calcium: 8.6 mg/dL — ABNORMAL LOW (ref 8.9–10.3)
Chloride: 99 mmol/L (ref 98–111)
Creatinine, Ser: 1.96 mg/dL — ABNORMAL HIGH (ref 0.61–1.24)
GFR, Estimated: 38 mL/min — ABNORMAL LOW (ref >60)
Glucose, Bld: 459 mg/dL — ABNORMAL HIGH (ref 70–99)
Potassium: 4.1 mmol/L (ref 3.5–5.1)
Sodium: 135 mmol/L (ref 135–145)
Total Bilirubin: 0.7 mg/dL (ref 0.0–1.2)
Total Protein: 6.9 g/dL (ref 6.5–8.1)

## 2024-03-25 LAB — RAPID URINE DRUG SCREEN, HOSP PERFORMED
Amphetamines: POSITIVE — AB
Barbiturates: NOT DETECTED
Benzodiazepines: POSITIVE — AB
Cocaine: POSITIVE — AB
Opiates: NOT DETECTED
Tetrahydrocannabinol: NOT DETECTED

## 2024-03-25 LAB — BETA-HYDROXYBUTYRIC ACID: Beta-Hydroxybutyric Acid: 0.05 mmol/L (ref 0.05–0.27)

## 2024-03-25 LAB — I-STAT CHEM 8, ED
BUN: 25 mg/dL — ABNORMAL HIGH (ref 6–20)
Calcium, Ion: 1.07 mmol/L — ABNORMAL LOW (ref 1.15–1.40)
Chloride: 101 mmol/L (ref 98–111)
Creatinine, Ser: 1.9 mg/dL — ABNORMAL HIGH (ref 0.61–1.24)
Glucose, Bld: 453 mg/dL — ABNORMAL HIGH (ref 70–99)
HCT: 41 % (ref 39.0–52.0)
Hemoglobin: 13.9 g/dL (ref 13.0–17.0)
Potassium: 4.2 mmol/L (ref 3.5–5.1)
Sodium: 136 mmol/L (ref 135–145)
TCO2: 19 mmol/L — ABNORMAL LOW (ref 22–32)

## 2024-03-25 LAB — TROPONIN I (HIGH SENSITIVITY)
Troponin I (High Sensitivity): 12 ng/L (ref <18)
Troponin I (High Sensitivity): 44 ng/L — ABNORMAL HIGH (ref <18)

## 2024-03-25 LAB — DIFFERENTIAL
Abs Immature Granulocytes: 0.8 10*3/uL — ABNORMAL HIGH (ref 0.00–0.07)
Basophils Absolute: 0 10*3/uL (ref 0.0–0.1)
Basophils Relative: 0 %
Eosinophils Absolute: 0.2 10*3/uL (ref 0.0–0.5)
Eosinophils Relative: 1 %
Lymphocytes Relative: 21 %
Lymphs Abs: 3.2 10*3/uL (ref 0.7–4.0)
Metamyelocytes Relative: 3 %
Monocytes Absolute: 0.6 10*3/uL (ref 0.1–1.0)
Monocytes Relative: 4 %
Myelocytes: 2 %
Neutro Abs: 10.4 10*3/uL — ABNORMAL HIGH (ref 1.7–7.7)
Neutrophils Relative %: 69 %

## 2024-03-25 LAB — RESP PANEL BY RT-PCR (RSV, FLU A&B, COVID)  RVPGX2
Influenza A by PCR: NEGATIVE
Influenza B by PCR: NEGATIVE
Resp Syncytial Virus by PCR: NEGATIVE
SARS Coronavirus 2 by RT PCR: NEGATIVE

## 2024-03-25 LAB — CBG MONITORING, ED
Glucose-Capillary: 489 mg/dL — ABNORMAL HIGH (ref 70–99)
Glucose-Capillary: 195 mg/dL — ABNORMAL HIGH (ref 70–99)

## 2024-03-25 LAB — MAGNESIUM: Magnesium: 1.9 mg/dL (ref 1.7–2.4)

## 2024-03-25 LAB — CK: Total CK: 401 U/L — ABNORMAL HIGH (ref 49–397)

## 2024-03-25 LAB — PROTIME-INR
INR: 1.2 (ref 0.8–1.2)
Prothrombin Time: 15 s (ref 11.4–15.2)

## 2024-03-25 LAB — VITAMIN B12: Vitamin B-12: 379 pg/mL (ref 180–914)

## 2024-03-25 LAB — ETHANOL: Alcohol, Ethyl (B): <15 mg/dL (ref <15)

## 2024-03-25 LAB — LACTIC ACID, PLASMA: Lactic Acid, Venous: 3 mmol/L (ref 0.5–1.9)

## 2024-03-25 LAB — APTT: aPTT: 24 s (ref 24–36)

## 2024-03-25 LAB — GLUCOSE, CAPILLARY: Glucose-Capillary: 73 mg/dL (ref 70–99)

## 2024-03-25 MED ORDER — POLYETHYLENE GLYCOL 3350 17 G PO PACK: 17.0000 g | PACK | Freq: Every day | ORAL | Status: DC | PRN

## 2024-03-25 MED ORDER — DOCUSATE SODIUM 100 MG PO CAPS: 100.0000 mg | ORAL_CAPSULE | Freq: Two times a day (BID) | ORAL | Status: DC | PRN

## 2024-03-25 MED ORDER — LEVETIRACETAM IN NACL 1000 MG/100ML IV SOLN: 1000.0000 mg | Freq: Two times a day (BID) | INTRAVENOUS | Status: DC

## 2024-03-25 MED ORDER — FENTANYL BOLUS VIA INFUSION
50.0000 ug | INTRAVENOUS | Status: DC | PRN
  Administered 2024-03-26 (×5): 50 ug via INTRAVENOUS

## 2024-03-25 MED ORDER — DEXMEDETOMIDINE HCL IN NACL 400 MCG/100ML IV SOLN
0.0000 ug/kg/h | INTRAVENOUS | Status: DC
  Administered 2024-03-25 – 2024-03-26 (×2): 0.4 ug/kg/h via INTRAVENOUS
  Administered 2024-03-27: 0.6 ug/kg/h via INTRAVENOUS
  Administered 2024-03-28: 0.4 ug/kg/h via INTRAVENOUS
  Administered 2024-03-28: 0.6 ug/kg/h via INTRAVENOUS
  Filled 2024-03-25 (×5): qty 100

## 2024-03-25 MED ORDER — LACTATED RINGERS IV BOLUS
1000.0000 mL | Freq: Once | INTRAVENOUS | Status: AC
  Administered 2024-03-25: 1000 mL via INTRAVENOUS

## 2024-03-25 MED ORDER — FAMOTIDINE 20 MG PO TABS
20.0000 mg | ORAL_TABLET | Freq: Two times a day (BID) | ORAL | Status: DC
  Administered 2024-03-25 – 2024-03-30 (×10): 20 mg
  Filled 2024-03-25 (×11): qty 1

## 2024-03-25 MED ORDER — THIAMINE HCL 100 MG/ML IJ SOLN
100.0000 mg | Freq: Every day | INTRAMUSCULAR | Status: DC
  Administered 2024-03-26 – 2024-03-29 (×4): 100 mg via INTRAVENOUS
  Filled 2024-03-25 (×5): qty 2

## 2024-03-25 MED ORDER — SUCCINYLCHOLINE CHLORIDE 200 MG/10ML IV SOSY
PREFILLED_SYRINGE | INTRAVENOUS | Status: AC
  Administered 2024-03-25: 100 mg via INTRAVENOUS
  Filled 2024-03-25: qty 10

## 2024-03-25 MED ORDER — SODIUM CHLORIDE 0.9 % IV SOLN: 250.0000 mL | INTRAVENOUS | Status: AC

## 2024-03-25 MED ORDER — MIDAZOLAM HCL 5 MG/5ML IJ SOLN
4.0000 mg | Freq: Once | INTRAMUSCULAR | Status: AC
  Administered 2024-03-25: 4 mg via INTRAVENOUS
  Filled 2024-03-25: qty 5

## 2024-03-25 MED ORDER — FOLIC ACID 1 MG PO TABS
1.0000 mg | ORAL_TABLET | Freq: Every day | ORAL | Status: DC
  Administered 2024-03-26 – 2024-03-30 (×5): 1 mg
  Filled 2024-03-25 (×5): qty 1

## 2024-03-25 MED ORDER — MIDAZOLAM-SODIUM CHLORIDE 100-0.9 MG/100ML-% IV SOLN
0.5000 mg/h | INTRAVENOUS | Status: DC
Start: 2024-03-25 — End: 2024-03-26
  Administered 2024-03-25: 0.5 mg/h via INTRAVENOUS
  Filled 2024-03-25 (×2): qty 100

## 2024-03-25 MED ORDER — DOCUSATE SODIUM 50 MG/5ML PO LIQD
100.0000 mg | Freq: Two times a day (BID) | ORAL | Status: DC
  Administered 2024-03-25 – 2024-03-29 (×9): 100 mg
  Filled 2024-03-25 (×9): qty 10

## 2024-03-25 MED ORDER — SODIUM CHLORIDE 0.9 % IV SOLN
3.0000 g | Freq: Four times a day (QID) | INTRAVENOUS | Status: AC
  Administered 2024-03-25 – 2024-03-31 (×24): 3 g via INTRAVENOUS
  Filled 2024-03-25 (×24): qty 8

## 2024-03-25 MED ORDER — NOREPINEPHRINE 4 MG/250ML-% IV SOLN
2.0000 ug/min | INTRAVENOUS | Status: DC
  Administered 2024-03-25: 2 ug/min via INTRAVENOUS
  Administered 2024-03-26: 5 ug/min via INTRAVENOUS
  Administered 2024-03-26: 9 ug/min via INTRAVENOUS
  Administered 2024-03-26: 11 ug/min via INTRAVENOUS
  Filled 2024-03-25 (×4): qty 250

## 2024-03-25 MED ORDER — FAMOTIDINE 20 MG PO TABS: 20.0000 mg | ORAL_TABLET | Freq: Two times a day (BID) | ORAL | Status: DC

## 2024-03-25 MED ORDER — INSULIN ASPART 100 UNIT/ML IJ SOLN
10.0000 [IU] | Freq: Once | INTRAMUSCULAR | Status: AC
  Administered 2024-03-25: 10 [IU] via SUBCUTANEOUS
  Filled 2024-03-25: qty 1

## 2024-03-25 MED ORDER — SODIUM CHLORIDE 0.9 % IV SOLN
1.0000 g | Freq: Once | INTRAVENOUS | Status: AC
  Administered 2024-03-25: 1 g via INTRAVENOUS
  Filled 2024-03-25: qty 10

## 2024-03-25 MED ORDER — SODIUM CHLORIDE 0.9 % IV SOLN
2000.0000 mg | Freq: Once | INTRAVENOUS | Status: DC
  Filled 2024-03-25: qty 20

## 2024-03-25 MED ORDER — FENTANYL 2500MCG IN NS 250ML (10MCG/ML) PREMIX INFUSION
INTRAVENOUS | Status: AC
  Filled 2024-03-25: qty 250

## 2024-03-25 MED ORDER — LEVETIRACETAM IN NACL 1000 MG/100ML IV SOLN
1000.0000 mg | Freq: Two times a day (BID) | INTRAVENOUS | Status: DC
  Administered 2024-03-26 – 2024-03-31 (×11): 1000 mg via INTRAVENOUS
  Filled 2024-03-25 (×11): qty 100

## 2024-03-25 MED ORDER — CHLORHEXIDINE GLUCONATE CLOTH 2 % EX PADS: 6.0000 | MEDICATED_PAD | Freq: Every day | CUTANEOUS | Status: DC

## 2024-03-25 MED ORDER — LACTATED RINGERS IV SOLN: INTRAVENOUS | Status: AC

## 2024-03-25 MED ORDER — LEVETIRACETAM IN NACL 1000 MG/100ML IV SOLN
2000.0000 mg | Freq: Once | INTRAVENOUS | Status: AC
  Administered 2024-03-25: 2000 mg via INTRAVENOUS
  Filled 2024-03-25: qty 200

## 2024-03-25 MED ORDER — INSULIN ASPART 100 UNIT/ML IJ SOLN
0.0000 [IU] | INTRAMUSCULAR | Status: DC
  Administered 2024-03-26 – 2024-04-03 (×21): 1 [IU] via SUBCUTANEOUS

## 2024-03-25 MED ORDER — NOREPINEPHRINE 4 MG/250ML-% IV SOLN: 0.0000 ug/min | INTRAVENOUS | Status: DC

## 2024-03-25 MED ORDER — ETOMIDATE 2 MG/ML IV SOLN: 20.0000 mg | Freq: Once | INTRAVENOUS | Status: AC

## 2024-03-25 MED ORDER — SUCCINYLCHOLINE CHLORIDE 200 MG/10ML IV SOSY
100.0000 mg | PREFILLED_SYRINGE | Freq: Once | INTRAVENOUS | Status: AC
Start: 2024-03-25 — End: 2024-03-25

## 2024-03-25 MED ORDER — HEPARIN SODIUM (PORCINE) 5000 UNIT/ML IJ SOLN
5000.0000 [IU] | Freq: Three times a day (TID) | INTRAMUSCULAR | Status: DC
  Administered 2024-03-26 – 2024-04-07 (×36): 5000 [IU] via SUBCUTANEOUS
  Filled 2024-03-25 (×36): qty 1

## 2024-03-25 MED ORDER — ADULT MULTIVITAMIN W/MINERALS CH
1.0000 | ORAL_TABLET | Freq: Every day | ORAL | Status: DC
  Administered 2024-03-26 – 2024-04-03 (×8): 1
  Filled 2024-03-25 (×10): qty 1

## 2024-03-25 MED ORDER — VITAMIN B-12 1000 MCG PO TABS: 1000.0000 ug | ORAL_TABLET | Freq: Every day | ORAL | Status: DC

## 2024-03-25 MED ORDER — FENTANYL 2500MCG IN NS 250ML (10MCG/ML) PREMIX INFUSION
50.0000 ug/h | INTRAVENOUS | Status: DC
  Administered 2024-03-25: 50 ug/h via INTRAVENOUS
  Filled 2024-03-25: qty 250

## 2024-03-25 MED ORDER — ETOMIDATE 2 MG/ML IV SOLN
INTRAVENOUS | Status: AC
  Administered 2024-03-25: 20 mg via INTRAVENOUS
  Filled 2024-03-25: qty 10

## 2024-03-25 MED ORDER — SODIUM CHLORIDE 0.9 % IV SOLN
500.0000 mg | Freq: Once | INTRAVENOUS | Status: AC
  Administered 2024-03-25: 500 mg via INTRAVENOUS
  Filled 2024-03-25: qty 5

## 2024-03-25 MED ORDER — POLYETHYLENE GLYCOL 3350 17 G PO PACK
17.0000 g | PACK | Freq: Every day | ORAL | Status: DC
  Administered 2024-03-26 – 2024-03-29 (×4): 17 g
  Filled 2024-03-25 (×4): qty 1

## 2024-03-25 NOTE — Progress Notes (Signed)
 Same-day progress note  Patient transferred over to [facility] as teleconsult at [facility] by Dr. Cleone Dad. Unable to get MRI brain imaging or MRA at [facility] was told they have equipment compatibility issues with the ventilator. No active seizures noted on arrival Currently on Versed 1 mg/h-that have discontinued. Also on fentanyl and Precedex No spontaneous movement Pupil equal round reactive to light.  Gaze midline.  Did not blink to threat from either side. Mild withdrawal to noxious stimulation in all fours.  Impression: Toxic metabolic encephalopathy in the setting of hyperglycemia and cocaine abuse as well as possible breakthrough seizure due to the same.  Recommendations: Continue Keppra  1 g twice daily MRI brain without contrast and MRA head without contrast when able to. Check Keppra  level Rest of the recommendations as in Dr. Nicholes Barks consult note   Neurology will follow  Plan discussed with JD Marlys Singh from the PCCM team  -- Tona Francis, MD Neurologist [facility]  25 min CC time

## 2024-03-25 NOTE — ED Notes (Signed)
 Patient taken to CT with this RN, RT, and jamie, RN. VSS.

## 2024-03-25 NOTE — H&P (Signed)
 NAME:  Ricky Werner, MRN:  [id number], DOB:  [birth year], LOS: 0 ADMISSION DATE:  03/25/2024, CONSULTATION DATE:  4/30 REFERRING MD:  Dr. Kermit Ped , CHIEF COMPLAINT:  unresponsive; possible seizure; acute respiratory failure   History of Present Illness:  Patient is a 58 year old male with pertinent PMH alcohol  use with history of alcohol  withdrawal and withdrawal seizure, tobacco/marijuana abuse, HTN, anxiety/depression presents to [facility] ED on 4/30 unresponsive.  Patient has history of alcohol  withdrawal seizures last admitted on 2018.  On Keppra .  Patient lives with son.  Family states he has not been drinking regularly since he has been out of work.  On 4/30 patient LKN an hour and a half ago and son found patient on floor unresponsive.  Patient started CPR.  EMS arrived and patient unresponsive but had a pulse.  Patient given Narcan with no effect.  Patient noted to be bradycardic and placed on supplemental O2.  Transported to [facility] ED.  On arrival BP soft.  GCS 7.  Localizes to pain on right side.  On NRB with sats mid 80s requiring intubation.  Patient required levo for hypotension.  CT head no acute findings.  Neuro consulted recommending MRI.  Gave Keppra  load.  Started on thiamine . Ethanol wnl.  UDS and Keppra  level pending.  CT chest showing peribronchovascular infiltrates versus atelectasis in L>R favoring aspiration. Wbc 15. Cultures obtained and started on rocephin/azithro. Glucose 459, co2 15, AG 21. UA, Beta H, Osm pending. Given insulin.   Pertinent ED Labs: Creat 1.96, AST 64, ALT 83,  Pertinent  Medical History   Past Medical History:  Diagnosis Date   Alcohol  abuse    Anxiety    BPH (benign prostatic hyperplasia)    Carpal tunnel syndrome on right    Chronic back pain    Depression    Headache    History of dental problems    Hypertension    Osteoarthritis    Panic attacks    Right tennis elbow    Sciatica    Seizures (HCC) 09/30/2020   Vertigo      Significant  Hospital Events: Including procedures, antibiotic start and stop dates in addition to other pertinent events   4/30 admitted w/ acute encephalopathy intubated  Interim History / Subjective:  See above  Objective   Blood pressure 95/70, pulse 75, resp. rate 18, SpO2 99%.    Vent Mode: PRVC FiO2 (%):  [100 %] 100 % Set Rate:  [18 bmp] 18 bmp Vt Set:  [510 mL] 510 mL PEEP:  [5 cmH20] 5 cmH20   Intake/Output Summary (Last 24 hours) at 03/25/2024 1927 Last data filed at 03/25/2024 1926 Gross per 24 hour  Intake 1000 ml  Output --  Net 1000 ml   There were no vitals filed for this visit.  Examination: General:  critically ill appearing on mech vent HEENT: MM pink/moist; ETT in place Neuro: sedate; roaming eye mvt and muscle fasciculations but squeezes hand to command CV: s1s2, RRR, no m/r/g PULM:  dim clear BS bilaterally; on mech vent PRVC GI: soft, bsx4 active  Extremities: warm/dry, no edema  Skin: no rashes or lesions    Resolved Hospital Problem list     Assessment & Plan:   Acute encephalopathy Hx of alcohol  abuse and alcohol  withdrawal seizures on keppra  Polysubstance abuse -CT head negative; ethanol wnl -uds positive for cocaine, benzo, amphetamines Plan: -neuro following; appreciate recs -MRI -EEG -Seizure precautions -AEDs per neuro -limit sedating meds -Thiamine , folic acid , MVI -Consider  phenobarbital taper? -sedation for RASS 0 to -1 -Check ammonia -Continue neuroprotective measures- normothermia, euglycemia, HOB greater than 30, head in neutral alignment, normocapnia, normoxia  Acute respiratory failure with hypoxia Possible aspiration Plan: -CXR on arrival -LTVV strategy with tidal volumes of 6-8 cc/kg ideal body weight -check ABG and adjust settings accordingly  -Goal plateau pressures less than 30 and driving pressures less than 15 -Wean PEEP/FiO2 for SpO2 > 92% -VAP bundle in place -Daily SAT and SBT -PAD protocol in place -wean sedation  for RASS goal 0 to -1 - Trach aspirate - Unasyn for possible aspiration  Shock: presumed septic secondary to aspiration PNA Plan: - Unasyn for possible aspiration - Follow cultures - Levo for MAP goal greater than 65 - IV fluids - trend LA  Brief cardiac arrest?: family found unresponsive and started CPR; on EMS arrival patient had pulse.  Question if patient had pulse initially Plan: -trend troponin and lactate -echo -tele monitoring  Hyperglycemia Plan: -beta H wnl -A1c pending -ssi and cbg monitoring  AKI AGMA Plan: -Trend BMP / urinary output -Replace electrolytes as indicated -Avoid nephrotoxic agents, ensure adequate renal perfusion  Mildly elevated LFTs -Likely related to alcohol  abuse Plan: - Trend CMP - Consider RUQ ultrasound and hepatitis panel  Hx of htn Plan: - Hold home meds  Hx of anxiety/depression Plan: - Hold home meds for now - Will eventually need to resume home Xanax at low-dose to avoid withdrawal  Chronic pain Plan: - Hold home Robaxin  for now  [employer] (right click and "Reselect all SmartList Selections" daily)   Diet/type: NPO w/ meds via tube DVT prophylaxis prophylactic heparin  Pressure ulcer(s): N/A GI prophylaxis: H2B Lines: N/A Foley:  Yes, and it is still needed Code Status:  full code Last date of multidisciplinary goals of care discussion [4/30 updated son Veryl Gottron over phone]  Labs   CBC: Recent Labs  Lab 03/25/24 1655 03/25/24 1657  WBC 15.0*  --   NEUTROABS 10.4*  --   HGB 12.8* 13.9  HCT 40.4 41.0  MCV 98.1  --   PLT 275  --     Basic Metabolic Panel: Recent Labs  Lab 03/25/24 1655 03/25/24 1657  NA 135 136  K 4.1 4.2  CL 99 101  CO2 15*  --   GLUCOSE 459* 453*  BUN 23* 25*  CREATININE 1.96* 1.90*  CALCIUM  8.6*  --    GFR: CrCl cannot be calculated (Unknown ideal weight.). Recent Labs  Lab 03/25/24 1655  WBC 15.0*    Liver Function Tests: Recent Labs  Lab 03/25/24 1655   AST 64*  ALT 83*  ALKPHOS 39  BILITOT 0.7  PROT 6.9  ALBUMIN 3.9   No results for input(s): "LIPASE", "AMYLASE" in the last 168 hours. No results for input(s): "AMMONIA" in the last 168 hours.  ABG    Component Value Date/Time   TCO2 19 (L) 03/25/2024 1657     Coagulation Profile: Recent Labs  Lab 03/25/24 1655  INR 1.2    Cardiac Enzymes: No results for input(s): "CKTOTAL", "CKMB", "CKMBINDEX", "TROPONINI" in the last 168 hours.  HbA1C: No results found for: "HGBA1C"  CBG: Recent Labs  Lab 03/25/24 1654  GLUCAP 489*    Review of Systems:   Patient is encephalopathic and/or intubated; therefore, history has been obtained from chart review.    Past Medical History:  He,  has a past medical history of Alcohol  abuse, Anxiety, BPH (benign prostatic hyperplasia), Carpal tunnel syndrome on right,  Chronic back pain, Depression, Headache, History of dental problems, Hypertension, Osteoarthritis, Panic attacks, Right tennis elbow, Sciatica, Seizures (HCC) (09/30/2020), and Vertigo.   Surgical History:   Past Surgical History:  Procedure Laterality Date   KIDNEY STONE SURGERY       Social History:   reports that he has been smoking cigarettes. He has a 39 pack-year smoking history. He has been exposed to tobacco smoke. He has never used smokeless tobacco. He reports that he does not currently use alcohol  after a past usage of about 6.0 standard drinks of alcohol  per week. He reports that he does not currently use drugs after having used the following drugs: Marijuana.   Family History:  His family history includes Breast cancer in his mother; Diabetes in his mother; Heart failure in his mother; Hyperlipidemia in his mother; Hypertension in his brother, father, and mother; Stroke in his father.   Allergies No Known Allergies   Home Medications  Prior to Admission medications   Medication Sig Start Date End Date Taking? Authorizing Provider  alprazolam (XANAX) 2 MG  tablet Take 2 mg by mouth 3 (three) times daily. 11/03/21  Yes [provider]  methocarbamol  (ROBAXIN ) 500 MG tablet Take 1 tablet (500 mg total) by mouth every 8 (eight) hours as needed for muscle spasms. 02/21/23  Yes Edson Graces, MD  naproxen  (NAPROSYN ) 500 MG tablet Take 1 tablet (500 mg total) by mouth 2 (two) times daily. 02/21/23  Yes Edson Graces, MD  amLODipine  (NORVASC ) 10 MG tablet Take 10 mg by mouth daily. 12/01/14   [provider]  aspirin  EC 81 MG tablet Take 81 mg by mouth daily.    [provider]  cloNIDine (CATAPRES) 0.2 MG tablet Take 0.2 mg by mouth 3 (three) times daily. 11/14/21   [provider]  escitalopram (LEXAPRO) 10 MG tablet Take 1 tablet by mouth daily. 04/22/21   [provider]  Hyprom-Naphaz-Polysorb-Zn Sulf (CLEAR EYES COMPLETE OP) Place 1 drop into both eyes daily as needed (dry eyes/irritation).    [provider]  levETIRAcetam  (KEPPRA ) 1000 MG tablet Take 1 tablet (1,000 mg total) by mouth 2 (two) times daily. 03/12/23   Jhonny Moss, MD  olmesartan  (BENICAR ) 20 MG tablet Take 1 tablet (20 mg total) by mouth daily. One twice daily Patient taking differently: Take 20 mg by mouth daily. 07/25/23   Diamond Formica, MD     Critical care time: 45 minutes     [facility] 03/25/2024, 7:28 PM  Please see [email or web address] for pager details.  From 7A-7P if no response, please call [phone]. After hours, please call ELink [phone].

## 2024-03-25 NOTE — Progress Notes (Signed)
 ABG collected and taken to lab.

## 2024-03-25 NOTE — ED Notes (Signed)
 Patient leaving with [facility] at this time. Report given to [facility]. VSS. Vent settings fio2 100%, peep 5, RR 18, TV 510.

## 2024-03-25 NOTE — Progress Notes (Signed)
Patient taken to CT and back without any complications

## 2024-03-25 NOTE — ED Notes (Signed)
EDP at bedside upon pt arrival.  ?

## 2024-03-25 NOTE — ED Provider Notes (Signed)
 [place] EMERGENCY DEPARTMENT AT [facility] Provider Note   CSN: [id number] Arrival date & time: 03/25/24  1648     History  Chief Complaint  Patient presents with   unresponsive    Ricky Werner is a 58 y.o. male.  HPI Patient presents for unresponsiveness.  Medical history includes alcohol  abuse, seizures, BPH, anxiety, depression, HTN, arthritis, benzodiazepine dependence.  He was last seen well an hour half ago.  He lives with his son who went to take a shower.  When his son came out of the shower, patient was on the floor unresponsive.  Patient son initiated CPR on scene.  This was called.  When EMS arrived, patient remained unresponsive.  He did have a pulse.  He was placed on supplemental oxygen.  He was given Narcan with no effect.  He was initially bradycardic but this improved after initiation of supplemental oxygen.  EMS was unable to obtain reliable SpO2 prior to arrival.  On arrival, patient remains on oxygen.  He remains unresponsive.    Home Medications Prior to Admission medications   Medication Sig Start Date End Date Taking? Authorizing Provider  alprazolam (XANAX) 2 MG tablet Take 2 mg by mouth 3 (three) times daily. 11/03/21   [provider]  amLODipine  (NORVASC ) 10 MG tablet Take 10 mg by mouth daily. 12/01/14   [provider]  aspirin  EC 81 MG tablet Take 81 mg by mouth daily.    [provider]  cloNIDine (CATAPRES) 0.2 MG tablet Take 0.2 mg by mouth 3 (three) times daily. 11/14/21   [provider]  escitalopram (LEXAPRO) 10 MG tablet Take 1 tablet by mouth daily. 04/22/21   [provider]  Hyprom-Naphaz-Polysorb-Zn Sulf (CLEAR EYES COMPLETE OP) Place 1 drop into both eyes daily as needed.     [provider]  levETIRAcetam  (KEPPRA ) 1000 MG tablet Take 1 tablet (1,000 mg total) by mouth 2 (two) times daily. 03/12/23   Jhonny Moss, MD  methocarbamol  (ROBAXIN ) 500 MG tablet Take 1 tablet (500  mg total) by mouth every 8 (eight) hours as needed for muscle spasms. 02/21/23   Edson Graces, MD  Multiple Vitamin (MULTIVITAMIN WITH MINERALS) TABS tablet Take 1 tablet by mouth daily. 09/22/17   Johnson, Clanford L, MD  naproxen  (NAPROSYN ) 500 MG tablet Take 1 tablet (500 mg total) by mouth 2 (two) times daily. 02/21/23   Edson Graces, MD  olmesartan  (BENICAR ) 20 MG tablet Take 1 tablet (20 mg total) by mouth daily. One twice daily Patient taking differently: Take 20 mg by mouth daily. 07/25/23   Diamond Formica, MD      Allergies    Patient has no known allergies.    Review of Systems   Review of Systems  Unable to perform ROS: Patient unresponsive    Physical Exam Updated Vital Signs BP 93/64   Pulse 79   Resp 16   SpO2 98%  Physical Exam Vitals and nursing note reviewed.  Constitutional:      General: He is not in acute distress.    Appearance: He is well-developed and normal weight. He is ill-appearing. He is not toxic-appearing or diaphoretic.  HENT:     Head: Normocephalic and atraumatic.     Right Ear: External ear normal.     Left Ear: External ear normal.     Nose: Nose normal.     Mouth/Throat:     Mouth: Mucous membranes are moist.  Comments: Poor dentition Eyes:     Conjunctiva/sclera: Conjunctivae normal.     Comments: Roving eye movements  Cardiovascular:     Rate and Rhythm: Normal rate and regular rhythm.     Heart sounds: No murmur heard. Pulmonary:     Breath sounds: No wheezing or rales.  Abdominal:     General: There is no distension.     Palpations: Abdomen is soft.     Tenderness: There is no abdominal tenderness.  Musculoskeletal:        General: No swelling or deformity.     Cervical back: Neck supple.  Skin:    General: Skin is warm and dry.     Coloration: Skin is not jaundiced or pale.  Neurological:     GCS: GCS eye subscore is 1. GCS verbal subscore is 1. GCS motor subscore is 5.     Comments: Initially, minimal movement on  left side.       ED Results / Procedures / Treatments   Labs (all labs ordered are listed, but only abnormal results are displayed) Labs Reviewed  CBC - Abnormal; Notable for the following components:      Result Value   WBC 15.0 (*)    RBC 4.12 (*)    Hemoglobin 12.8 (*)    All other components within normal limits  DIFFERENTIAL - Abnormal; Notable for the following components:   Neutro Abs 10.4 (*)    Abs Immature Granulocytes 0.80 (*)    All other components within normal limits  COMPREHENSIVE METABOLIC PANEL WITH GFR - Abnormal; Notable for the following components:   CO2 15 (*)    Glucose, Bld 459 (*)    BUN 23 (*)    Creatinine, Ser 1.96 (*)    Calcium  8.6 (*)    AST 64 (*)    ALT 83 (*)    GFR, Estimated 38 (*)    Anion gap 21 (*)    All other components within normal limits  CBG MONITORING, ED - Abnormal; Notable for the following components:   Glucose-Capillary 489 (*)    All other components within normal limits  I-STAT CHEM 8, ED - Abnormal; Notable for the following components:   BUN 25 (*)    Creatinine, Ser 1.90 (*)    Glucose, Bld 453 (*)    Calcium , Ion 1.07 (*)    TCO2 19 (*)    All other components within normal limits  CULTURE, BLOOD (ROUTINE X 2)  CULTURE, BLOOD (ROUTINE X 2)  RESP PANEL BY RT-PCR (RSV, FLU A&B, COVID)  RVPGX2  ETHANOL  PROTIME-INR  APTT  RAPID URINE DRUG SCREEN, HOSP PERFORMED  LEVETIRACETAM  LEVEL  LACTIC ACID, PLASMA  CK  MAGNESIUM  VITAMIN B12  OSMOLALITY  BETA-HYDROXYBUTYRIC ACID  TROPONIN I (HIGH SENSITIVITY)  TROPONIN I (HIGH SENSITIVITY)    EKG EKG Interpretation Date/Time:  Wednesday March 25 2024 16:53:49 EDT Ventricular Rate:  102 PR Interval:  156 QRS Duration:  99 QT Interval:  345 QTC Calculation: 450 R Axis:   66  Text Interpretation: Sinus tachycardia Left atrial enlargement Minimal ST depression, inferior leads Confirmed by Iva Mariner [phone]) on 03/25/2024 5:51:13 PM  Radiology CT Cervical Spine  Wo Contrast Result Date: 03/25/2024 CLINICAL DATA:  Neck trauma.  Patient was found unresponsive by son. EXAM: CT CERVICAL SPINE WITHOUT CONTRAST TECHNIQUE: Multidetector CT imaging of the cervical spine was performed without intravenous contrast. Multiplanar CT image reconstructions were also generated. RADIATION DOSE REDUCTION: This exam was performed  according to the departmental dose-optimization program which includes automated exposure control, adjustment of the mA and/or kV according to patient size and/or use of iterative reconstruction technique. COMPARISON:  None Available. FINDINGS: Alignment: No significant listhesis is present. Normal cervical lordosis is present. Skull base and vertebrae: The craniocervical junction is normal. The vertebral body heights are normal. Soft tissues and spinal canal: No prevertebral fluid or swelling. No visible canal hematoma. Patient is intubated. Atherosclerotic calcifications are present at the carotid bifurcations bilaterally without significant stenosis. Disc levels: Uncovertebral and facet hypertrophy results an left greater than right foraminal narrowing at C3-4 and C5-6. No significant central canal stenosis is present. Upper chest: The lung apices are clear. The thoracic inlet is within normal limits. IMPRESSION: 1. No acute fracture or traumatic subluxation. 2. Uncovertebral and facet hypertrophy results in left greater than right foraminal narrowing at C3-4 and C5-6. 3. Atherosclerotic calcifications at the carotid bifurcations bilaterally without significant stenosis. Electronically Signed   By: Audree Leas M.D.   On: 03/25/2024 18:34   CT CHEST ABDOMEN PELVIS WO CONTRAST Result Date: 03/25/2024 CLINICAL DATA:  Blunt polytrauma, found unresponsive by son EXAM: CT CHEST, ABDOMEN AND PELVIS WITHOUT CONTRAST TECHNIQUE: Multidetector CT imaging of the chest, abdomen and pelvis was performed following the standard protocol without IV contrast. RADIATION  DOSE REDUCTION: This exam was performed according to the departmental dose-optimization program which includes automated exposure control, adjustment of the mA and/or kV according to patient size and/or use of iterative reconstruction technique. COMPARISON:  Chest radiograph earlier today; CT chest 11/29/2023; CT abdomen pelvis 10/19/2018 FINDINGS: CT CHEST FINDINGS Cardiovascular: No pericardial effusion. Coronary artery and aortic atherosclerotic calcification. Evaluation for vascular injury is limited without IV contrast. Mediastinum/Nodes: Endotracheal tube with tip in the intrathoracic trachea. Unremarkable esophagus. No mediastinal hematoma. Lungs/Pleura: Peribronchovascular infiltrates/atelectasis in the left greater than right lower lobes. Peribronchial wall thickening in the lower lobes. No pleural effusion or pneumothorax. Musculoskeletal: No acute fracture. Chronic superior endplate compression fractures of T10 and T11. CT ABDOMEN PELVIS FINDINGS Hepatobiliary: Cholelithiasis. No evidence of acute cholecystitis. Unremarkable noncontrast appearance of the liver. Pancreas: Unremarkable. Spleen: Unremarkable. Adrenals/Urinary Tract: Normal adrenal glands and kidneys. Unremarkable bladder. Stomach/Bowel: No bowel obstruction or bowel wall thickening. Stomach and appendix are within normal limits. Vascular/Lymphatic: Aortic atherosclerosis. No enlarged abdominal or pelvic lymph nodes. Reproductive: Unremarkable. Other: No free intraperitoneal fluid or air. Musculoskeletal: No acute fracture. IMPRESSION: 1. No acute traumatic injury in the chest, abdomen, or pelvis. 2. Peribronchovascular infiltrates/atelectasis in the left greater than right lower lobes. Peribronchial wall thickening in the lower lobes. Findings are favored to represent aspiration and/or bronchopneumonia. 3. Cholelithiasis. 4. Aortic Atherosclerosis (ICD10-I70.0). Electronically Signed   By: Rozell Cornet M.D.   On: 03/25/2024 18:16   CT  HEAD WO CONTRAST ( ) Result Date: 03/25/2024 CLINICAL DATA:  Neuro deficit, acute, stroke suspected. Patient found unresponsive on ground by son. EXAM: CT HEAD WITHOUT CONTRAST TECHNIQUE: Contiguous axial images were obtained from the base of the skull through the vertex without intravenous contrast. RADIATION DOSE REDUCTION: This exam was performed according to the departmental dose-optimization program which includes automated exposure control, adjustment of the mA and/or kV according to patient size and/or use of iterative reconstruction technique. COMPARISON:  MR head without contrast 04/19/2023 FINDINGS: Brain: No acute infarct, hemorrhage, or mass lesion is present. Deep brain nuclei are within normal limits. No significant white matter lesions are present. The ventricles are of normal size. No significant extraaxial fluid collection is present. The brainstem  and cerebellum are within normal limits. Vascular: Minimal atherosclerotic calcifications are present in the cavernous internal carotid arteries bilaterally. No hyperdense vessel is present. Skull: Calvarium is intact. No focal lytic or blastic lesions are present. No significant extracranial soft tissue lesion is present. Sinuses/Orbits: The paranasal sinuses and mastoid air cells are clear. The globes and orbits are within normal limits. IMPRESSION: Negative CT of the head. No acute or focal lesion to explain the patient's symptoms. Electronically Signed   By: Audree Leas M.D.   On: 03/25/2024 18:02   DG Chest Portable 1 View Result Date: 03/25/2024 CLINICAL DATA:  Intubation EXAM: PORTABLE CHEST 1 VIEW COMPARISON:  08/16/2020 FINDINGS: Endotracheal tube placed with tip measuring 3.6 cm above the carina. Shallow inspiration. Heart size and pulmonary vascularity are normal. Lungs are clear. No pleural effusion or pneumothorax. Mediastinal contours appear intact. Old fracture deformity of the left clavicle. IMPRESSION: Endotracheal tube  appears in satisfactory position. Lungs are clear. Electronically Signed   By: Boyce Byes M.D.   On: 03/25/2024 17:25    Procedures Procedure Name: Intubation Date/Time: 03/25/2024 5:17 PM  Performed by: Iva Mariner, MDOxygen Delivery Method: Ambu bag Preoxygenation: Pre-oxygenation with 100% oxygen Induction Type: Rapid sequence Ventilation: Mask ventilation without difficulty Laryngoscope Size: Glidescope and 4 Grade View: Grade I Tube size: 7.5 mm Number of attempts: 1 Airway Equipment and Method: Rigid stylet and Video-laryngoscopy Placement Confirmation: ETT inserted through vocal cords under direct vision, CO2 detector and Breath sounds checked- equal and bilateral Secured at: 25 cm Tube secured with: ETT holder Dental Injury: Teeth and Oropharynx as per pre-operative assessment         Medications Ordered in ED Medications  0.9 %  sodium chloride  infusion (has no administration in time range)  norepinephrine (LEVOPHED) 4mg  in (0.016 mg/mL) premix infusion (7 mcg/min Intravenous Rate/Dose Change 03/25/24 1805)  fentaNYL 10 mcg/ml infusion (  Not Given 03/25/24 1736)  midazolam (VERSED) 100 mg/100 mL (1 mg/mL) premix infusion (1 mg/hr Intravenous Rate/Dose Change 03/25/24 1748)  cefTRIAXone (ROCEPHIN) 1 g in sodium chloride  0.9 % 100 mL IVPB (has no administration in time range)  azithromycin (ZITHROMAX) 500 mg in sodium chloride  0.9 % 250 mL IVPB (has no administration in time range)  cyanocobalamin (VITAMIN B12) tablet 1,000 mcg (has no administration in time range)  levETIRAcetam  (KEPPRA ) IVPB 1000 mg/100 mL premix (has no administration in time range)    Followed by  levETIRAcetam  (KEPPRA ) IVPB 1000 mg/100 mL premix (has no administration in time range)  lactated ringers  bolus 1,000 mL (has no administration in time range)  etomidate (AMIDATE) injection 20 mg (20 mg Intravenous Given 03/25/24 1708)  succinylcholine (ANECTINE) syringe 100 mg (100 mg Intravenous  Given 03/25/24 1709)  lactated ringers  bolus 1,000 mL (1,000 mLs Intravenous New Bag/Given 03/25/24 1742)  insulin aspart (novoLOG) injection 10 Units (10 Units Subcutaneous Given 03/25/24 1734)  midazolam (VERSED) 5 MG/5ML injection 4 mg (4 mg Intravenous Given 03/25/24 1728)    ED Course/ Medical Decision Making/ A&P                                 Medical Decision Making Amount and/or Complexity of Data Reviewed Labs: ordered. Radiology: ordered.  Risk Prescription drug management. Decision regarding hospitalization.   This patient presents to the ED for concern of unresponsive, this involves an extensive number of treatment options, and is a complaint that carries with it a high  risk of complications and morbidity.  The differential diagnosis includes overdose, seizure, CVA, cardiac arrest, anoxic brain injury, aspiration, infection   Co morbidities that complicate the patient evaluation  alcohol  abuse, seizures, BPH, anxiety, depression, HTN, arthritis, benzodiazepine dependence   Additional history obtained:  Additional history obtained from EMS External records from outside source obtained and reviewed including EMR   Lab Tests:  I Ordered, and personally interpreted labs.  The pertinent results include: Hyperglycemia, AKI, and leukocytosis are present.  Further lab work pending at time of admission.   Imaging Studies ordered:  I ordered imaging studies including chest x-ray, CT of head, cervical spine, chest, abdomen, pelvis: MRI-MRA brain I independently visualized and interpreted imaging which showed CT of head showed no acute findings.  On CT of chest, patient does have bibasilar infiltrates, left greater than right consistent with aspiration and/or pneumonia. I agree with the radiologist interpretation   Cardiac Monitoring: / EKG:  The patient was maintained on a cardiac monitor.  I personally viewed and interpreted the cardiac monitored which showed an  underlying rhythm of: Sinus rhythm   Problem List / ED Course / Critical interventions / Medication management  Patient presenting for unresponsiveness.  On arrival, he is GCS 7.  He is on nonrebreather.  SpO2 was in the mid 80s despite nonrebreather.  He does localize to pain on the right side.  Initially, he had minimal movement on the left side.  Code stroke was initiated.  Breathing was assisted with BVM.  Patient's blood pressures were soft and he was started on Levophed.  While preoxygenated with BVM, patient did have improvement in GCS.  He was able to open his eyes to voice and follow commands.  He now does appear to be moving all extremities.  Patient underwent intubation for a protection and failure to adequately oxygenate.  SpO2 improved after intubation.  Patient underwent CT scans.  CT of head did not show acute findings.  I discussed with neurologist on-call, Dr. Cleone Dad, who feels that stroke is unlikely.  She did order MRI studies to further rule it out.  Suspicion is a metabolic encephalopathy, likely secondary to seizure and aspiration.  Keppra  was ordered.  After intubation, he was sedated with Versed.  Of note, he did have flailing movements of all extremities prior to initiation of Versed.  His lab work is notable for hyperglycemia, AKI, leukocytosis.  CT scan of his chest showed bibasilar infiltrates consistent with aspiration and/or pneumonia.  He was started on antibiotics.  IV fluids were ordered for hydration.  Insulin was ordered for hyperglycemia.  On reassessment, patient is adequately sedated.  He remains on 7 mcg of Levophed.  I spoke with intensivist on-call, Dr. Marene Shape, who accepts the patient for admission to ICU. I ordered medication including etomidate and succinylcholine for RSI; Versed for post intubation sedation; IV fluids for hydration; insulin for hyperglycemia; ceftriaxone and azithromycin for pneumonia; Keppra  for seizure prophylaxis Reevaluation of the patient  after these medicines showed that the patient improved I have reviewed the patients home medicines and have made adjustments as needed   Consultations Obtained:  I requested consultation with the neurologist, Dr. Cleone Dad,  and discussed lab and imaging findings as well as pertinent plan - they recommend: MRI to further rule out stroke.  Admission to medical ICU.  Admitting team to reach out to neurology as needed for any further concerns. I requested consultation with the intensivist, Dr. Marene Shape,  and discussed lab and imaging findings as well  as pertinent plan - they recommend: Admission to ICU   Social Determinants of Health:  Lives with son   CRITICAL CARE Performed by: Iva Mariner   Total critical care time: 45 minutes  Critical care time was exclusive of separately billable procedures and treating other patients.  Critical care was necessary to treat or prevent imminent or life-threatening deterioration.  Critical care was time spent personally by me on the following activities: development of treatment plan with patient and/or surrogate as well as nursing, discussions with consultants, evaluation of patient's response to treatment, examination of patient, obtaining history from patient or surrogate, ordering and performing treatments and interventions, ordering and review of laboratory studies, ordering and review of radiographic studies, pulse oximetry and re-evaluation of patient's condition.         Final Clinical Impression(s) / ED Diagnoses Final diagnoses:  Unresponsive  Multifocal pneumonia  Acute respiratory failure with hypoxia (HCC)  AKI (acute kidney injury) [facility])    Rx / DC Orders ED Discharge Orders     None         Iva Mariner, MD 03/25/24 1857

## 2024-03-25 NOTE — Progress Notes (Signed)
 Upon arrival to unit, pt responded to voice with eye opening. Soon after, questionable rhythmic movement in left greater than right extremities with roving eye movement. Evone Hoh, PA at bedside. Verbal order for 2 mg Versed bolus from infusion. 2 mg Versed given at 2251.  At time, pt on 7mg  of Levophed. VO from [facility] [place] to go to 12mg  of Levo peripherally. If sustained requirements above 12mg , contact him for potential CL placement.    2350- Dr. Arora at bedside. Versed infusing at 1mg /hr. Turned off by MD. Dr. Bonnita Buttner aware of hemodynamic instability and inability to obtain MRI until pt stabilizes.

## 2024-03-25 NOTE — ED Notes (Signed)
 Patient back in room from CT. Norepi adjusted as charted while in CT. VSS.

## 2024-03-25 NOTE — Consult Note (Signed)
 [facility] Telemedicine Consult   Requesting Provider: Dr. Iva Mariner Consult Participants: Myself, patient, bedside nursing, [facility] nurse  Location of the provider: [facility] Location of the patient: [facility]   This consult was provided via telemedicine with 2-way video and audio communication. The patient/family was informed that care would be provided in this way and agreed to receive care in this manner.    Chief Complaint: Found unresponsive, reduced respiratory rate  History obtained from patient's sons in the waiting room while patient was being intubated as well as chart review  HPI: This is a 58 year old man with past medical history significant for alcohol  abuse and benzodiazepine dependence, cigarette smoking, hypertension, alcohol  withdrawal seizures as well as concern for unprovoked seizures on Keppra   Family reports that he has not been drinking regularly recently as he has been out of work.  However they did buy him a small bottle of alcohol  last week for his birthday.  He was last seen by his son Larinda Plover at approximately noon, and another son spoke to him at 1:30 PM at which time he seemed normal and actually having a clearer today than normal.  Subsequently his son Larinda Plover found him with his head slumped backwards sitting on the couch at 4:30 PM.  No evidence of trauma, no tongue bite or urinary incontinence noted.  Initially he thought that the patient was just taking a nap but when he tried to wake him up to tell him he was leaving the house he realized that the patient was nonresponsive and "like a rag doll" with depressed respiratory rate.  EMS was activated and code stroke was activated for left-sided weakness noted  Regarding seizure history, when evaluated by Dr. Ty Gales 03/12/2023:  "He reports that in August 2018, he was drinking hard liquor, "just quit" and ended up in the ER 5 days later with DTs. Per notes, he had an alcohol  withdrawal seizure. He  then resumed drinking liquor and in September 2021 had another seizure that was apparently unprovoked and was started on Levetiracetam  500mg  BID. He saw Dr. Joleen Navy and dose was increased to 1000mg  BID. EEG reportedly normal. He reports episodes of passing out with no prior warning, the last time his son found him on the floor. Some time last year, he bit his tongue and broke 2 front teeth from passing out. He seems to always fall to the left. He also reports an incident last year where he was found by the sheriff but he had no recollection how he got there."  He reported he was not driving to Dr. Ty Gales but sons report he does drive short distances though they try to minimize this due his seizure history. Sons do not recall any focal weakness after prior seizure to their knowledge   LKW: 1 PM Thrombolytic given?: No, clinical course not consistent with stroke Checklist of contradindications was reviewed.  Risks, benefits and alternatives were discussed IR Thrombectomy? No, exam not consistent with LVO  Modified Rankin Scale 1 due to intermittent memory issues  Time of teleneurologist evaluation: 5:02 PM  Exam: Vitals:   03/25/24 1716 03/25/24 1719  BP:    Pulse:  96  Resp:  (!) 22  SpO2: (!) 88% 95%   Current vital signs: BP 117/82   Pulse 96   Resp (!) 22   SpO2 95%  Vital signs in last 24 hours: Pulse Rate:  [93-96] 96 (04/30 1719) Resp:  [17-22] 22 (04/30 1719) BP: (117)/(82) 117/82 (04/30 1715) SpO2:  [  87 %-95 %] 95 % (04/30 1719) FiO2 (%):  [100 %] 100 % (04/30 1716)   General: Minimally responsive  Pulmonary: hypoxic on monitor  Cardiac: regular rate and rhythm on monitor   Initially patient observed to have roving eye movements per ED provider with reactive pupils.  Plegic on the left but spontaneously moving the right side.  Actively being bagged to due to hypoxia.  Over the course of my evaluation patient was noted to have increasing movement on the left side.  Was  able to squeeze and let go bilaterally (weaker on the left than the right) but due to persistent hypoxia was intubated.  Per ED report after intubation was subsequently agitated moving all 4 extremities grossly equally  Standard NIH could not be completed due to patient acuity and need for intubation   Imaging Reviewed:   Head CT no acute intracranial process on my review, radiology report pending  Labs reviewed in epic and pertinent values follow:  Basic Metabolic Panel: Recent Labs  Lab 03/25/24 1657  NA 136  K 4.2  CL 101  GLUCOSE 453*  BUN 25*  CREATININE 1.90*   Last 0.7 in 2021   CBC: Recent Labs  Lab 03/25/24 1655 03/25/24 1657  WBC 15.0*  --   NEUTROABS 10.4*  --   HGB 12.8* 13.9  HCT 40.4 41.0  MCV 98.1  --   PLT 275  --     Coagulation Studies: Recent Labs    03/25/24 1655  LABPROT 15.0  INR 1.2       Assessment: Based on rapidly recovering symptoms, history of seizure (though no focality clearly described with prior seizures, there is a note he would tend to "fall to the left"), suspect seizure with clearing post-ictal Todd's more than stroke. Patient also with other significant issues (hypoxia, possible AKI vs. CKD with GFR of 38, hyperglycemia); hyperglycemia in particular can percipitate seizures and focal neurological deficits.   At this time feel that possible hard of CTA head and neck due to iodinated contrast would outweigh benefit with patient per EDP report moving all extremities equally with good strength after intubation.   Impression: - Likely focal seizure with post-ictal Todd's - Potential for alcohol  withdrawal and benzo withdrawal contributing - Lowered seizure threshold secondary to hyperglycemia - Possible missed medications secondary to memory issues - Concern for renal dysfunction (AKI vs. CKD, no recent labs)  - Hypoxia s/p intubation - Concern for underlying memory dysfunction potentially in the setting of polypharmacy /  ethanol abuse  Recommendations:  - One time Keppra  2000 mg dose now, then 1000 mg BID (last reported home dose)  - MRI brain w/o contrast - MRA head w/o - CK, Mg, keppra  level  - B12 level, empiric 1000 mcg daily, may be discontinued if level results > 500 - Agree with empiric thiamine  supplementation - Appreciate glucose management per EDP - Per Dr. Kermit Ped patient pending transfer to [facility] for medical management - If MRI brain or MRA is concerning, or if patient's mental status is not rapidly improving with correction of medical issues or if other neurological questions/concerns arise, please reach out to neurohospitalist on call at [facility]   This patient is receiving care for possible acute neurological changes. There was 50 minutes of care by this provider at the time of service, including time for direct evaluation via telemedicine, review of medical records, imaging studies and discussion of findings with providers, the patient and/or family.   [employer]  MD-PhD [facility] [phone]   If 8pm-8am, please page neurology on call as listed in AMION.  CRITICAL CARE Performed by: Ronnette Coke   Total critical care time: 50 minutes  Critical care time was exclusive of separately billable procedures and treating other patients.  Critical care was necessary to treat or prevent imminent or life-threatening deterioration.  Critical care was time spent personally by me on the following activities: development of treatment plan with patient and/or surrogate as well as nursing, discussions with consultants, evaluation of patient's response to treatment, examination of patient, obtaining history from patient or surrogate, ordering and performing treatments and interventions, ordering and review of laboratory studies, ordering and review of radiographic studies, pulse oximetry and re-evaluation of patient's condition.

## 2024-03-25 NOTE — ED Notes (Signed)
 Attempted to get OG tube inserted without success. Patient bucking the tube.

## 2024-03-25 NOTE — ED Notes (Signed)
 Code stroke

## 2024-03-25 NOTE — Progress Notes (Addendum)
 eLink Physician-Brief Progress Note Patient Name: [facility] DOB: Aug 23, 1966 MRN: [id number]   Date of Service  03/25/2024  HPI/Events of Note  eICU Brief new admit note: From [facility] to here:  4 male with hx of alcohol  abuse, seizures, BPH, anxiety, depression, HTN, arthritis, benzodiazepine dependence found unresponsive by son. CPR done by Son. EMS: had brady pulse, narcan given without effect. Intubated for low GCS, airway protection.     Arrived at [facility] ICU now.ground team notified.  metabolic encephalopathy, Tox + for polysubstance including cocaine. Received Keppra  empirically in [facility] for suspected seizures. MR-brain, angio, eeG.  AHRF.  CT chest consistent with asp pneumonitis. On unasyn , azithro. Type 2 resp failure. On Vent.  AKI.renal dosing, keep MAP > 65.   HTN hx. Elevated troponin without acute ST elevation, delta 44. Mostly type 2 rise. Trend troponin. ECHO.    Data: ABG 7.3/52/80/25  EKG sinus tachy, inferior minimal ST depression. Qtc 450 Mag 1.9 Trop 401, delta 40 LA 3. BHB normal. RSV neg UA neg Tox: positive for amphetamine/benzos/cocaine Cxr images seen. ET in place, mild right basal air space densities. No effusion. Do not have any cardiomegaly.  Camera: In synchrony with vent. PIP < 25(510/04/13/79%) Breathing over vent set rate of 18. On levo at 7. VS stable. Received 2 mg sedation. Pupils not dilated.   eICU Interventions  -on VAP bundle. VTE sq heparin  CBG goals < 180. On ssi. On SUP On MVI, keppra . Sz prevention Watch for withdrawals.       Intervention Category Major Interventions: Respiratory failure - evaluation and management Evaluation Type: New Patient Evaluation  Rexann Catalan 03/25/2024, 11:02 PM  00:27 RN discussion. Camera evaluation done for Bilateral soft wrist restraints request from bed side RN, to prevent self injury and harm while critically ill, altered mentation. Aaron Aas  MAP 47, f/u MAP > 83. Fluctuating. On levophed   11.  LA > 3. - saline bolus 500 ml stat.   Neurology notes reviewed. Continue keppra , get MRI/MRA when possible, stable.   04:14 Patient has only had UOP since being admitted,  Oliguria, on pressor. AKI. From shock. S/p bolus earlier. S/p 1.5 lit  bolus since in ICU.  On LR at 125 ml/hr. Total 3.8 lit so far documented.  - keep MAP > 65. Levo at 8.  - get LA, troponin( not resulted from 2AM. - send AM labs now.  06;28 Troponin up from 44 to 205( 5 AM). -trend troponin-has an order for 7 AM . If still going up, consider Cardiology help. Rise mostly from cocaine/amphetamine induced stress on heart.

## 2024-03-25 NOTE — ED Triage Notes (Signed)
 Patient arrived via EMS from home after being found unresponsive, slumped on the couch snoring by son. Son started CPR, d/c'd upon EMS arrival. 2mg  Narcan given without any improvement. Patient 80%s with EMS, placed on 15L NRB at 92%. On arrival patient snoring and 87% on NRB 15L. Nasal trumpet inserted and patient ambu bagged. Patient has eye opening to sternal rub. Nailbed pressure withdraws from pain but appears to have neglect on left side per Dixon's assessment. Code stroke activated. Keppra  in patients medications.

## 2024-03-26 ENCOUNTER — Inpatient Hospital Stay (HOSPITAL_COMMUNITY)

## 2024-03-26 ENCOUNTER — Encounter (HOSPITAL_COMMUNITY)

## 2024-03-26 DIAGNOSIS — R739 Hyperglycemia, unspecified: Secondary | ICD-10-CM | POA: Diagnosis not present

## 2024-03-26 DIAGNOSIS — G8384 Todd's paralysis (postepileptic): Secondary | ICD-10-CM

## 2024-03-26 DIAGNOSIS — I469 Cardiac arrest, cause unspecified: Secondary | ICD-10-CM

## 2024-03-26 DIAGNOSIS — G928 Other toxic encephalopathy: Secondary | ICD-10-CM

## 2024-03-26 DIAGNOSIS — F1410 Cocaine abuse, uncomplicated: Secondary | ICD-10-CM

## 2024-03-26 DIAGNOSIS — R569 Unspecified convulsions: Secondary | ICD-10-CM | POA: Diagnosis not present

## 2024-03-26 DIAGNOSIS — J9601 Acute respiratory failure with hypoxia: Secondary | ICD-10-CM | POA: Diagnosis not present

## 2024-03-26 DIAGNOSIS — R7401 Elevation of levels of liver transaminase levels: Secondary | ICD-10-CM

## 2024-03-26 DIAGNOSIS — R579 Shock, unspecified: Secondary | ICD-10-CM

## 2024-03-26 DIAGNOSIS — N179 Acute kidney failure, unspecified: Secondary | ICD-10-CM

## 2024-03-26 DIAGNOSIS — G9340 Encephalopathy, unspecified: Secondary | ICD-10-CM | POA: Diagnosis not present

## 2024-03-26 LAB — HEMOGLOBIN A1C
Hgb A1c MFr Bld: 5.4 % (ref 4.8–5.6)
Mean Plasma Glucose: 108.28 mg/dL

## 2024-03-26 LAB — COMPREHENSIVE METABOLIC PANEL WITH GFR
ALT: 59 U/L — ABNORMAL HIGH (ref 0–44)
AST: 48 U/L — ABNORMAL HIGH (ref 15–41)
Albumin: 2.5 g/dL — ABNORMAL LOW (ref 3.5–5.0)
Alkaline Phosphatase: 24 U/L — ABNORMAL LOW (ref 38–126)
Anion gap: 9 (ref 5–15)
BUN: 21 mg/dL — ABNORMAL HIGH (ref 6–20)
CO2: 26 mmol/L (ref 22–32)
Calcium: 7.6 mg/dL — ABNORMAL LOW (ref 8.9–10.3)
Chloride: 107 mmol/L (ref 98–111)
Creatinine, Ser: 1.25 mg/dL — ABNORMAL HIGH (ref 0.61–1.24)
GFR, Estimated: >60 mL/min (ref >60)
Glucose, Bld: 84 mg/dL (ref 70–99)
Potassium: 3.9 mmol/L (ref 3.5–5.1)
Sodium: 142 mmol/L (ref 135–145)
Total Bilirubin: 1 mg/dL (ref 0.0–1.2)
Total Protein: 4.7 g/dL — ABNORMAL LOW (ref 6.5–8.1)

## 2024-03-26 LAB — MRSA NEXT GEN BY PCR, NASAL: MRSA by PCR Next Gen: NOT DETECTED

## 2024-03-26 LAB — BLOOD GAS, ARTERIAL
Acid-Base Excess: 1.8 mmol/L (ref 0.0–2.0)
Acid-base deficit: 3.2 mmol/L — ABNORMAL HIGH (ref 0.0–2.0)
Bicarbonate: 25.5 mmol/L (ref 20.0–28.0)
Bicarbonate: 28.6 mmol/L — ABNORMAL HIGH (ref 20.0–28.0)
O2 Saturation: 94.4 %
O2 Saturation: 97.5 %
Patient temperature: 35.2
Patient temperature: 36.7
pCO2 arterial: 52 mmHg — ABNORMAL HIGH (ref 32–48)
pCO2 arterial: 56 mmHg — ABNORMAL HIGH (ref 32–48)
pH, Arterial: 7.25 — ABNORMAL LOW (ref 7.35–7.45)
pH, Arterial: 7.34 — ABNORMAL LOW (ref 7.35–7.45)
pO2, Arterial: 64 mmHg — ABNORMAL LOW (ref 83–108)
pO2, Arterial: 76 mmHg — ABNORMAL LOW (ref 83–108)

## 2024-03-26 LAB — PHOSPHORUS: Phosphorus: 2.3 mg/dL — ABNORMAL LOW (ref 2.5–4.6)

## 2024-03-26 LAB — TROPONIN I (HIGH SENSITIVITY)
Troponin I (High Sensitivity): 205 ng/L
Troponin I (High Sensitivity): 182 ng/L (ref <18)

## 2024-03-26 LAB — CBC
HCT: 32.2 % — ABNORMAL LOW (ref 39.0–52.0)
Hemoglobin: 10.7 g/dL — ABNORMAL LOW (ref 13.0–17.0)
MCH: 31.8 pg (ref 26.0–34.0)
MCHC: 33.2 g/dL (ref 30.0–36.0)
MCV: 95.5 fL (ref 80.0–100.0)
Platelets: 209 10*3/uL (ref 150–400)
RBC: 3.37 MIL/uL — ABNORMAL LOW (ref 4.22–5.81)
RDW: 13.2 % (ref 11.5–15.5)
WBC: 12.5 10*3/uL — ABNORMAL HIGH (ref 4.0–10.5)
nRBC: 0 % (ref 0.0–0.2)

## 2024-03-26 LAB — ECHOCARDIOGRAM COMPLETE
AR max vel: 2.66 cm2
AV Area VTI: 3.87 cm2
AV Area mean vel: 2.88 cm2
AV Mean grad: 3 mmHg
AV Peak grad: 5.4 mmHg
Ao pk vel: 1.16 m/s
Area-P 1/2: 3.63 cm2
Calc EF: 58.1 %
Height: 66 in
MV VTI: 4.8 cm2
S' Lateral: 2.8 cm
Single Plane A2C EF: 56.4 %
Single Plane A4C EF: 61.9 %
Weight: 3019.42 [oz_av]

## 2024-03-26 LAB — AMMONIA: Ammonia: 34 umol/L (ref 9–35)

## 2024-03-26 LAB — GLUCOSE, CAPILLARY
Glucose-Capillary: 107 mg/dL — ABNORMAL HIGH (ref 70–99)
Glucose-Capillary: 123 mg/dL — ABNORMAL HIGH (ref 70–99)
Glucose-Capillary: 77 mg/dL (ref 70–99)
Glucose-Capillary: 92 mg/dL (ref 70–99)
Glucose-Capillary: 95 mg/dL (ref 70–99)
Glucose-Capillary: 98 mg/dL (ref 70–99)

## 2024-03-26 LAB — LACTIC ACID, PLASMA
Lactic Acid, Venous: 2.5 mmol/L (ref 0.5–1.9)
Lactic Acid, Venous: 4.8 mmol/L (ref 0.5–1.9)

## 2024-03-26 LAB — OSMOLALITY: Osmolality: 302 mosm/kg — ABNORMAL HIGH (ref 275–295)

## 2024-03-26 LAB — HIV ANTIBODY (ROUTINE TESTING W REFLEX): HIV Screen 4th Generation wRfx: NONREACTIVE

## 2024-03-26 LAB — MAGNESIUM: Magnesium: 1.2 mg/dL — ABNORMAL LOW (ref 1.7–2.4)

## 2024-03-26 MED ORDER — POTASSIUM CHLORIDE 20 MEQ PO PACK
40.0000 meq | PACK | Freq: Once | ORAL | Status: AC
  Administered 2024-03-26: 40 meq
  Filled 2024-03-26: qty 2

## 2024-03-26 MED ORDER — ESCITALOPRAM OXALATE 10 MG PO TABS
10.0000 mg | ORAL_TABLET | Freq: Every day | ORAL | Status: DC
Start: 2024-03-26 — End: 2024-03-30
  Administered 2024-03-26 – 2024-03-29 (×4): 10 mg
  Filled 2024-03-26 (×4): qty 1

## 2024-03-26 MED ORDER — LACTATED RINGERS IV BOLUS
1000.0000 mL | Freq: Once | INTRAVENOUS | Status: AC
  Administered 2024-03-26: 1000 mL via INTRAVENOUS

## 2024-03-26 MED ORDER — SODIUM BICARBONATE 8.4 % IV SOLN
100.0000 meq | Freq: Once | INTRAVENOUS | Status: AC
  Administered 2024-03-26: 100 meq via INTRAVENOUS
  Filled 2024-03-26: qty 100
  Filled 2024-03-26: qty 50

## 2024-03-26 MED ORDER — PROSOURCE TF20 ENFIT COMPATIBL EN LIQD
60.0000 mL | Freq: Two times a day (BID) | ENTERAL | Status: DC
  Administered 2024-03-26 – 2024-04-03 (×13): 60 mL
  Filled 2024-03-26 (×14): qty 60

## 2024-03-26 MED ORDER — ORAL CARE MOUTH RINSE: 15.0000 mL | OROMUCOSAL | Status: DC | PRN

## 2024-03-26 MED ORDER — CHLORHEXIDINE GLUCONATE CLOTH 2 % EX PADS
6.0000 | MEDICATED_PAD | Freq: Every day | CUTANEOUS | Status: DC
  Administered 2024-03-26 – 2024-04-06 (×12): 6 via TOPICAL

## 2024-03-26 MED ORDER — SODIUM CHLORIDE 0.9 % IV BOLUS
500.0000 mL | Freq: Once | INTRAVENOUS | Status: AC
  Administered 2024-03-26: 500 mL via INTRAVENOUS

## 2024-03-26 MED ORDER — FENTANYL CITRATE PF 50 MCG/ML IJ SOSY
50.0000 ug | PREFILLED_SYRINGE | INTRAMUSCULAR | Status: DC | PRN
Start: 2024-03-26 — End: 2024-03-30
  Administered 2024-03-27: 200 ug via INTRAVENOUS
  Administered 2024-03-27 – 2024-03-29 (×8): 100 ug via INTRAVENOUS
  Filled 2024-03-26 (×4): qty 2
  Filled 2024-03-26: qty 4
  Filled 2024-03-26 (×2): qty 2
  Filled 2024-03-26: qty 4

## 2024-03-26 MED ORDER — ALPRAZOLAM 0.25 MG PO TABS
2.0000 mg | ORAL_TABLET | Freq: Three times a day (TID) | ORAL | Status: DC
  Administered 2024-03-26 (×3): 2 mg
  Filled 2024-03-26 (×3): qty 8

## 2024-03-26 MED ORDER — CALCIUM GLUCONATE-NACL 2-0.675 GM/100ML-% IV SOLN
2.0000 g | Freq: Once | INTRAVENOUS | Status: AC
  Administered 2024-03-26: 2000 mg via INTRAVENOUS
  Filled 2024-03-26: qty 100

## 2024-03-26 MED ORDER — OSMOLITE 1.5 CAL PO LIQD
1000.0000 mL | ORAL | Status: DC
  Administered 2024-03-26 – 2024-04-03 (×6): 1000 mL
  Filled 2024-03-26 (×2): qty 1000

## 2024-03-26 MED ORDER — POTASSIUM PHOSPHATES 15 MMOLE/5ML IV SOLN
30.0000 mmol | Freq: Once | INTRAVENOUS | Status: AC
  Administered 2024-03-26: 30 mmol via INTRAVENOUS
  Filled 2024-03-26: qty 10

## 2024-03-26 MED ORDER — MAGNESIUM SULFATE 4 GM/100ML IV SOLN
4.0000 g | Freq: Once | INTRAVENOUS | Status: AC
  Administered 2024-03-26: 4 g via INTRAVENOUS
  Filled 2024-03-26: qty 100

## 2024-03-26 MED ORDER — POLYETHYLENE GLYCOL 3350 17 G PO PACK: 17.0000 g | PACK | Freq: Every day | ORAL | Status: DC | PRN

## 2024-03-26 MED ORDER — VITAMIN B-12 1000 MCG PO TABS
1000.0000 ug | ORAL_TABLET | Freq: Every day | ORAL | Status: DC
  Administered 2024-03-26 – 2024-03-30 (×5): 1000 ug
  Filled 2024-03-26 (×5): qty 1

## 2024-03-26 MED ORDER — FENTANYL CITRATE PF 50 MCG/ML IJ SOSY
50.0000 ug | PREFILLED_SYRINGE | INTRAMUSCULAR | Status: DC | PRN
  Administered 2024-03-26 – 2024-03-28 (×2): 50 ug via INTRAVENOUS
  Filled 2024-03-26 (×2): qty 1

## 2024-03-26 MED ORDER — ORAL CARE MOUTH RINSE
15.0000 mL | OROMUCOSAL | Status: DC
  Administered 2024-03-26 – 2024-03-30 (×52): 15 mL via OROMUCOSAL

## 2024-03-26 MED ORDER — MAGNESIUM SULFATE 2 GM/50ML IV SOLN
2.0000 g | Freq: Once | INTRAVENOUS | Status: AC
  Administered 2024-03-26: 2 g via INTRAVENOUS
  Filled 2024-03-26: qty 50

## 2024-03-26 NOTE — Progress Notes (Signed)
 Initial Nutrition Assessment  DOCUMENTATION CODES:   Not applicable  INTERVENTION:   Initiate tube feeding via OG tube: Osmolite 1.5 at 20 ml/h and increase by 10 ml every 8 hours to goal rate of 50 ml/hr (1200 ml per day)  Prosource TF20 60 ml BID  Provides 1960 kcal, 115 gm protein, 912 ml free water  daily  Continue MVI with minerals/thiamine /folic acid    Monitor magnesium  and phosphorus daily, MD to replete as needed, as pt is at risk for refeeding syndrome given hx of polysubstance abuse.   NUTRITION DIAGNOSIS:   Inadequate oral intake related to inability to eat as evidenced by NPO status.  GOAL:   Patient will meet greater than or equal to 90% of their needs  MONITOR:   TF tolerance  REASON FOR ASSESSMENT:   Consult Enteral/tube feeding initiation and management  ASSESSMENT:   Pt with PMH of alcohol  use with alcohol  withdrawal and withdrawal seizure, tobacco/marijuana abuse, HTN, anxiety/depression who lives with his son who reports decreased regular drinking now that pt out of a job admitted with acute encephalopathy due to polysubstance use.    Pt discussed during ICU rounds and with RN and MD.  Pt intubated, no family present during visit.  Per MD too hypoxic for extubation today.   Medications reviewed and include: 1000 mcg vitamin B12, colace BID, pepcid , folic acid , MVI with minerals daily, miralax , thiamine   Precedex   LR @ 125 ml/hr 2 mg mag sulfate Levophed  @ 5 mcg  Labs reviewed:  Mag 1.2 Vitamin B12 379 A1C 5.4 CBG's: 77-98   OG tube: per xray tip in gastric body  NUTRITION - FOCUSED PHYSICAL EXAM:  Flowsheet Row Most Recent Value  Orbital Region No depletion  Upper Arm Region No depletion  Thoracic and Lumbar Region No depletion  Buccal Region Unable to assess  Temple Region No depletion  Clavicle Bone Region No depletion  Clavicle and Acromion Bone Region No depletion  Scapular Bone Region Unable to assess  Dorsal Hand No  depletion  Patellar Region No depletion  Anterior Thigh Region No depletion  Posterior Calf Region No depletion  Edema (RD Assessment) Mild  Hair Reviewed  Eyes Unable to assess  Mouth Unable to assess  Skin Reviewed  Nails Reviewed       Diet Order:   Diet Order             Diet NPO time specified  Diet effective now                   EDUCATION NEEDS:   Not appropriate for education at this time  Skin:  Skin Assessment: Reviewed RN Assessment  Last BM:  unknown  Height:   Ht Readings from Last 1 Encounters:  03/25/24 5\' 6"  (1.676 m)    Weight:   Wt Readings from Last 1 Encounters:  03/26/24 85.6 kg    BMI:  Body mass index is 30.46 kg/m.  Estimated Nutritional Needs:   Kcal:  1900-2200  Protein:  105-120 grams  Fluid:  >2L/day  Randine Butcher., RD, LDN, CNSC See AMiON for contact information

## 2024-03-26 NOTE — Progress Notes (Addendum)
 Patient with persistent labile pressures ranging from 70s systolic to 110s systolic. Dr. Bonnita Buttner notified that RN did not feel comfortable taking patient to MRI at this time considering patient's hemodynamic instability throughout the night and agitation with stimulation.   Dr. Arora okay with waiting to obtain MRI until later today.

## 2024-03-26 NOTE — Progress Notes (Signed)
 NAME:  [facility], MRN:  [id number], DOB:  October 06, 1966, LOS: 1 ADMISSION DATE:  03/25/2024, CONSULTATION DATE:  4/30 REFERRING MD:  Dr. Kermit Ped , CHIEF COMPLAINT:  unresponsive; possible seizure; acute respiratory failure   History of Present Illness:  Patient is a 58 year old male with pertinent PMH alcohol  use with history of alcohol  withdrawal and withdrawal seizure, tobacco/marijuana abuse, HTN, anxiety/depression presents to [facility] ED on 4/30 unresponsive.  Patient has history of alcohol  withdrawal seizures last admitted on 2018.  On Keppra .  Patient lives with son.  Family states he has not been drinking regularly since he has been out of work.  On 4/30 patient LKN an hour and a half ago and son found patient on floor unresponsive.  Patient started CPR.  EMS arrived and patient unresponsive but had a pulse.  Patient given Narcan with no effect.  Patient noted to be bradycardic and placed on supplemental O2.  Transported to [facility] ED.  On arrival BP soft.  GCS 7.  Localizes to pain on right side.  On NRB with sats mid 80s requiring intubation.  Patient required levo for hypotension.  CT head no acute findings.  Neuro consulted recommending MRI.  Gave Keppra  load.  Started on thiamine . Ethanol wnl.  UDS and Keppra  level pending.  CT chest showing peribronchovascular infiltrates versus atelectasis in L>R favoring aspiration. Wbc 15. Cultures obtained and started on rocephin /azithro. Glucose 459, co2 15, AG 21. UA, Beta H, Osm pending. Given insulin .   Pertinent ED Labs: Creat 1.96, AST 64, ALT 83,  Pertinent  Medical History   Past Medical History:  Diagnosis Date   Alcohol  abuse    Anxiety    BPH (benign prostatic hyperplasia)    Carpal tunnel syndrome on right    Chronic back pain    Depression    Headache    History of dental problems    Hypertension    Osteoarthritis    Panic attacks    Right tennis elbow    Sciatica    Seizures (HCC) 09/30/2020   Vertigo      Significant  Hospital Events: Including procedures, antibiotic start and stop dates in addition to other pertinent events   4/30 admitted w/ acute encephalopathy intubated 5/1 weaning sedation. Decr FiO2 to 60%. MRI/A  Interim History / Subjective:  I decr FiO2 to 60%  On 9 NE   Objective   Blood pressure 121/65, pulse 65, temperature 98.8 F (37.1 C), temperature source Axillary, resp. rate (!) 24, height 5\' 6"  (1.676 m), weight 85.6 kg, SpO2 97%.    Vent Mode: PRVC FiO2 (%):  [60 %-100 %] 60 % Set Rate:  [18 bmp-24 bmp] 24 bmp Vt Set:  [510 mL] 510 mL PEEP:  [5 cmH20-10 cmH20] 10 cmH20 Plateau Pressure:  [18 cmH20-26 cmH20] 21 cmH20   Intake/Output Summary (Last 24 hours) at 03/26/2024 1044 Last data filed at 03/26/2024 1000 Gross per 24 hour  Intake 6648.55 ml  Output 925 ml  Net 5723.55 ml   Filed Weights   03/25/24 2000 03/25/24 2300 03/26/24 0500  Weight: 81.3 kg 85.6 kg 85.6 kg    Examination: General:  Critically ill M intubated lightly sedated  HEENT: NCAT pink mm ETT secure EEG in place  Neuro: lightly sedated. Awakens and following commands.  CV: rr s1s2 cap refill < 3 sec  PULM:  Clear but w decr bibasilar sounds. Mechanically ventilated. Tan secretions  GI: soft round ndnt  Extremities: no obvious acute joint deformity. BUE  soft restraints and mittens Skin: c/d/w    Resolved Hospital Problem list     Assessment & Plan:   Possible OOH arrest vs decr LOC in setting of below -family did CPR when they found pt unresponsive. Did have a pulse on EMS arrival  P -supportive care -ECHO 5/1  Acute toxic metabolic encephalopathy Polysubstance abuse (cocaine amphetamines + BZD which is Rx) EtOH abuse  and hx etoh withdrawal sz Possible focal sz w post-ictal Todd's paralysis  P -MRI/MRA ordered -cEEG -sz precautions  -BID keppra   -empiric 1000 mcg B12 + 1mg  folic acid   -wean sedation as able. Versed  gtt dc. Fent gtt changed for PRN fent. Cont precedex . Adding home Xanax   per tube  -does current need his BUE soft restraints. De-escalate as able.   Acute respiratory failure with hypoxia Aspiration PNA vs CAP (mod GPC and few GNR)  Small pleural effusions  Possible chronic upper airway obstruction (see Dr. Waymond Hailey notes)  Plan: -unasyn  -cont MV support -VAP, pulm hygiene -WUA/SBT  -RASS goal 0 to -1  -has been referred to ENT output  Septic shock vs sedation related hypotension  Plan: -unasyn  as above -periph NE for MAP > 65 -- order has been reconciled -follow Bcx   AKI Hypomagnesemia Borderline hypokalemia  Hypocalcemia  Elevated CK  Plan: -replace mag K Ca  -CMP in AM  -CK in AM  Elevated LFTs  -Likely related to alcohol  abuse Plan: -follow LFTs PRN  Lactic acidosis -downtrending -follow PRN   Hx HTN Plan: - holding home meds, on periph pressors   Hx of anxiety/depression Plan: -resume home xanax , lexapro  5/1   Chronic pain Plan: - holding home robaxin    Inadequate PO intake Plan: -EN per RDN. Think FiO2 will be too high for SBT/extubation trial 5/1   L/t/d -order for dc foley 5/1 -depending on pressor req may end up needing central access  -ETT as above   Best Practice (right click and "Reselect all SmartList Selections" daily)   Diet/type: NPO w/ meds via tube DVT prophylaxis prophylactic heparin   Pressure ulcer(s): N/A GI prophylaxis: H2B Lines: N/A Foley:  Yes, and it is no longer needed and removal ordered  Code Status:  full code Last date of multidisciplinary goals of care discussion [4/30 updated son Veryl Gottron over phone]  Labs   CBC: Recent Labs  Lab 03/25/24 1655 03/25/24 1657 03/26/24 0507  WBC 15.0*  --  12.5*  NEUTROABS 10.4*  --   --   HGB 12.8* 13.9 10.7*  HCT 40.4 41.0 32.2*  MCV 98.1  --  95.5  PLT 275  --  209    Basic Metabolic Panel: Recent Labs  Lab 03/25/24 1655 03/25/24 1657 03/25/24 1929 03/26/24 0507  NA 135 136  --  142  K 4.1 4.2  --  3.9  CL 99 101  --  107   CO2 15*  --   --  26  GLUCOSE 459* 453*  --  84  BUN 23* 25*  --  21*  CREATININE 1.96* 1.90*  --  1.25*  CALCIUM  8.6*  --   --  7.6*  MG  --   --  1.9 1.2*   GFR: Estimated Creatinine Clearance: 64.4 mL/min (A) (by C-G formula based on SCr of 1.25 mg/dL (H)). Recent Labs  Lab 03/25/24 1655 03/25/24 1929 03/26/24 0507 03/26/24 0844  WBC 15.0*  --  12.5*  --   LATICACIDVEN  --  3.0* 2.5* 4.8*    Liver Function  Tests: Recent Labs  Lab 03/25/24 1655 03/26/24 0507  AST 64* 48*  ALT 83* 59*  ALKPHOS 39 24*  BILITOT 0.7 1.0  PROT 6.9 4.7*  ALBUMIN 3.9 2.5*   No results for input(s): "LIPASE", "AMYLASE" in the last 168 hours. Recent Labs  Lab 03/25/24 2327  AMMONIA 34    ABG    Component Value Date/Time   PHART 7.34 (L) 03/26/2024 0304   PCO2ART 52 (H) 03/26/2024 0304   PO2ART 76 (L) 03/26/2024 0304   HCO3 28.6 (H) 03/26/2024 0304   TCO2 19 (L) 03/25/2024 1657   ACIDBASEDEF 3.2 (H) 03/25/2024 2330   O2SAT 97.5 03/26/2024 0304     Coagulation Profile: Recent Labs  Lab 03/25/24 1655  INR 1.2    Cardiac Enzymes: Recent Labs  Lab 03/25/24 1929  CKTOTAL 401*    HbA1C: No results found for: "HGBA1C"  CBG: Recent Labs  Lab 03/25/24 1654 03/25/24 2108 03/25/24 2325 03/26/24 0329 03/26/24 0725  GLUCAP 489* 195* 73 77 95   CRITICAL CARE Performed by: Delories Fetter   Total critical care time: 43 minutes  Critical care time was exclusive of separately billable procedures and treating other patients. Critical care was necessary to treat or prevent imminent or life-threatening deterioration.  Critical care was time spent personally by me on the following activities: development of treatment plan with patient and/or surrogate as well as nursing, discussions with consultants, evaluation of patient's response to treatment, examination of patient, obtaining history from patient or surrogate, ordering and performing treatments and interventions, ordering  and review of laboratory studies, ordering and review of radiographic studies, pulse oximetry and re-evaluation of patient's condition.  Eston Hence MSN, AGACNP-BC [facility] Amion for pager  03/26/2024, 10:44 AM

## 2024-03-26 NOTE — Progress Notes (Signed)
 ABG collected and sent to the lab, lab notified.

## 2024-03-26 NOTE — Progress Notes (Signed)
 LTM EEG hooked up and running - no initial skin breakdown - push button tested - [facility] monitoring. MRI safe leads used. RN educated on push button and MRI leads are safe for MRI.

## 2024-03-26 NOTE — Progress Notes (Signed)
 LTM maint complete - no skin breakdown seen. [facility] monitored, Event button test confirmed by [facility].

## 2024-03-26 NOTE — Progress Notes (Signed)
Pt transported to and from MRI on the ventilator without incident. 

## 2024-03-26 NOTE — Progress Notes (Addendum)
 NEUROLOGY CONSULT FOLLOW UP NOTE   Date of service: Mar 26, 2024 Patient Name: Ricky Werner MRN:  [id number] DOB:  Nov 19, 1966  Brief review of HPI: Patient transferred over to [facility] yesterday night/ Seen as teleconsult at [facility] by Dr. Cleone Dad. Unable to get MRI brain imaging or MRA at [facility] as they have equipment compatibility issues with the ventilator. No active seizures noted on arrival. Was on Versed  1 mg/h which was discontinued on arrival.  Also was on fentanyl  and Precedex . No spontaneous movement at time of Dr. Margarite Shearer exam at 11:54 PM. Pupils were equal round reactive to light. Gaze was midline, did not blink to threat from either side. There was mild withdrawal to noxious stimulation in all fours.  Regarding seizure history, when evaluated by Dr. Ty Gales 03/12/2023: "He reports that in August 2018, he was drinking hard liquor, "just quit" and ended up in the ER 5 days later with DTs. Per notes, he had an alcohol  withdrawal seizure. He then resumed drinking liquor and in September 2021 had another seizure that was apparently unprovoked and was started on Levetiracetam  500mg  BID. He saw Dr. Joleen Navy and dose was increased to 1000mg  BID. EEG reportedly normal. He reports episodes of passing out with no prior warning, the last time his son found him on the floor. Some time last year, he bit his tongue and broke 2 front teeth from passing out. He seems to always fall to the left. He also reports an incident last year where he was found by the sheriff but he had no recollection how he got there."  Interval Hx/subjective    Overnight, placed on pressors with labile BP.  MRI completed this AM shows findings concerning for opoid overdose related changes.  Patient moving all extremities, intermittently following commands. Sedated with Fentanyl  PRN, precedex  recently stopped.   Vitals   Vitals:   03/26/24 0630 03/26/24 0645 03/26/24 0700 03/26/24 0729  BP: 96/64  102/64 102/68   Pulse: 80 83 81   Resp: (!) 24 (!) 24 (!) 21 (!) 24  Temp:      TempSrc:      SpO2: 97% 98% 99%   Weight:      Height:         Body mass index is 30.46 kg/m.  Physical Exam   Constitutional: Appears acutely ill HENT: ETT in place Head: Normocephalic.  Cardiovascular: Requiring pressor support for BP  Respiratory: ventilated, on SBT. GI: Soft.  No distension. There is no tenderness.  Skin: WDI.   Neurologic Examination   Patient is intubated and sedated. Follows simple commands. Does not open eyes to voice or noxious stimuli.  With forced eye opening, no nystagmus. Does not track examiner. Dolls eyes.  Unable to test facial symmetry due to presence of ETT.  Midline tongue protrusion. Cough/gag reflex present.  Spontaneous movement of all extremities.  Withdrawal to pain in all extremities.   Medications  Current Facility-Administered Medications:    0.9 %  sodium chloride  infusion, 250 mL, Intravenous, Continuous, Madelynn Schilder, [facility], Held at 03/25/24 1927   Ampicillin -Sulbactam (UNASYN ) 3 g in sodium chloride  0.9 % 100 mL IVPB, 3 g, Intravenous, Q6H, Ananias Balls, RPH, Last Rate: 200 mL/hr at 03/26/24 0700, Infusion Verify at 03/26/24 0700   calcium  gluconate 2 g/ 100 mL sodium chloride  IVPB, 2 g, Intravenous, Once, Bowser, Darin Edinger, NP, Last Rate: 100 mL/hr at 03/26/24 0928, 2,000 mg at 03/26/24 4098   Chlorhexidine  Gluconate Cloth  2 % PADS 6 each, 6 each, Topical, Q2200, Casimiro Cleaves, PA-C, 6 each at 03/26/24 0017   cyanocobalamin  (VITAMIN B12) tablet 1,000 mcg, 1,000 mcg, Per Tube, Daily, Oralee Billow I, RPH, 1,000 mcg at 03/26/24 4098   dexmedetomidine  (PRECEDEX ) 400 MCG/100ML (4 mcg/mL) infusion, 0-1.2 mcg/kg/hr, Intravenous, Continuous, Casimiro Cleaves, PA-C, Last Rate: 8.13 mL/hr at 03/26/24 0936, 0.4 mcg/kg/hr at 03/26/24 0936   docusate (COLACE) 50 MG/5ML liquid 100 mg, 100 mg, Per Tube, BID, Casimiro Cleaves, PA-C, 100 mg at 03/26/24  1191   famotidine  (PEPCID ) tablet 20 mg, 20 mg, Per Tube, BID, Casimiro Cleaves, PA-C, 20 mg at 03/26/24 4782   fentaNYL  (SUBLIMAZE ) injection 50 mcg, 50 mcg, Intravenous, Q15 min PRN, Bowser, Darin Edinger, NP   fentaNYL  (SUBLIMAZE ) injection 50-200 mcg, 50-200 mcg, Intravenous, Q30 min PRN, Bowser, Darin Edinger, NP   folic acid  (FOLVITE ) tablet 1 mg, 1 mg, Per Tube, Daily, Dannie Duval D, PA-C, 1 mg at 03/26/24 9562   heparin  injection 5,000 Units, 5,000 Units, Subcutaneous, Q8H, Marlys Singh, John D, PA-C   insulin  aspart (novoLOG ) injection 0-9 Units, 0-9 Units, Subcutaneous, Q4H, Dannie Duval D, PA-C   lactated ringers  infusion, , Intravenous, Continuous, [name], John D, PA-C, Last Rate: 125 mL/hr at 03/26/24 0726, New Bag at 03/26/24 0726   [COMPLETED] levETIRAcetam  (KEPPRA ) IVPB 1000 mg/100 mL premix, 2,000 mg, Intravenous, Once, Stopped at 03/25/24 1941 **FOLLOWED BY** levETIRAcetam  (KEPPRA ) IVPB 1000 mg/100 mL premix, 1,000 mg, Intravenous, Q12H, [name], RPH, Stopped at 03/26/24 1308   magnesium  sulfate IVPB 4 g 100 mL, 4 g, Intravenous, Once **FOLLOWED BY** magnesium  sulfate IVPB 2 g 50 mL, 2 g, Intravenous, Once, Oralee Billow I, RPH   multivitamin with minerals tablet 1 tablet, 1 tablet, Per Tube, Daily, Casimiro Cleaves, PA-C, 1 tablet at 03/26/24 6578   norepinephrine  (LEVOPHED ) 4mg  in (0.016 mg/mL) premix infusion, 2-10 mcg/min, Intravenous, Titrated, Bowser, Darin Edinger, NP, Last Rate: 33.8 mL/hr at 03/26/24 0924, 9 mcg/min at 03/26/24 0924   Oral care mouth rinse, 15 mL, Mouth Rinse, Q2H, Casimiro Cleaves, PA-C, 15 mL at 03/26/24 4696   Oral care mouth rinse, 15 mL, Mouth Rinse, PRN, Casimiro Cleaves, PA-C   polyethylene glycol (MIRALAX  / GLYCOLAX ) packet 17 g, 17 g, Per Tube, Daily, Casimiro Cleaves, PA-C, 17 g at 03/26/24 2952   polyethylene glycol (MIRALAX  / GLYCOLAX ) packet 17 g, 17 g, Per Tube, Daily PRN, Oralee Billow I, RPH   thiamine  (VITAMIN B1) injection 100 mg, 100 mg, Intravenous,  Daily, Casimiro Cleaves, PA-C, 100 mg at 03/26/24 8413  Labs and Diagnostic Imaging   CBC:  Recent Labs  Lab 03/25/24 1655 03/25/24 1657 03/26/24 0507  WBC 15.0*  --  12.5*  NEUTROABS 10.4*  --   --   HGB 12.8* 13.9 10.7*  HCT 40.4 41.0 32.2*  MCV 98.1  --  95.5  PLT 275  --  209    Basic Metabolic Panel:  Lab Results  Component Value Date   NA 142 03/26/2024   K 3.9 03/26/2024   CO2 26 03/26/2024   GLUCOSE 84 03/26/2024   BUN 21 (H) 03/26/2024   CREATININE 1.25 (H) 03/26/2024   CALCIUM  7.6 (L) 03/26/2024   GFRNONAA >60 03/26/2024   GFRAA >60 08/16/2020   Lipid Panel: No results found for: "LDLCALC" HgbA1c: No results found for: "HGBA1C" Urine Drug Screen:     Component Value Date/Time   LABOPIA NONE DETECTED 03/25/2024 2143  COCAINSCRNUR POSITIVE (A) 03/25/2024 2143   LABBENZ POSITIVE (A) 03/25/2024 2143   AMPHETMU POSITIVE (A) 03/25/2024 2143   THCU NONE DETECTED 03/25/2024 2143   LABBARB NONE DETECTED 03/25/2024 2143    Alcohol  Level     Component Value Date/Time   ETH <15 03/25/2024 1655   INR  Lab Results  Component Value Date   INR 1.2 03/25/2024   APTT  Lab Results  Component Value Date   APTT 24 03/25/2024     Assessment  58 year old man with past medical history significant for alcohol  abuse and benzodiazepine dependence, cigarette smoking, hypertension, alcohol  withdrawal seizures as well as concern for unprovoked seizures on Keppra . Son found him yesterday (4/30) with his head slumped backwards, sitting on the couch at 4:30 PM.  No evidence of trauma, no tongue bite or urinary incontinence noted.  Initially son thought that the patient was just taking a nap but when he tried to wake him up to tell him he was leaving the house he realized that the patient was nonresponsive and "like a rag doll" with depressed respiratory rate. He was brought to the ED where he was evaluated as a Code Stroke by Dr. Cleone Dad via Teleneurology. Initially patient  observed to have roving eye movements per ED provider with reactive pupils; was plegic on the left but spontaneously moving the right side.  He was actively being bagged to due to hypoxia. Over the course of the Teleneurology evaluation patient was noted to have increasing movement on the left side; was able to squeeze and let go bilaterally (weaker on the left than the right) but due to persistent hypoxia was intubated. Per ED report after intubation was subsequently agitated moving all 4 extremities grossly equally. Per Teleneurology note, it was felt that his presentation was most likely due to a focal seizure with post-ictal Todd's paralysis. He was loaded with Keppra  2000 mg at OSH prior to transfer to [facility].  - Exam today: sedation being weaned, patient moving all extremities and following commands. Does not open eyes.  - MRI shows concern for opoid-overdose related changes (CHANTER). MRA negative. Not on opioids at home.  - LTM EEG placed this AM:  - Impressions:  - Acute presentation most likely secondary to focal seizure with post-ictal Todd's paralysis.  - Toxic metabolic encephalopathy in the setting of hyperglycemia and cocaine abuse as well as possible breakthrough seizure due to the same. - Potential for alcohol  withdrawal and benzo withdrawal as contributing factors. Lowered seizure threshold secondary to hyperglycemia. Possible missed medications secondary to memory issues - Other comorbidities: Concern for renal dysfunction (AKI vs. CKD, no recent labs) . Hypoxia s/p intubation Concern for underlying memory dysfunction potentially in the setting of polypharmacy / ethanol abuse    Recommendations  - Continue LTM EEG - Continue to wean sedation - Continue Keppra  1 g twice daily - Continue electrolyte replacements as you are - B12 level, empiric 1000 mcg daily, may be discontinued if level results > 500 - Agree with empiric thiamine  supplementation - Ok from neurological standpoint for  extubation tomorrow.   35 minutes spent in the neurological evaluation and management of this critically ill patient ____________________________________________________________________   Hope Ly, Ambra Haverstick, MD [facility]

## 2024-03-26 NOTE — Care Management (Signed)
 Transition of Care [facility]) - Inpatient Brief Assessment   Patient Details  Name: Ricky Werner MRN: [id number] Date of Birth: [birth year]  Transition of Care [facility]) CM/SW Contact:    Ronni Colace, RN Phone Number: 03/26/2024, 9:16 AM   Clinical Narrative:  58 year old patient found unresponsive by son Patient has history of ETOH, however has not been drinking as much lately, lost job less money. SDOH survey not complete yet. He was positive for amphetamines, cocaine and benzos. Pattern commiserate with metabolic encephalopathy, intubated.at present for airway protection.   TOC will follow the patient for needs, recommendations, and transitions of care  Transition of Care Asessment: Insurance and Status: Insurance coverage has been reviewed Patient has primary care physician: Yes   Prior level of function:: independent Prior/Current Home Services: No current home services Social Drivers of Health Review: SDOH reviewed no interventions necessary (questions not asked yet pt intubated) Readmission risk has been reviewed: Yes Transition of care needs: transition of care needs identified, TOC will continue to follow

## 2024-03-26 NOTE — Progress Notes (Signed)
  Echocardiogram 2D Echocardiogram has been performed.  Suleyma Wafer L Doloras [place] RDCS 03/26/2024, 1:42 PM

## 2024-03-26 NOTE — Progress Notes (Signed)
 Wasted 70 mL of Versed  100 mg/100 mL with Juluis Ok RN. Medication was dispensed at [facility] and he is now at [facility] unable to waste in our Pyxis.

## 2024-03-27 ENCOUNTER — Inpatient Hospital Stay (HOSPITAL_COMMUNITY)

## 2024-03-27 DIAGNOSIS — J9601 Acute respiratory failure with hypoxia: Secondary | ICD-10-CM | POA: Diagnosis not present

## 2024-03-27 DIAGNOSIS — R739 Hyperglycemia, unspecified: Secondary | ICD-10-CM | POA: Diagnosis not present

## 2024-03-27 DIAGNOSIS — R001 Bradycardia, unspecified: Secondary | ICD-10-CM | POA: Diagnosis not present

## 2024-03-27 DIAGNOSIS — J90 Pleural effusion, not elsewhere classified: Secondary | ICD-10-CM

## 2024-03-27 DIAGNOSIS — R569 Unspecified convulsions: Secondary | ICD-10-CM

## 2024-03-27 DIAGNOSIS — G928 Other toxic encephalopathy: Secondary | ICD-10-CM | POA: Diagnosis not present

## 2024-03-27 DIAGNOSIS — G8384 Todd's paralysis (postepileptic): Secondary | ICD-10-CM | POA: Diagnosis not present

## 2024-03-27 DIAGNOSIS — J189 Pneumonia, unspecified organism: Secondary | ICD-10-CM

## 2024-03-27 LAB — GLUCOSE, CAPILLARY
Glucose-Capillary: 121 mg/dL — ABNORMAL HIGH (ref 70–99)
Glucose-Capillary: 147 mg/dL — ABNORMAL HIGH (ref 70–99)
Glucose-Capillary: 149 mg/dL — ABNORMAL HIGH (ref 70–99)
Glucose-Capillary: 127 mg/dL — ABNORMAL HIGH (ref 70–99)

## 2024-03-27 LAB — COMPREHENSIVE METABOLIC PANEL WITH GFR
ALT: 48 U/L — ABNORMAL HIGH (ref 0–44)
AST: 33 U/L (ref 15–41)
Albumin: 2.5 g/dL — ABNORMAL LOW (ref 3.5–5.0)
Alkaline Phosphatase: 23 U/L — ABNORMAL LOW (ref 38–126)
Anion gap: 4 — ABNORMAL LOW (ref 5–15)
BUN: 19 mg/dL (ref 6–20)
CO2: 25 mmol/L (ref 22–32)
Calcium: 7.9 mg/dL — ABNORMAL LOW (ref 8.9–10.3)
Chloride: 107 mmol/L (ref 98–111)
Creatinine, Ser: 0.9 mg/dL (ref 0.61–1.24)
GFR, Estimated: >60 mL/min (ref >60)
Glucose, Bld: 133 mg/dL — ABNORMAL HIGH (ref 70–99)
Potassium: 4.3 mmol/L (ref 3.5–5.1)
Sodium: 136 mmol/L (ref 135–145)
Total Bilirubin: 1.1 mg/dL (ref 0.0–1.2)
Total Protein: 5 g/dL — ABNORMAL LOW (ref 6.5–8.1)

## 2024-03-27 LAB — CK: Total CK: 543 U/L — ABNORMAL HIGH (ref 49–397)

## 2024-03-27 LAB — MAGNESIUM: Magnesium: 2.2 mg/dL (ref 1.7–2.4)

## 2024-03-27 LAB — CBC
HCT: 32.1 % — ABNORMAL LOW (ref 39.0–52.0)
Hemoglobin: 10.6 g/dL — ABNORMAL LOW (ref 13.0–17.0)
MCH: 31.5 pg (ref 26.0–34.0)
MCHC: 33 g/dL (ref 30.0–36.0)
MCV: 95.3 fL (ref 80.0–100.0)
Platelets: 175 10*3/uL (ref 150–400)
RBC: 3.37 MIL/uL — ABNORMAL LOW (ref 4.22–5.81)
RDW: 13.5 % (ref 11.5–15.5)
WBC: 10.3 10*3/uL (ref 4.0–10.5)
nRBC: 0 % (ref 0.0–0.2)

## 2024-03-27 LAB — PHOSPHORUS: Phosphorus: 1.9 mg/dL — ABNORMAL LOW (ref 2.5–4.6)

## 2024-03-27 LAB — LACTIC ACID, PLASMA
Lactic Acid, Venous: 1.3 mmol/L (ref 0.5–1.9)
Lactic Acid, Venous: 0.9 mmol/L (ref 0.5–1.9)

## 2024-03-27 MED ORDER — SODIUM PHOSPHATES 45 MMOLE/15ML IV SOLN
30.0000 mmol | Freq: Once | INTRAVENOUS | Status: AC
  Administered 2024-03-27: 30 mmol via INTRAVENOUS
  Filled 2024-03-27: qty 10

## 2024-03-27 MED ORDER — ALPRAZOLAM 0.25 MG PO TABS
1.0000 mg | ORAL_TABLET | Freq: Three times a day (TID) | ORAL | Status: DC
  Administered 2024-03-27 – 2024-03-30 (×11): 1 mg
  Filled 2024-03-27 (×11): qty 4

## 2024-03-27 MED ORDER — IPRATROPIUM-ALBUTEROL 0.5-2.5 (3) MG/3ML IN SOLN
RESPIRATORY_TRACT | Status: AC
  Administered 2024-03-27: 3 mL
  Filled 2024-03-27: qty 3

## 2024-03-27 MED ORDER — ATROPINE SULFATE 1 MG/10ML IJ SOSY
1.0000 mg | PREFILLED_SYRINGE | Freq: Once | INTRAMUSCULAR | Status: AC
  Administered 2024-03-27: 1 mg via INTRAVENOUS

## 2024-03-27 MED ORDER — ATROPINE SULFATE 1 MG/10ML IJ SOSY: 1.0000 mg | PREFILLED_SYRINGE | Freq: Once | INTRAMUSCULAR | Status: DC

## 2024-03-27 MED ORDER — BETHANECHOL CHLORIDE 10 MG PO TABS
10.0000 mg | ORAL_TABLET | Freq: Three times a day (TID) | ORAL | Status: DC
  Administered 2024-03-27 – 2024-03-28 (×4): 10 mg
  Filled 2024-03-27 (×4): qty 1

## 2024-03-27 MED ORDER — IPRATROPIUM-ALBUTEROL 0.5-2.5 (3) MG/3ML IN SOLN
3.0000 mL | RESPIRATORY_TRACT | Status: DC | PRN
  Administered 2024-03-29 – 2024-03-30 (×2): 3 mL via RESPIRATORY_TRACT
  Filled 2024-03-27 (×2): qty 3

## 2024-03-27 MED ORDER — SODIUM CHLORIDE 3 % IN NEBU
4.0000 mL | INHALATION_SOLUTION | Freq: Every day | RESPIRATORY_TRACT | Status: AC
  Administered 2024-03-27 – 2024-03-29 (×3): 4 mL via RESPIRATORY_TRACT
  Filled 2024-03-27 (×3): qty 4

## 2024-03-27 NOTE — Progress Notes (Signed)
 NEUROLOGY CONSULT FOLLOW UP NOTE   Date of service: Mar 27, 2024 Patient Name: Ricky Werner MRN:  [id number] DOB:  [birth year]  Interval Hx/subjective  Continues to be intubated. Received 100 mcg IV fentanyl  for agitation about 1 hour prior to neurology exam.  Vitals   Vitals:   03/27/24 1345 03/27/24 1400 03/27/24 1511 03/27/24 1519  BP:  93/69    Pulse: 64 65 74 90  Resp: (!) 24 (!) 24 17 (!) 22  Temp:    (!) 97.1 F (36.2 C)  TempSrc:    Axillary  SpO2: 95% 94% (!) 87% 94%  Weight:      Height:         Body mass index is 31.31 kg/m.  Physical Exam   Constitutional: Appears acutely ill HENT: ETT in place Head: Normocephalic. No neck stiffness.  Respiratory: Intubated Skin: WDI.    Neurologic Examination    Ment: Patient is intubated. Asleep/sedated upon examiner entering the room. Did not arouse to calling of his name. Partially awakened and made a combative pose with his arms while looking at examiner after examiner elicits patellar reflexes. He then squeezed examiner's hands to command. Otherwise with eyes closed, not following commands and with no attempts to communicate. CN: With forced eye opening, no nystagmus. Eyes are midline. Does not track examiner. Face is grossly symmetric in the context of ETT.  Motor/Sensory: Briskly makes a boxing pose with BUE after examiner elicits patellar reflexes. Withdraws BLE briskly to noxious plantar stimulation, without asymmetry.  Reflexes: Brisk patellar reflexes without clonus or asymmetry. Toes equivocal bilaterally Cerebellar: Not following commands for assessment Gait: Unable to assess   Medications  Current Facility-Administered Medications:    ALPRAZolam  (XANAX ) tablet 1 mg, 1 mg, Per Tube, TID, Bowser, Grace E, NP, 1 mg at 03/27/24 0857   Ampicillin -Sulbactam (UNASYN ) 3 g in sodium chloride  0.9 % 100 mL IVPB, 3 g, Intravenous, Q6H, Ananias Balls, RPH, Stopped at 03/27/24 1249   atropine 1 MG/10ML injection 1 mg,  1 mg, Intravenous, Once, Chand, Sudham, MD   bethanechol  (URECHOLINE ) tablet 10 mg, 10 mg, Per Tube, TID, Bowser, Darin Edinger, NP, 10 mg at 03/27/24 1215   Chlorhexidine  Gluconate Cloth 2 % PADS 6 each, 6 each, Topical, Q2200, [name], John D, PA-C, 6 each at 03/26/24 2300   cyanocobalamin  (VITAMIN B12) tablet 1,000 mcg, 1,000 mcg, Per Tube, Daily, Oralee Billow I, RPH, 1,000 mcg at 03/27/24 1122   dexmedetomidine  (PRECEDEX ) 400 MCG/100ML (4 mcg/mL) infusion, 0-1.2 mcg/kg/hr, Intravenous, Continuous, [name], John D, PA-C, Stopped at 03/27/24 1037   docusate (COLACE) 50 MG/5ML liquid 100 mg, 100 mg, Per Tube, BID, [name], John D, PA-C, 100 mg at 03/27/24 1601   escitalopram  (LEXAPRO ) tablet 10 mg, 10 mg, Per Tube, QHS, Bowser, Darin Edinger, NP, 10 mg at 03/26/24 2145   famotidine  (PEPCID ) tablet 20 mg, 20 mg, Per Tube, BID, [name], John D, PA-C, 20 mg at 03/27/24 0858   feeding supplement (OSMOLITE 1.5 CAL) liquid 1,000 mL, 1,000 mL, Per Tube, Continuous, Desai, Nikita S, MD, Last Rate: 30 mL/hr at 03/27/24 1500, Infusion Verify at 03/27/24 1500   feeding supplement (PROSource TF20) liquid 60 mL, 60 mL, Per Tube, BID, Desai, Nikita S, MD, 60 mL at 03/27/24 0856   fentaNYL  (SUBLIMAZE ) injection 50 mcg, 50 mcg, Intravenous, Q15 min PRN, Bowser, Darin Edinger, NP, 50 mcg at 03/26/24 2242   fentaNYL  (SUBLIMAZE ) injection 50-200 mcg, 50-200 mcg, Intravenous, Q30 min PRN, Bowser, Darin Edinger, NP, 100 mcg  at 03/27/24 0855   folic acid  (FOLVITE ) tablet 1 mg, 1 mg, Per Tube, Daily, [name], John D, PA-C, 1 mg at 03/27/24 7253   heparin  injection 5,000 Units, 5,000 Units, Subcutaneous, Q8H, [name], John D, PA-C, 5,000 Units at 03/27/24 1215   insulin  aspart (novoLOG ) injection 0-9 Units, 0-9 Units, Subcutaneous, Q4H, [name], John D, PA-C, 1 Units at 03/27/24 1215   ipratropium-albuterol  (DUONEB) 0.5-2.5 (3) MG/3ML nebulizer solution 3 mL, 3 mL, Nebulization, Q4H PRN, Bowser, Grace E, NP   [COMPLETED] levETIRAcetam  (KEPPRA ) IVPB 1000  mg/100 mL premix, 2,000 mg, Intravenous, Once, Stopped at 03/25/24 1941 **FOLLOWED BY** levETIRAcetam  (KEPPRA ) IVPB 1000 mg/100 mL premix, 1,000 mg, Intravenous, Q12H, [name], RPH, Stopped at 03/27/24 0620   multivitamin with minerals tablet 1 tablet, 1 tablet, Per Tube, Daily, [name], John D, PA-C, 1 tablet at 03/27/24 6644   Oral care mouth rinse, 15 mL, Mouth Rinse, Q2H, [name], John D, PA-C, 15 mL at 03/27/24 1200   Oral care mouth rinse, 15 mL, Mouth Rinse, PRN, [name], John D, PA-C   polyethylene glycol (MIRALAX  / GLYCOLAX ) packet 17 g, 17 g, Per Tube, Daily, Casimiro Cleaves, PA-C, 17 g at 03/27/24 0856   polyethylene glycol (MIRALAX  / GLYCOLAX ) packet 17 g, 17 g, Per Tube, Daily PRN, Oralee Billow I, RPH   sodium chloride  HYPERTONIC 3 % nebulizer solution 4 mL, 4 mL, Nebulization, Daily, Bowser, Darin Edinger, NP, 4 mL at 03/27/24 0854   sodium phosphate 30 mmol in sodium chloride  0.9 % 250 mL infusion, 30 mmol, Intravenous, Once, Oralee Billow I, [facility], Last Rate: 43 mL/hr at 03/27/24 1500, Infusion Verify at 03/27/24 1500   thiamine  (VITAMIN B1) injection 100 mg, 100 mg, Intravenous, Daily, Casimiro Cleaves, PA-C, 100 mg at 03/27/24 0857  Labs and Diagnostic Imaging   CBC:  Recent Labs  Lab 03/25/24 1655 03/25/24 1657 03/26/24 0507 03/27/24 0726  WBC 15.0*  --  12.5* 10.3  NEUTROABS 10.4*  --   --   --   HGB 12.8*   < > 10.7* 10.6*  HCT 40.4   < > 32.2* 32.1*  MCV 98.1  --  95.5 95.3  PLT 275  --  209 175   < > = values in this interval not displayed.    Basic Metabolic Panel:  Lab Results  Component Value Date   NA 136 03/27/2024   K 4.3 03/27/2024   CO2 25 03/27/2024   GLUCOSE 133 (H) 03/27/2024   BUN 19 03/27/2024   CREATININE 0.90 03/27/2024   CALCIUM  7.9 (L) 03/27/2024   GFRNONAA >60 03/27/2024   GFRAA >60 08/16/2020   Lipid Panel: No results found for: "LDLCALC" HgbA1c:  Lab Results  Component Value Date   HGBA1C 5.4 03/25/2024   Urine Drug Screen:      Component Value Date/Time   LABOPIA NONE DETECTED 03/25/2024 2143   COCAINSCRNUR POSITIVE (A) 03/25/2024 2143   LABBENZ POSITIVE (A) 03/25/2024 2143   AMPHETMU POSITIVE (A) 03/25/2024 2143   THCU NONE DETECTED 03/25/2024 2143   LABBARB NONE DETECTED 03/25/2024 2143    Alcohol  Level     Component Value Date/Time   ETH <15 03/25/2024 1655   INR  Lab Results  Component Value Date   INR 1.2 03/25/2024   APTT  Lab Results  Component Value Date   APTT 24 03/25/2024     Assessment  58 year old man with past medical history significant for alcohol  abuse and benzodiazepine dependence, cigarette smoking, hypertension, alcohol  withdrawal  seizures as well as concern for unprovoked seizures, on Keppra . Son found him on Wednesday (4/30) at 4:30 PM with his head slumped backwards while sitting on the couch. No evidence of trauma, no tongue bite or urinary incontinence was noted.  Initially son thought that the patient was just taking a nap but when he tried to wake him up to tell him he was leaving the house, he realized that the patient was nonresponsive and "like a rag doll" with depressed respiratory rate. He was brought to the ED where he was evaluated as a Code Stroke by Dr. Cleone Dad via Teleneurology. Initially patient observed to have roving eye movements per ED provider with reactive pupils; was plegic on the left but spontaneously moving the right side.  He was actively being bagged to due to hypoxia. Over the course of the Teleneurology evaluation, the patient was noted to have increasing movement on the initially plegic left side; was able to squeeze and let go bilaterally (weaker on the left than the right) but due to persistent hypoxia was intubated. Per ED report, after intubation he was agitated, moving all 4 extremities grossly equally. Per Teleneurology note, it was felt that his presentation was most likely due to a focal seizure with rapidly resolving post-ictal Todd's paralysis. He was  loaded with Keppra  2000 mg at OSH prior to transfer to [facility].  - Exam today, after Fentanyl  100 mcg push 1 hour prior for agitation, reveals a sedated but partially arousable patient who moves all 4 extremities equally. He briefly opens his eyes.  - Imaging: - MRI shows concern for opoid-overdose related changes (CHANTER). Not on opioids at home and UDS was negative for opiates, but our tox screen is not sensitive for fentanyl .  - MRA negative. - UDS was positive for benzodiazepine, cocaine and amphetamine. Suspect possible substance-related seizure at home versus toxidrome or a combination of both.  - LTM EEG report for today: Continuous slow, generalized. This study is suggestive of moderate diffuse encephalopathy. No seizures or epileptiform discharges were seen throughout the recording. Event button was pressed on 03/27/2024 at 0836 while biting ETT, hypoxic and bradycardic in 30s without concomitant eeg change. This was not an epileptic event.  - Impressions:  - Acute presentation most likely secondary to focal seizure with post-ictal Todd's paralysis.  - Toxic metabolic encephalopathy in the setting of hyperglycemia and cocaine abuse as well as possible breakthrough seizure due to the same. - Potential for alcohol  withdrawal and benzo withdrawal as contributing factors. Lowered seizure threshold secondary to hyperglycemia. Possible missed medications secondary to memory issues - Other comorbidities: Concern for renal dysfunction (AKI vs. CKD, no recent labs) . Hypoxia s/p intubation Concern for underlying memory dysfunction potentially in the setting of polypharmacy / ethanol abuse  Recommendations  - Continue LTM EEG - Continue to wean sedation - Continue Keppra  1000 mg BID - Continue electrolyte replacements as you are - B12 level, empiric 1000 mcg daily. B12 level came back mildly low by neurological standards at 379 - Agree with empiric thiamine  supplementation - Inpatient seizure  precautions - Ok from neurological standpoint for extubation.  35 minutes spent in the neurological evaluation and management of this critically ill patient ______________________________________________________________________   Hope Ly, Lakeva Hollon, MD [facility]

## 2024-03-27 NOTE — Progress Notes (Signed)
 eLink Physician-Brief Progress Note Patient Name: Ricky Werner DOB: 11-13-1966 MRN: [id number]   Date of Service  03/27/2024  HPI/Events of Note  Admitted for seizure activity encephalopathy in the setting of drug use and alcohol  withdrawal complicated by acute hypoxic respiratory failure.  Patient on LR which is not expired.  +7 L for stay, +2.3 L in past 24 hours.  450 cc of urine output with in/out cath  eICU Interventions  No changes, allow fluids to expire.     Intervention Category Minor Interventions: Routine modifications to care plan (e.g. PRN medications for pain, fever)  Icelynn Onken 03/27/2024, 12:41 AM

## 2024-03-27 NOTE — Progress Notes (Signed)
 NAME:  Ricky Werner, MRN:  [id number], DOB:  Nov 14, 1966, LOS: 2 ADMISSION DATE:  03/25/2024, CONSULTATION DATE:  4/30 REFERRING MD:  Dr. Kermit Ped , CHIEF COMPLAINT:  unresponsive; possible seizure; acute respiratory failure   History of Present Illness:  Patient is a 58 year old male with pertinent PMH alcohol  use with history of alcohol  withdrawal and withdrawal seizure, tobacco/marijuana abuse, HTN, anxiety/depression presents to [facility] ED on 4/30 unresponsive.  Patient has history of alcohol  withdrawal seizures last admitted on 2018.  On Keppra .  Patient lives with son.  Family states he has not been drinking regularly since he has been out of work.  On 4/30 patient LKN an hour and a half ago and son found patient on floor unresponsive.  Patient started CPR.  EMS arrived and patient unresponsive but had a pulse.  Patient given Narcan with no effect.  Patient noted to be bradycardic and placed on supplemental O2.  Transported to [facility] ED.  On arrival BP soft.  GCS 7.  Localizes to pain on right side.  On NRB with sats mid 80s requiring intubation.  Patient required levo for hypotension.  CT head no acute findings.  Neuro consulted recommending MRI.  Gave Keppra  load.  Started on thiamine . Ethanol wnl.  UDS and Keppra  level pending.  CT chest showing peribronchovascular infiltrates versus atelectasis in L>R favoring aspiration. Wbc 15. Cultures obtained and started on rocephin /azithro. Glucose 459, co2 15, AG 21. UA, Beta H, Osm pending. Given insulin .   Pertinent ED Labs: Creat 1.96, AST 64, ALT 83,  Pertinent  Medical History   Past Medical History:  Diagnosis Date   Alcohol  abuse    Anxiety    BPH (benign prostatic hyperplasia)    Carpal tunnel syndrome on right    Chronic back pain    Depression    Headache    History of dental problems    Hypertension    Osteoarthritis    Panic attacks    Right tennis elbow    Sciatica    Seizures (HCC) 09/30/2020   Vertigo      Significant  Hospital Events: Including procedures, antibiotic start and stop dates in addition to other pertinent events   4/30 admitted w/ acute encephalopathy intubated 5/1 weaning sedation. Decr FiO2 to 60%. MRI/A 5/2 near arrest after being repositioned. Brady w pauses, hypoxemic, biting tube.   Interim History / Subjective:  When seen initially, was awakening and following commands. FiO2 weaned to 40%.   Later in morning (before this note was written) pt repositioned and nearly arrested -- biting tube becoming bradycardic and hypoxemic. -- see separate progress note 5/2 for event details  Objective   Blood pressure 130/78, pulse 88, temperature 97.7 F (36.5 C), temperature source Axillary, resp. rate (!) 24, height 5\' 6"  (1.676 m), weight 88 kg, SpO2 91%.    Vent Mode: AC;PRVC FiO2 (%):  [40 %-70 %] 40 % Set Rate:  [24 bmp] 24 bmp Vt Set:  [510 mL] 510 mL PEEP:  [10 cmH20] 10 cmH20 Plateau Pressure:  [21 cmH20-27 cmH20] 23 cmH20   Intake/Output Summary (Last 24 hours) at 03/27/2024 0955 Last data filed at 03/27/2024 0900 Gross per 24 hour  Intake 3504.78 ml  Output 450 ml  Net 3054.78 ml   Filed Weights   03/25/24 2300 03/26/24 0500 03/27/24 0500  Weight: 85.6 kg 85.6 kg 88 kg    Examination: General:  Critically ill M intubated sedated  HEENT: [place]. Bleeding in mouth. ETT secure bite block  in place. EEG in place   Neuro: sedated.  CV: rr cap refill < 3 sec  PULM:  Clear. Mechanically ventilated. Small amount of thick tan secretions.  GI: soft ndnt  Extremities: no obvious joint deformity  Skin: c/d/w GU: external pouch    Resolved Hospital Problem list    Shock  AKI  Assessment & Plan:   Possible OOH arrest vs decr LOC in setting of below  -family did CPR when they found pt unresponsive. 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 Based on MRI findings)  Polysubstance abuse (UDS +cocaine, amphetamines, BZD --home med -- and pt endorses fent use) EtOH abuse w withdrawal sz Hx unprovoked sz  P -MRI/MRA ordered -cEEG -sz precautions  -BID keppra   -empiric 1000 mcg B12 + 1mg  folic acid   -wean sedation as able. Versed  gtt dc. Fent gtt changed for PRN fent. Cont precedex . Adding home Xanax  per tube  -does current need his BUE soft restraints. De-escalate as able.   Bradycardia with pauses, improved -see brief note 5/2 detailing event. Brady/hypoxemic after repositioning. Noted to be biting tube. Possible vagal event vs sz, or other etiology P -cont ICU monitoring -supportive care as outlined in separate note & below   Acute resp failure w hypoxia Aspiration PNA v CAP R>L  Small pleural effusions Possible chronic upper airway obstruction (See dr wert office notes)  -GPC GNR in trach asp.  P -CXR, HTN, duoneb following acute decompensation 5/2 -unasyn  -cont MV support -VAP, pulm hygiene -WUA/SBT  -RASS goal 0 to -1  -has been referred to ENT output  Hypophosphatemia P -Na phos  Elevated LA  Elevated CK P -trend PRN   Elevated LFTs  -Likely related to alcohol  abuse Plan: -follow LFTs PRNs  Hx HTN Plan: - holding home meds, recently stopped periph pressors  Hx of anxiety/depression Plan: -decr to 1/2 dose of home xanax  5/2 (now 1mg  TID)  Chronic pain Plan: - holding home robaxin    Inadequate PO intake Plan: -EN per RDN  Best Practice (right click and "Reselect all SmartList Selections" daily)   Diet/type: NPO w/ meds via tube DVT prophylaxis prophylactic heparin   Pressure ulcer(s): N/A GI prophylaxis: H2B Lines: N/A Foley:  N/A Code Status:  full code Last date of multidisciplinary goals of care discussion [4/30 updated son Ricky Werner over phone]  Labs   CBC: Recent Labs  Lab 03/25/24 1655 03/25/24 1657 03/26/24 0507 03/27/24 0726  WBC 15.0*  --  12.5* 10.3   NEUTROABS 10.4*  --   --   --   HGB 12.8* 13.9 10.7* 10.6*  HCT 40.4 41.0 32.2* 32.1*  MCV 98.1  --  95.5 95.3  PLT 275  --  209 175    Basic Metabolic Panel: Recent Labs  Lab 03/25/24 1655 03/25/24 1657 03/25/24 1929 03/26/24 0507 03/26/24 1441 03/27/24 0726  NA 135 136  --  142  --  136  K 4.1 4.2  --  3.9  --  4.3  CL 99 101  --  107  --  107  CO2 15*  --   --  26  --  25  GLUCOSE 459* 453*  --  84  --  133*  BUN 23* 25*  --  21*  --  19  CREATININE 1.96* 1.90*  --  1.25*  --  0.90  CALCIUM  8.6*  --   --  7.6*  --  7.9*  MG  --   --  1.9 1.2*  --  2.2  PHOS  --   --   --   --  2.3* 1.9*   GFR: Estimated Creatinine Clearance: 90.7 mL/min (by C-G formula based on SCr of 0.9 mg/dL). Recent Labs  Lab 03/25/24 1655 03/25/24 1929 03/26/24 0507 03/26/24 0844 03/27/24 0726  WBC 15.0*  --  12.5*  --  10.3  LATICACIDVEN  --  3.0* 2.5* 4.8*  --     Liver Function Tests: Recent Labs  Lab 03/25/24 1655 03/26/24 0507 03/27/24 0726  AST 64* 48* 33  ALT 83* 59* 48*  ALKPHOS 39 24* 23*  BILITOT 0.7 1.0 1.1  PROT 6.9 4.7* 5.0*  ALBUMIN 3.9 2.5* 2.5*   No results for input(s): "LIPASE", "AMYLASE" in the last 168 hours. Recent Labs  Lab 03/25/24 2327  AMMONIA 34    ABG    Component Value Date/Time   PHART 7.34 (L) 03/26/2024 0304   PCO2ART 52 (H) 03/26/2024 0304   PO2ART 76 (L) 03/26/2024 0304   HCO3 28.6 (H) 03/26/2024 0304   TCO2 19 (L) 03/25/2024 1657   ACIDBASEDEF 3.2 (H) 03/25/2024 2330   O2SAT 97.5 03/26/2024 0304     Coagulation Profile: Recent Labs  Lab 03/25/24 1655  INR 1.2    Cardiac Enzymes: Recent Labs  Lab 03/25/24 1929 03/27/24 0726  CKTOTAL 401* 543*    HbA1C: Hgb A1c MFr Bld  Date/Time Value Ref Range Status  03/25/2024 07:29 PM 5.4 4.8 - 5.6 % Final    Comment:    (NOTE) Pre diabetes:          5.7%-6.4%  Diabetes:              >6.4%  Glycemic control for   <7.0% adults with diabetes     CBG: Recent Labs  Lab  03/26/24 1521 03/26/24 1941 03/26/24 2341 03/27/24 0341 03/27/24 0741  GLUCAP 92 123* 107* 121* 147*   CRITICAL CARE Performed by: Delories Fetter   Total critical care time: 51 minutes  Critical care time was exclusive of separately billable procedures and treating other patients. Critical care was necessary to treat or prevent imminent or life-threatening deterioration.  Critical care was time spent personally by me on the following activities: development of treatment plan with patient and/or surrogate as well as nursing, discussions with consultants, evaluation of patient's response to treatment, examination of patient, obtaining history from patient or surrogate, ordering and performing treatments and interventions, ordering and review of laboratory studies, ordering and review of radiographic studies, pulse oximetry and re-evaluation of patient's condition.  Eston Hence MSN, AGACNP-BC [place] [facility] Amion for pager 03/27/2024, 9:55 AM

## 2024-03-27 NOTE — Progress Notes (Signed)
EEG maint complete.  ?

## 2024-03-27 NOTE — Progress Notes (Signed)
 Vagal event after repositioning patient. Multiple cardiac pauses, heart rate in 30s, and Spo2 down to 77. MD and respiratory at bedside. Atropine 1 mg given hypertonic 3% neb given. Very thick secretions suctioned from ETT. Patient very agitated fentanyl  bolus 100 mcg given. Patient able to follow commands. Vital signs/recovery returned to baseline quickly.

## 2024-03-27 NOTE — Procedures (Signed)
 Patient Name: Ricky Werner  MRN: [id number]  Epilepsy Attending: Arleene Lack  Referring Physician/Provider: Tona Francis, MD  Duration: 03/26/2024 0901 to 03/27/2024 1015  Patient history: 58yo M with left-sided weakness. EEG to evaluate for seizure  Level of alertness: Awake, asleep  AEDs during EEG study: Xanax , LEV  Technical aspects: This EEG study was done with scalp electrodes positioned according to the 10-20 International system of electrode placement. Electrical activity was reviewed with band pass filter of 1-70Hz , sensitivity of 7 uV/mm, display speed of 61mm/sec with a 60Hz  notched filter applied as appropriate. EEG data were recorded continuously and digitally stored.  Video monitoring was available and reviewed as appropriate.  Description: The posterior dominant rhythm consists of 9-10 Hz activity of moderate voltage (25-35 uV) seen predominantly in posterior head regions, symmetric and reactive to eye opening and eye closing. Sleep was characterized by vertex waves, sleep spindles (12 to 14 Hz), maximal frontocentral region. EEG showed continuous generalized 3 to 6 Hz theta-delta slowing admixed with 12-15hz  beta activity distributed symmetrically and diffusely. Hyperventilation and photic stimulation were not performed.    Event button was pressed on 03/27/2024 at 0836 biting tube, hypoxic and bradycardic in 30s. Concomitant eeg before, during and after the event didn't show any eeg change to suggest seizure   EEG was disconnected between 03/26/2024  0944 to 1120.  ABNORMALITY - Continuous slow, generalized  IMPRESSION: This study is suggestive of moderate diffuse encephalopathy. No seizures or epileptiform discharges were seen throughout the recording.  Event button was pressed on 03/27/2024 at 0836 biting tube, hypoxic and bradycardic in 30s without concomitant eeg change. This was not an epileptic event.   Kyser Wandel O Jerman Tinnon

## 2024-03-27 NOTE — Progress Notes (Addendum)
 Brief Event Note   Emergently to bedside for bradycardia HR 30s reported pauses and undetectable pleth after being adjusted in bed.  Grey in color  Biting down on tube, nearly completely occluding ETT  Mouth with fresh blood  Unresponsive   While atropine being obtained -- FiO2 to 100% Mouth opened, suction passed and small amount of thick secretions suctioned Bite block placed  Sedation resumed + bolus given  Mentation improved when sats incr to 70s. Slow recovery to 90s. Lung sounds CTA, maybe a little tight but no overt wheezing.  STAT CXR   Duoneb, HTS neb  cEEG event button pressed in case this was precipitated by sz (ie sz --> biting tube --> hypoxic --> brady)      Cct 15 min     Eston Hence MSN, AGACNP-BC [place] [facility] Amion for pager  03/27/2024, 8:57 AM

## 2024-03-28 ENCOUNTER — Inpatient Hospital Stay (HOSPITAL_COMMUNITY)

## 2024-03-28 DIAGNOSIS — G8384 Todd's paralysis (postepileptic): Secondary | ICD-10-CM | POA: Diagnosis not present

## 2024-03-28 DIAGNOSIS — G928 Other toxic encephalopathy: Secondary | ICD-10-CM | POA: Diagnosis not present

## 2024-03-28 DIAGNOSIS — R569 Unspecified convulsions: Secondary | ICD-10-CM | POA: Diagnosis not present

## 2024-03-28 DIAGNOSIS — R001 Bradycardia, unspecified: Secondary | ICD-10-CM | POA: Diagnosis not present

## 2024-03-28 DIAGNOSIS — J9601 Acute respiratory failure with hypoxia: Secondary | ICD-10-CM | POA: Diagnosis not present

## 2024-03-28 DIAGNOSIS — J9602 Acute respiratory failure with hypercapnia: Secondary | ICD-10-CM

## 2024-03-28 DIAGNOSIS — R739 Hyperglycemia, unspecified: Secondary | ICD-10-CM | POA: Diagnosis not present

## 2024-03-28 LAB — GLUCOSE, CAPILLARY
Glucose-Capillary: 108 mg/dL — ABNORMAL HIGH (ref 70–99)
Glucose-Capillary: 113 mg/dL — ABNORMAL HIGH (ref 70–99)
Glucose-Capillary: 114 mg/dL — ABNORMAL HIGH (ref 70–99)
Glucose-Capillary: 130 mg/dL — ABNORMAL HIGH (ref 70–99)
Glucose-Capillary: 130 mg/dL — ABNORMAL HIGH (ref 70–99)
Glucose-Capillary: 133 mg/dL — ABNORMAL HIGH (ref 70–99)
Glucose-Capillary: 139 mg/dL — ABNORMAL HIGH (ref 70–99)
Glucose-Capillary: 141 mg/dL — ABNORMAL HIGH (ref 70–99)

## 2024-03-28 LAB — MAGNESIUM: Magnesium: 2 mg/dL (ref 1.7–2.4)

## 2024-03-28 LAB — POCT I-STAT 7, (LYTES, BLD GAS, ICA,H+H)
Acid-Base Excess: 2 mmol/L (ref 0.0–2.0)
Bicarbonate: 27.7 mmol/L (ref 20.0–28.0)
Calcium, Ion: 1.2 mmol/L (ref 1.15–1.40)
HCT: 27 % — ABNORMAL LOW (ref 39.0–52.0)
Hemoglobin: 9.2 g/dL — ABNORMAL LOW (ref 13.0–17.0)
O2 Saturation: 92 %
Patient temperature: 98.4
Potassium: 3.6 mmol/L (ref 3.5–5.1)
Sodium: 140 mmol/L (ref 135–145)
TCO2: 29 mmol/L (ref 22–32)
pCO2 arterial: 50.1 mmHg — ABNORMAL HIGH (ref 32–48)
pH, Arterial: 7.351 (ref 7.35–7.45)
pO2, Arterial: 68 mmHg — ABNORMAL LOW (ref 83–108)

## 2024-03-28 LAB — CULTURE, RESPIRATORY W GRAM STAIN: Culture: NORMAL

## 2024-03-28 LAB — PHOSPHORUS: Phosphorus: 2.5 mg/dL (ref 2.5–4.6)

## 2024-03-28 MED ORDER — SENNA 8.6 MG PO TABS
2.0000 | ORAL_TABLET | Freq: Every day | ORAL | Status: DC
  Administered 2024-03-28 – 2024-03-29 (×2): 17.2 mg
  Filled 2024-03-28 (×2): qty 2

## 2024-03-28 MED ORDER — EPINEPHRINE 1 MG/10ML IJ SOSY
PREFILLED_SYRINGE | INTRAMUSCULAR | Status: AC
  Filled 2024-03-28: qty 10

## 2024-03-28 MED ORDER — PROPOFOL 1000 MG/100ML IV EMUL
5.0000 ug/kg/min | INTRAVENOUS | Status: DC
  Administered 2024-03-28 (×2): 30 ug/kg/min via INTRAVENOUS
  Administered 2024-03-29: 40 ug/kg/min via INTRAVENOUS
  Administered 2024-03-29: 30 ug/kg/min via INTRAVENOUS
  Administered 2024-03-29 – 2024-03-30 (×4): 40 ug/kg/min via INTRAVENOUS
  Filled 2024-03-28 (×9): qty 100
  Filled 2024-03-28: qty 200

## 2024-03-28 MED ORDER — BETHANECHOL CHLORIDE 25 MG PO TABS
25.0000 mg | ORAL_TABLET | Freq: Three times a day (TID) | ORAL | Status: DC
  Administered 2024-03-28 – 2024-03-30 (×7): 25 mg
  Filled 2024-03-28 (×7): qty 1

## 2024-03-28 MED ORDER — NOREPINEPHRINE 4 MG/250ML-% IV SOLN
2.0000 ug/min | INTRAVENOUS | Status: DC
  Filled 2024-03-28: qty 250

## 2024-03-28 MED ORDER — PROPOFOL 1000 MG/100ML IV EMUL
INTRAVENOUS | Status: AC
  Administered 2024-03-28: 40 ug/kg/min via INTRAVENOUS
  Filled 2024-03-28: qty 100

## 2024-03-28 MED ORDER — SODIUM CHLORIDE 0.9 % IV SOLN: 250.0000 mL | INTRAVENOUS | Status: AC

## 2024-03-28 MED ORDER — DOXAZOSIN MESYLATE 2 MG PO TABS
2.0000 mg | ORAL_TABLET | Freq: Every day | ORAL | Status: AC
  Administered 2024-03-28 – 2024-03-30 (×3): 2 mg
  Filled 2024-03-28 (×3): qty 1

## 2024-03-28 MED ORDER — ATROPINE SULFATE 1 MG/10ML IJ SOSY
PREFILLED_SYRINGE | INTRAMUSCULAR | Status: AC
  Filled 2024-03-28: qty 10

## 2024-03-28 MED ORDER — OXYCODONE HCL 5 MG PO TABS: 5.0000 mg | ORAL_TABLET | ORAL | Status: DC | PRN

## 2024-03-28 NOTE — Progress Notes (Signed)
 LTM maint complete - no skin breakdown under:  Cz, P8. Head wrapped.

## 2024-03-28 NOTE — Progress Notes (Signed)
 Patient experiencing urinary retention. 6am bladder scan revealed 519 actual removal 750 .

## 2024-03-28 NOTE — Progress Notes (Signed)
 NEUROLOGY CONSULT FOLLOW UP NOTE   Date of service: Mar 28, 2024 Patient Name: Ricky Werner MRN:  [id number] DOB:  Jul 14, 1966  Interval Hx/subjective  Intubated and sedated.   Vitals   Vitals:   03/28/24 1145 03/28/24 1200 03/28/24 1215 03/28/24 1230  BP: 109/77 115/78 117/81 116/77  Pulse: (!) 51 (!) 49 (!) 50 (!) 51  Resp: (!) 24 (!) 24 (!) 24 (!) 24  Temp:  (!) 96.9 F (36.1 C)  98.6 F (37 C)  TempSrc:  Axillary  Oral  SpO2: 96% 99% 97% 97%  Weight:      Height:         Body mass index is 31.7 kg/m.  Physical Exam   Constitutional: Well-developed and well-nourished HENT: ETT in place Head: Normocephalic. No neck stiffness.  Respiratory: Intubated Skin: WDI.    Neurologic Examination    Ment: Patient is intubated. Sedated on propofol at a rate of 30. Did not arouse to calling of his name. No movement to external stimuli.  CN: With forced eye opening, no nystagmus. Eyes are midline. Does not track examiner. Does not blink to threat. No roving EOM. Face is flaccidly symmetric.  Motor/Sensory: Flaccid tone x 4. No movement to light noxious stimuli and light sternal rub. Ricky Werner  Reflexes:  Hypoactive in the context of sedation. Toes mute bilaterally Cerebellar: Unable to assess Gait: Unable to assess  Medications  Current Facility-Administered Medications:    0.9 %  sodium chloride  infusion, 250 mL, Intravenous, Continuous, Ricky Werner, [name], Ricky Werner, Last Rate: 10 mL/hr at 03/28/24 1000, Infusion Verify at 03/28/24 1000   ALPRAZolam  (XANAX ) tablet 1 mg, 1 mg, Per Tube, TID, Ricky Werner, Ricky Edinger, NP, 1 mg at 03/28/24 0905   Ampicillin -Sulbactam (UNASYN ) 3 g in sodium chloride  0.9 % 100 mL IVPB, 3 g, Intravenous, Q6H, Ricky Werner, Ricky Werner, Last Rate: 200 mL/hr at 03/28/24 1154, 3 g at 03/28/24 1154   bethanechol  (URECHOLINE ) tablet 25 mg, 25 mg, Per Tube, TID, Ricky Werner, Ricky Werner   Chlorhexidine  Gluconate Cloth 2 % PADS 6 each, 6 each, Topical, Q2200, [name], Ricky Werner, Ricky Werner, 6 each at  03/27/24 2051   cyanocobalamin  (VITAMIN B12) tablet 1,000 mcg, 1,000 mcg, Per Tube, Daily, Ricky Werner, Ricky Werner, 1,000 mcg at 03/28/24 0905   docusate (COLACE) 50 MG/5ML liquid 100 mg, 100 mg, Per Tube, BID, [name], Ricky Werner, Ricky Werner, 100 mg at 03/28/24 0905   doxazosin (CARDURA) tablet 2 mg, 2 mg, Per Tube, Daily, Ricky Werner, [name], Ricky Werner, 2 mg at 03/28/24 0906   escitalopram  (LEXAPRO ) tablet 10 mg, 10 mg, Per Tube, QHS, Ricky Werner, Ricky Edinger, NP, 10 mg at 03/27/24 2100   famotidine  (PEPCID ) tablet 20 mg, 20 mg, Per Tube, BID, [name], Ricky Werner, Ricky Werner, 20 mg at 03/28/24 7829   feeding supplement (OSMOLITE 1.5 CAL) liquid 1,000 mL, 1,000 mL, Per Tube, Continuous, Ricky Werner, Ricky Werner, Ricky Werner, Last Rate: 50 mL/hr at 03/28/24 1000, Infusion Verify at 03/28/24 1000   feeding supplement (PROSource TF20) liquid 60 mL, 60 mL, Per Tube, BID, Ricky Werner, Ricky Werner, Ricky Werner, 60 mL at 03/27/24 2100   fentaNYL  (SUBLIMAZE ) injection 50 mcg, 50 mcg, Intravenous, Q15 min PRN, Ricky Werner, Ricky Edinger, NP, 50 mcg at 03/28/24 1125   fentaNYL  (SUBLIMAZE ) injection 50-200 mcg, 50-200 mcg, Intravenous, Q30 min PRN, Ricky Werner, Ricky Edinger, NP, 100 mcg at 03/28/24 0449   folic acid  (FOLVITE ) tablet 1 mg, 1 mg, Per Tube, Daily, [name], Ricky Werner, Ricky Werner, 1 mg at 03/28/24 0905   heparin  injection 5,000 Units,  5,000 Units, Subcutaneous, Q8H, Ricky Cleaves, Ricky Werner, 5,000 Units at 03/28/24 0500   insulin  aspart (novoLOG ) injection 0-9 Units, 0-9 Units, Subcutaneous, Q4H, [name], Ricky Werner, Ricky Werner, 1 Units at 03/28/24 1155   ipratropium-albuterol  (DUONEB) 0.5-2.5 (3) MG/3ML nebulizer solution 3 mL, 3 mL, Nebulization, Q4H PRN, Ricky Werner, Ricky Edinger, NP   [COMPLETED] levETIRAcetam  (KEPPRA ) IVPB 1000 mg/100 mL premix, 2,000 mg, Intravenous, Once, Stopped at 03/25/24 1941 **FOLLOWED BY** levETIRAcetam  (KEPPRA ) IVPB 1000 mg/100 mL premix, 1,000 mg, Intravenous, Q12H, [name], Ricky Werner, Stopped at 03/28/24 1610   multivitamin with minerals tablet 1 tablet, 1 tablet, Per Tube, Daily, Ricky Cleaves,  Ricky Werner, 1 tablet at 03/28/24 9604   norepinephrine  (LEVOPHED ) 4mg  in (0.016 mg/mL) premix infusion, 2-10 mcg/min, Intravenous, Titrated, Ricky Werner, [name], Ricky Werner, Last Rate: 7.5 mL/hr at 03/28/24 1000, 2 mcg/min at 03/28/24 1000   Oral care mouth rinse, 15 mL, Mouth Rinse, Q2H, [name], Ricky Werner, Ricky Werner, 15 mL at 03/28/24 1155   Oral care mouth rinse, 15 mL, Mouth Rinse, PRN, Ricky Cleaves, Ricky Werner   oxyCODONE  (Oxy IR/ROXICODONE ) immediate release tablet 5-10 mg, 5-10 mg, Per Tube, Q4H PRN, Ricky Werner, Ricky Werner   polyethylene glycol (MIRALAX  / GLYCOLAX ) packet 17 g, 17 g, Per Tube, Daily, Ricky Cleaves, Ricky Werner, 17 g at 03/28/24 5409   polyethylene glycol (MIRALAX  / GLYCOLAX ) packet 17 g, 17 g, Per Tube, Daily PRN, Ricky Werner, Ricky Werner   propofol (DIPRIVAN) 1000 MG/100ML infusion, 5-80 mcg/kg/min, Intravenous, Titrated, Ricky Werner, Ricky Hunter, Ricky Werner, Last Rate: 21.4 mL/hr at 03/28/24 1128, 40 mcg/kg/min at 03/28/24 1128   senna (SENOKOT) tablet 17.2 mg, 2 tablet, Per Tube, QHS, Ricky Champion, Ricky Werner, 17.2 mg at 03/28/24 1153   sodium chloride  HYPERTONIC 3 % nebulizer solution 4 mL, 4 mL, Nebulization, Daily, Ricky Werner, Ricky Edinger, NP, 4 mL at 03/28/24 8119   thiamine  (VITAMIN B1) injection 100 mg, 100 mg, Intravenous, Daily, [name], Ricky Werner, Ricky Werner, 100 mg at 03/28/24 0906  Labs and Diagnostic Imaging   CBC:  Recent Labs  Lab 03/25/24 1655 03/25/24 1657 03/26/24 0507 03/27/24 0726 03/28/24 1055  WBC 15.0*  --  12.5* 10.3  --   NEUTROABS 10.4*  --   --   --   --   HGB 12.8*   < > 10.7* 10.6* 9.2*  HCT 40.4   < > 32.2* 32.1* 27.0*  MCV 98.1  --  95.5 95.3  --   PLT 275  --  209 175  --    < > = values in this interval not displayed.    Basic Metabolic Panel:  Lab Results  Component Value Date   NA 140 03/28/2024   K 3.6 03/28/2024   CO2 25 03/27/2024   GLUCOSE 133 (H) 03/27/2024   BUN 19 03/27/2024   CREATININE 0.90 03/27/2024   CALCIUM  7.9 (L) 03/27/2024   GFRNONAA >60 03/27/2024   GFRAA >60 08/16/2020    Lipid Panel: No results found for: "LDLCALC" HgbA1c:  Lab Results  Component Value Date   HGBA1C 5.4 03/25/2024   Urine Drug Screen:     Component Value Date/Time   LABOPIA NONE DETECTED 03/25/2024 2143   COCAINSCRNUR POSITIVE (A) 03/25/2024 2143   LABBENZ POSITIVE (A) 03/25/2024 2143   AMPHETMU POSITIVE (A) 03/25/2024 2143   THCU NONE DETECTED 03/25/2024 2143   LABBARB NONE DETECTED 03/25/2024 2143    Alcohol  Level     Component Value Date/Time   [place] <15 03/25/2024 1655   INR  Lab Results  Component Value Date   INR 1.2 03/25/2024   APTT  Lab Results  Component Value Date   APTT 24 03/25/2024     Assessment  58 year old man with past medical history significant for alcohol  abuse and benzodiazepine dependence, cigarette smoking, hypertension, alcohol  withdrawal seizures as well as concern for unprovoked seizures, on Keppra . Son found him on Wednesday (4/30) at 4:30 PM with his head slumped backwards while sitting on the couch. No evidence of trauma, no tongue bite or urinary incontinence was noted.  Initially son thought that the patient was just taking a nap but when he tried to wake him up to tell him he was leaving the house, he realized that the patient was nonresponsive and "like a rag doll" with depressed respiratory rate. He was brought to the Werner where he was evaluated as a Code Stroke via Teleneurology. Initially patient observed to have roving eye movements per Werner provider with reactive pupils; was plegic on the left but spontaneously moving the right side.  He was actively being bagged to due to hypoxia. Over the course of the Teleneurology evaluation, the patient was noted to have increasing movement on the initially plegic left side; was able to squeeze and let go bilaterally (weaker on the left than the right) but due to persistent hypoxia was intubated. Per Werner report, after intubation he was agitated, moving all 4 extremities grossly equally. Per Teleneurology  note, it was felt that his presentation was most likely due to a focal seizure with rapidly resolving post-ictal Todd'Werner paralysis. He was loaded with Keppra  2000 mg at OSH prior to transfer to [facility].  - Exam today while back on propofol sedation is similar to prior exam while on IV sedation. No clinical seizure activity seen.  - Imaging: - MRI shows concern for opoid-overdose related changes (CHANTER). Not on opioids at home and UDS was negative for opiates, but our tox screen is not sensitive for fentanyl .  - MRA negative. - UDS was positive for benzodiazepine, cocaine and amphetamine. He does have a Xanax  prescription, but no rx for Adderall. Suspect possible substance-related seizure at home versus toxidrome or a combination of both.  - LTM EEG report for today:  Intermittent slow, generalized. This study is suggestive of mild diffuse encephalopathy. No seizures or epileptiform discharges were seen throughout the recording. Today'Werner EEG is improved from yesterday, at which time the slowing was continuous.  - Impressions:  - Acute presentation most likely secondary to focal seizure with post-ictal Todd'Werner paralysis.  - Toxic metabolic encephalopathy in the setting of hyperglycemia and cocaine abuse as well as possible breakthrough seizure due to the same. - Potential for alcohol  withdrawal and benzo withdrawal as contributing factors. Lowered seizure threshold secondary to hyperglycemia. Possible missed medications secondary to memory issues - Other comorbidities: Concern for renal dysfunction (AKI vs. CKD, no recent labs) . Hypoxia Werner/p intubation Concern for underlying memory dysfunction potentially in the setting of polypharmacy / ethanol abuse  Recommendations  - Continue LTM EEG - Continue to wean sedation and extubate when able - Continue Keppra  1000 mg BID - Inpatient seizure precautions - Outpatient seizure precautions should be discussed with the patient when he is awake.  - Also will need  substance abuse counseling when awake  35 minutes spent in the neurological evaluation and management of this critically ill patient  ______________________________________________________________________   Hope Ly, Kajsa Butrum, Ricky Werner [facility]

## 2024-03-28 NOTE — Procedures (Signed)
 Patient Name: Ricky Werner  MRN: [id number]  Epilepsy Attending: Arleene Lack  Referring Physician/Provider: Tona Francis, MD  Duration: 03/27/2024 1015 to 03/28/2024 1015   Patient history: 58yo M with left-sided weakness. EEG to evaluate for seizure   Level of alertness: Awake, asleep   AEDs during EEG study: Xanax , LEV   Technical aspects: This EEG study was done with scalp electrodes positioned according to the 10-20 International system of electrode placement. Electrical activity was reviewed with band pass filter of 1-70Hz , sensitivity of 7 uV/mm, display speed of 64mm/sec with a 60Hz  notched filter applied as appropriate. EEG data were recorded continuously and digitally stored.  Video monitoring was available and reviewed as appropriate.   Description: The posterior dominant rhythm consists of 9-10 Hz activity of moderate voltage (25-35 uV) seen predominantly in posterior head regions, symmetric and reactive to eye opening and eye closing. Sleep was characterized by vertex waves, sleep spindles (12 to 14 Hz), maximal frontocentral region. EEG showed intermittent generalized 3 to 6 Hz theta-delta slowing. Hyperventilation and photic stimulation were not performed.      ABNORMALITY - Intermittent slow, generalized   IMPRESSION: This study is suggestive of mild diffuse encephalopathy. No seizures or epileptiform discharges were seen throughout the recording.   Ricky Werner O Ricky Werner

## 2024-03-28 NOTE — Progress Notes (Signed)
 NAME:  Ricky Werner, MRN:  [id number], DOB:  1966-02-25, LOS: 3 ADMISSION DATE:  03/25/2024, CONSULTATION DATE:  4/30 REFERRING MD:  Dr. Kermit Ped , CHIEF COMPLAINT:  unresponsive; possible seizure; acute respiratory failure   History of Present Illness:  Patient is a 58 year old male with pertinent PMH alcohol  use with history of alcohol  withdrawal and withdrawal seizure, tobacco/marijuana abuse, HTN, anxiety/depression presents to [facility] ED on 4/30 unresponsive.  Patient has history of alcohol  withdrawal seizures last admitted on 2018.  On Keppra .  Patient lives with son.  Family states he has not been drinking regularly since he has been out of work.  On 4/30 patient LKN an hour and a half ago and son found patient on floor unresponsive.  Patient started CPR.  EMS arrived and patient unresponsive but had a pulse.  Patient given Narcan with no effect.  Patient noted to be bradycardic and placed on supplemental O2.  Transported to [facility] ED.  On arrival BP soft.  GCS 7.  Localizes to pain on right side.  On NRB with sats mid 80s requiring intubation.  Patient required levo for hypotension.  CT head no acute findings.  Neuro consulted recommending MRI.  Gave Keppra  load.  Started on thiamine . Ethanol wnl.  UDS and Keppra  level pending.  CT chest showing peribronchovascular infiltrates versus atelectasis in L>R favoring aspiration. Wbc 15. Cultures obtained and started on rocephin /azithro. Glucose 459, co2 15, AG 21. UA, Beta H, Osm pending. Given insulin .   Pertinent ED Labs: Creat 1.96, AST 64, ALT 83,  Pertinent  Medical History   Past Medical History:  Diagnosis Date   Alcohol  abuse    Anxiety    BPH (benign prostatic hyperplasia)    Carpal tunnel syndrome on right    Chronic back pain    Depression    Headache    History of dental problems    Hypertension    Osteoarthritis    Panic attacks    Right tennis elbow    Sciatica    Seizures (HCC) 09/30/2020   Vertigo      Significant  Hospital Events: Including procedures, antibiotic start and stop dates in addition to other pertinent events   4/30 admitted w/ acute encephalopathy intubated 5/1 weaning sedation. Decr FiO2 to 60%. MRI/A 5/2 near arrest after being repositioned. Brady w pauses, hypoxemic, biting tube.   Interim History / Subjective:  Patient becomes bradycardic with suction EEG remain negative for active seizures Remained afebrile No overnight issues  Objective   Blood pressure 133/87, pulse (!) 54, temperature 98.4 F (36.9 C), temperature source Axillary, resp. rate 19, height 5\' 6"  (1.676 m), weight 89.1 kg, SpO2 99%.    Vent Mode: PRVC FiO2 (%):  [70 %-80 %] 70 % Set Rate:  [24 bmp] 24 bmp Vt Set:  [510 mL] 510 mL PEEP:  [10 cmH20] 10 cmH20 Plateau Pressure:  [21 cmH20] 21 cmH20   Intake/Output Summary (Last 24 hours) at 03/28/2024 0955 Last data filed at 03/28/2024 0800 Gross per 24 hour  Intake 1866.34 ml  Output 1300 ml  Net 566.34 ml   Filed Weights   03/26/24 0500 03/27/24 0500 03/28/24 0414  Weight: 85.6 kg 88 kg 89.1 kg    Examination: General: Crtitically ill-appearing male, orally intubated HEENT: [place]/AT, eyes anicteric.  ETT with thick greenish secretions and OGT in place Neuro: Sedated, not following commands.  Eyes are closed.  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, opiate induced Polysubstance abuse Alcohol  abuse with alcohol  withdrawal seizures Sinus bradycardia with frequent pauses especially with suction or changing position Acute  respiratory failure with hypoxia and hypercapnia Aspiration pneumonia Demand cardiac ischemia Chronic HFpEF Hypophosphatemia, resolved Opiate dependence Anxiety disorder Obesity Acute urinary retention  Avoid sedation Watch for signs of withdrawal EEG showed no active seizures Continue Keppra  1 g twice daily Patient did mention using opiate at home though urine tox is negative MRI brain is consistent with findings of CHANTER which is opiate induced brain changes Continue thiamine , folate and B12 Patient is having frequent pauses, will stop Precedex  Continue lung protective ventilation Tolerating spontaneous breathing trial Continue IV Unasyn  Respiratory culture is growing gram-positive cocci and gram-negative rods Blood culture one of the 2 bottles growing gram-positive rods in anaerobic bottle, likely contaminant Serum creatinine has improved, monitor intake and output Avoid nephrotoxic agent Serum troponins were slightly elevated Echocardiogram showed no wall motion abnormalities with grade 1 diastolic dysfunction Continue Xanax  Diet and exercise counseling as above. Patient is retaining urine, had to be straight cathed twice overnight Closely monitor bladder scan Continue doxazosin and Urecholine    Best Practice (right click and "Reselect all SmartList Selections" daily)   Diet/type: NPO w/ meds via tube DVT prophylaxis prophylactic heparin   Pressure ulcer(s): N/A GI prophylaxis: H2B Lines: N/A Foley:  N/A Code Status:  full code Last date of multidisciplinary goals of care discussion [4/30 updated son Veryl Gottron over phone]  Labs   CBC: Recent Labs  Lab 03/25/24 1655 03/25/24 1657 03/26/24 0507 03/27/24 0726  WBC 15.0*  --  12.5* 10.3  NEUTROABS 10.4*  --   --   --   HGB 12.8* 13.9 10.7* 10.6*  HCT 40.4 41.0 32.2* 32.1*  MCV 98.1  --  95.5 95.3  PLT 275  --  209 175    Basic Metabolic Panel: Recent Labs  Lab 03/25/24 1655 03/25/24 1657  03/25/24 1929 03/26/24 0507 03/26/24 1441 03/27/24 0726 03/28/24 0739  NA 135 136  --  142  --  136  --   K 4.1 4.2  --  3.9  --  4.3  --   CL 99 101  --  107  --  107  --   CO2 15*  --   --  26  --  25  --   GLUCOSE 459* 453*  --  84  --  133*  --   BUN 23* 25*  --  21*  --  19  --   CREATININE 1.96* 1.90*  --  1.25*  --  0.90  --   CALCIUM  8.6*  --   --  7.6*  --  7.9*  --   MG  --   --  1.9 1.2*  --  2.2 2.0  PHOS  --   --   --   --  2.3* 1.9* 2.5   GFR: Estimated Creatinine Clearance: 91.2 mL/min (by C-G formula based on SCr of 0.9 mg/dL). Recent Labs  Lab 03/25/24 1655 03/25/24 1929 03/26/24 0507 03/26/24 0844 03/27/24 0726 03/27/24 1238 03/27/24 2059  WBC 15.0*  --  12.5*  --  10.3  --   --   LATICACIDVEN  --    < > 2.5* 4.8*  --  1.3 0.9   < > = values in this interval not displayed.    Liver Function Tests: Recent Labs  Lab 03/25/24 1655 03/26/24 0507 03/27/24 0726  AST 64* 48* 33  ALT 83* 59* 48*  ALKPHOS 39 24* 23*  BILITOT 0.7 1.0 1.1  PROT 6.9 4.7* 5.0*  ALBUMIN 3.9 2.5* 2.5*   No results for input(s): "LIPASE", "AMYLASE" in the last 168 hours. Recent Labs  Lab 03/25/24 2327  AMMONIA 34    ABG    Component Value Date/Time   PHART 7.34 (L) 03/26/2024 0304   PCO2ART 52 (H) 03/26/2024 0304   PO2ART 76 (L) 03/26/2024 0304   HCO3 28.6 (H) 03/26/2024 0304   TCO2 19 (L) 03/25/2024 1657   ACIDBASEDEF 3.2 (H) 03/25/2024 2330   O2SAT 97.5 03/26/2024 0304     Coagulation Profile: Recent Labs  Lab 03/25/24 1655  INR 1.2    Cardiac Enzymes: Recent Labs  Lab 03/25/24 1929 03/27/24 0726  CKTOTAL 401* 543*    HbA1C: Hgb A1c MFr Bld  Date/Time Value Ref Range Status  03/25/2024 07:29 PM 5.4 4.8 - 5.6 % Final    Comment:    (NOTE) Pre diabetes:          5.7%-6.4%  Diabetes:              >6.4%  Glycemic control for   <7.0% adults with diabetes     CBG: Recent Labs  Lab 03/27/24 1536 03/27/24 1939 03/27/24 2329 03/28/24 0320  03/28/24 0721  GLUCAP 127* 130* 139* 114* 133*    The patient is critically ill due to acute toxic encephalopathy/focal seizures.  Acute respiratory failure with hypoxia.  Critical care was necessary to treat or prevent imminent or life-threatening deterioration.  Critical care was time spent personally by me on the following activities: development of treatment plan with patient and/or surrogate as well as nursing, discussions with consultants, evaluation of patient's response to treatment, examination of patient, obtaining history from patient or surrogate, ordering and performing treatments and interventions, ordering and review of laboratory studies, ordering and review of radiographic studies, pulse oximetry, re-evaluation of patient's condition and participation in multidisciplinary rounds.   During this encounter critical care time was devoted to patient care services described in this note for 42 minutes.     Trevor Fudge, MD [facility] See Amion for pager If no response to pager, please call [phone] until 7pm After 7pm, Please call E-link [phone]

## 2024-03-28 NOTE — Progress Notes (Addendum)
 eLink Physician-Brief Progress Note Patient Name: Ricky Werner DOB: December 02, 1965 MRN: [id number]   Date of Service  03/28/2024  HPI/Events of Note  58 year old male with alcohol  abuse and alcohol  withdrawal seizure who presented after he was found unresponsive, noted to have tonic-clonic seizure activity, complicated by acute respiratory failure with hypoxia and hypercapnia with aspiration pneumonia   Patient is getting fentanyl  intermittently for dyssynchrony and agitation.  Get somewhat hypotensive with administration  eICU Interventions  Continue fentanyl  for management of dyssynchrony and agitation.  Longer acting drugs are likely to obscure his neurological exam  Add norepinephrine  peripheral   [phone] -patient has had severe urinary retention.  Has excess of 500 cc in his bladder x 2 within the past 24 hours.  Will instate standard urinary retention protocol.  If he requires greater than 3 I/O caths within 24 hours, anticipate Foley catheter placement.  Start doxazosin per tube  Intervention Category Minor Interventions: Agitation / anxiety - evaluation and management  Breven Guidroz 03/28/2024, 12:33 AM

## 2024-03-28 NOTE — Plan of Care (Signed)

## 2024-03-28 NOTE — Plan of Care (Signed)

## 2024-03-28 NOTE — Progress Notes (Signed)
 Blood cultures from [facility] resulted in Gram + coccid , called into Elink MD.  5/3 @1 :55AM

## 2024-03-28 NOTE — Progress Notes (Signed)
 LTM maint complete - no skin breakdown seen.  Serviced O1 and P3. [facility] monitored, Event button test confirmed by [facility].

## 2024-03-29 ENCOUNTER — Inpatient Hospital Stay (HOSPITAL_COMMUNITY)

## 2024-03-29 DIAGNOSIS — G8384 Todd's paralysis (postepileptic): Secondary | ICD-10-CM | POA: Diagnosis not present

## 2024-03-29 DIAGNOSIS — J9601 Acute respiratory failure with hypoxia: Secondary | ICD-10-CM | POA: Diagnosis not present

## 2024-03-29 DIAGNOSIS — R001 Bradycardia, unspecified: Secondary | ICD-10-CM | POA: Diagnosis not present

## 2024-03-29 DIAGNOSIS — R739 Hyperglycemia, unspecified: Secondary | ICD-10-CM | POA: Diagnosis not present

## 2024-03-29 DIAGNOSIS — R569 Unspecified convulsions: Secondary | ICD-10-CM | POA: Diagnosis not present

## 2024-03-29 DIAGNOSIS — G928 Other toxic encephalopathy: Secondary | ICD-10-CM | POA: Diagnosis not present

## 2024-03-29 LAB — GLUCOSE, CAPILLARY
Glucose-Capillary: 107 mg/dL — ABNORMAL HIGH (ref 70–99)
Glucose-Capillary: 93 mg/dL (ref 70–99)
Glucose-Capillary: 95 mg/dL (ref 70–99)
Glucose-Capillary: 93 mg/dL (ref 70–99)
Glucose-Capillary: 125 mg/dL — ABNORMAL HIGH (ref 70–99)
Glucose-Capillary: 98 mg/dL (ref 70–99)

## 2024-03-29 LAB — TRIGLYCERIDES: Triglycerides: 179 mg/dL — ABNORMAL HIGH (ref <150)

## 2024-03-29 LAB — POCT I-STAT 7, (LYTES, BLD GAS, ICA,H+H)
Acid-Base Excess: 2 mmol/L (ref 0.0–2.0)
Bicarbonate: 27.6 mmol/L (ref 20.0–28.0)
Calcium, Ion: 1.26 mmol/L (ref 1.15–1.40)
HCT: 28 % — ABNORMAL LOW (ref 39.0–52.0)
Hemoglobin: 9.5 g/dL — ABNORMAL LOW (ref 13.0–17.0)
O2 Saturation: 91 %
Patient temperature: 98.4
Potassium: 3.9 mmol/L (ref 3.5–5.1)
Sodium: 140 mmol/L (ref 135–145)
TCO2: 29 mmol/L (ref 22–32)
pCO2 arterial: 46.3 mmHg (ref 32–48)
pH, Arterial: 7.383 (ref 7.35–7.45)
pO2, Arterial: 64 mmHg — ABNORMAL LOW (ref 83–108)

## 2024-03-29 MED ORDER — SODIUM CHLORIDE 3 % IN NEBU
4.0000 mL | INHALATION_SOLUTION | Freq: Every day | RESPIRATORY_TRACT | Status: AC
  Administered 2024-03-29 – 2024-03-31 (×3): 4 mL via RESPIRATORY_TRACT
  Filled 2024-03-29 (×3): qty 4

## 2024-03-29 MED ORDER — GADOBUTROL 1 MMOL/ML IV SOLN
9.0000 mL | Freq: Once | INTRAVENOUS | Status: AC | PRN
  Administered 2024-03-29: 9 mL via INTRAVENOUS

## 2024-03-29 MED ORDER — GADOBUTROL 1 MMOL/ML IV SOLN: 9.0000 mL | Freq: Once | INTRAVENOUS | Status: DC | PRN

## 2024-03-29 NOTE — Plan of Care (Signed)
  Problem: Clinical Measurements: Goal: Ability to maintain clinical measurements within normal limits will improve Outcome: Progressing Goal: Diagnostic test results will improve Outcome: Progressing Goal: Respiratory complications will improve Outcome: Progressing Goal: Cardiovascular complication will be avoided Outcome: Progressing   Problem: Activity: Goal: Risk for activity intolerance will decrease Outcome: Progressing   Problem: Nutrition: Goal: Adequate nutrition will be maintained Outcome: Progressing   Problem: Coping: Goal: Level of anxiety will decrease Outcome: Progressing   Problem: Elimination: Goal: Will not experience complications related to bowel motility Outcome: Progressing   Problem: Pain Managment: Goal: General experience of comfort will improve and/or be controlled Outcome: Progressing   Problem: Safety: Goal: Ability to remain free from injury will improve Outcome: Progressing   Problem: Skin Integrity: Goal: Risk for impaired skin integrity will decrease Outcome: Progressing

## 2024-03-29 NOTE — Progress Notes (Signed)
 LTM maint complete - no skin breakdown seen serviced T8  [facility] monitored, Event button test confirmed by [facility].

## 2024-03-29 NOTE — Progress Notes (Signed)
 NEUROLOGY CONSULT FOLLOW UP NOTE   Date of service: Mar 29, 2024 Patient Name: Ricky Werner MRN:  [id number] DOB:  [birth year]  Interval Hx/subjective  Continues to be intubated. Sedated on propofol at a rate of 40 due to episodes of bradycardia during turning by staff.   Vitals   Vitals:   03/29/24 0300 03/29/24 0400 03/29/24 0500 03/29/24 0600  BP: 110/72 112/73 109/73 107/72  Pulse: (!) 51 (!) 52 (!) 53 (!) 53  Resp: (!) 24 (!) 24 (!) 24 (!) 24  Temp:  99 F (37.2 C)    TempSrc:  Axillary    SpO2: 95% 95% 95% 95%  Weight:   92 kg   Height:         Body mass index is 32.74 kg/m.  Physical Exam   Constitutional: Well-developed and well-nourished  HENT: ETT in place Head: Normocephalic. C-collar is on.   Respiratory: Intubated Skin: WDI.    Neurologic Examination    Ment: Patient is intubated. Sedated on propofol at a rate of 40. Did slightly open eyes and glance at examiner to calling of his name combined with light sternal rub. Also moved BUE and BLE transiently while appearing to be trying to shift in bed   CN: With passive eyelid opening, no nystagmus. Eyes are midline. Briefly glanced at examiner. Trace blink to threat 1x on the right. No roving EOM. Face is flaccidly symmetric.  Motor/Sensory: Flaccid tone x 4. Weak movement to light noxious stimuli and light sternal rub. Aaron Aas  Reflexes:  3+ bilateral brachioradialis, biceps and patellae. 2+ bilateral achilles. Toes mute. Absent Hoffman's sign bilaterally. 5 beats of clonus with forced dorsiflexion of right foot.   Cerebellar: Unable to assess Gait: Unable to assess  Medications  Current Facility-Administered Medications:    ALPRAZolam  (XANAX ) tablet 1 mg, 1 mg, Per Tube, TID, Bowser, Darin Edinger, NP, 1 mg at 03/28/24 2245   Ampicillin -Sulbactam (UNASYN ) 3 g in sodium chloride  0.9 % 100 mL IVPB, 3 g, Intravenous, Q6H, Ananias Balls, RPH, Last Rate: 200 mL/hr at 03/29/24 0552, 3 g at 03/29/24 4098   bethanechol   (URECHOLINE ) tablet 25 mg, 25 mg, Per Tube, TID, Trevor Fudge, MD, 25 mg at 03/28/24 2245   Chlorhexidine  Gluconate Cloth 2 % PADS 6 each, 6 each, Topical, Q2200, [name], John D, PA-C, 6 each at 03/29/24 0051   cyanocobalamin  (VITAMIN B12) tablet 1,000 mcg, 1,000 mcg, Per Tube, Daily, Oralee Billow I, RPH, 1,000 mcg at 03/28/24 0905   docusate (COLACE) 50 MG/5ML liquid 100 mg, 100 mg, Per Tube, BID, [name], John D, PA-C, 100 mg at 03/28/24 2245   doxazosin (CARDURA) tablet 2 mg, 2 mg, Per Tube, Daily, Paliwal, [name], MD, 2 mg at 03/28/24 0906   escitalopram  (LEXAPRO ) tablet 10 mg, 10 mg, Per Tube, QHS, Bowser, Grace E, NP, 10 mg at 03/28/24 2245   famotidine  (PEPCID ) tablet 20 mg, 20 mg, Per Tube, BID, [name], John D, PA-C, 20 mg at 03/28/24 2245   feeding supplement (OSMOLITE 1.5 CAL) liquid 1,000 mL, 1,000 mL, Per Tube, Continuous, Desai, Nikita S, MD, Last Rate: 50 mL/hr at 03/29/24 0500, Infusion Verify at 03/29/24 0500   feeding supplement (PROSource TF20) liquid 60 mL, 60 mL, Per Tube, BID, Desai, Nikita S, MD, 60 mL at 03/28/24 2245   fentaNYL  (SUBLIMAZE ) injection 50 mcg, 50 mcg, Intravenous, Q15 min PRN, Bowser, Darin Edinger, NP, 50 mcg at 03/28/24 1125   fentaNYL  (SUBLIMAZE ) injection 50-200 mcg, 50-200 mcg, Intravenous, Q30 min PRN,  Delories Fetter, NP, 100 mcg at 03/29/24 1610   folic acid  (FOLVITE ) tablet 1 mg, 1 mg, Per Tube, Daily, [name], John D, PA-C, 1 mg at 03/28/24 9604   heparin  injection 5,000 Units, 5,000 Units, Subcutaneous, Q8H, [name], John D, PA-C, 5,000 Units at 03/29/24 5409   insulin  aspart (novoLOG ) injection 0-9 Units, 0-9 Units, Subcutaneous, Q4H, [name], John D, PA-C, 1 Units at 03/28/24 1621   ipratropium-albuterol  (DUONEB) 0.5-2.5 (3) MG/3ML nebulizer solution 3 mL, 3 mL, Nebulization, Q4H PRN, Bowser, Darin Edinger, NP, 3 mL at 03/29/24 0733   [COMPLETED] levETIRAcetam  (KEPPRA ) IVPB 1000 mg/100 mL premix, 2,000 mg, Intravenous, Once, Stopped at 03/25/24 1941 **FOLLOWED BY**  levETIRAcetam  (KEPPRA ) IVPB 1000 mg/100 mL premix, 1,000 mg, Intravenous, Q12H, [name], RPH, Last Rate: 400 mL/hr at 03/29/24 8119, 1,000 mg at 03/29/24 1478   multivitamin with minerals tablet 1 tablet, 1 tablet, Per Tube, Daily, Casimiro Cleaves, PA-C, 1 tablet at 03/28/24 2956   norepinephrine  (LEVOPHED ) 4mg  in (0.016 mg/mL) premix infusion, 2-10 mcg/min, Intravenous, Titrated, Paliwal, [name], MD, Stopped at 03/28/24 1900   Oral care mouth rinse, 15 mL, Mouth Rinse, Q2H, [name], John D, PA-C, 15 mL at 03/29/24 2130   Oral care mouth rinse, 15 mL, Mouth Rinse, PRN, Casimiro Cleaves, PA-C   oxyCODONE  (Oxy IR/ROXICODONE ) immediate release tablet 5-10 mg, 5-10 mg, Per Tube, Q4H PRN, Trevor Fudge, MD   polyethylene glycol (MIRALAX  / GLYCOLAX ) packet 17 g, 17 g, Per Tube, Daily, Casimiro Cleaves, PA-C, 17 g at 03/28/24 8657   polyethylene glycol (MIRALAX  / GLYCOLAX ) packet 17 g, 17 g, Per Tube, Daily PRN, Oralee Billow I, RPH   propofol (DIPRIVAN) 1000 MG/100ML infusion, 5-80 mcg/kg/min, Intravenous, Titrated, Trevor Fudge, MD, Last Rate: 16.04 mL/hr at 03/29/24 0500, 30 mcg/kg/min at 03/29/24 0500   senna (SENOKOT) tablet 17.2 mg, 2 tablet, Per Tube, QHS, Cannon Champion, MD, 17.2 mg at 03/28/24 1153   thiamine  (VITAMIN B1) injection 100 mg, 100 mg, Intravenous, Daily, Casimiro Cleaves, PA-C, 100 mg at 03/28/24 0906  Labs and Diagnostic Imaging   CBC:  Recent Labs  Lab 03/25/24 1655 03/25/24 1657 03/26/24 0507 03/27/24 0726 03/28/24 1055  WBC 15.0*  --  12.5* 10.3  --   NEUTROABS 10.4*  --   --   --   --   HGB 12.8*   < > 10.7* 10.6* 9.2*  HCT 40.4   < > 32.2* 32.1* 27.0*  MCV 98.1  --  95.5 95.3  --   PLT 275  --  209 175  --    < > = values in this interval not displayed.    Basic Metabolic Panel:  Lab Results  Component Value Date   NA 140 03/28/2024   K 3.6 03/28/2024   CO2 25 03/27/2024   GLUCOSE 133 (H) 03/27/2024   BUN 19 03/27/2024   CREATININE 0.90  03/27/2024   CALCIUM  7.9 (L) 03/27/2024   GFRNONAA >60 03/27/2024   GFRAA >60 08/16/2020   Lipid Panel: No results found for: "LDLCALC" HgbA1c:  Lab Results  Component Value Date   HGBA1C 5.4 03/25/2024   Urine Drug Screen:     Component Value Date/Time   LABOPIA NONE DETECTED 03/25/2024 2143   COCAINSCRNUR POSITIVE (A) 03/25/2024 2143   LABBENZ POSITIVE (A) 03/25/2024 2143   AMPHETMU POSITIVE (A) 03/25/2024 2143   THCU NONE DETECTED 03/25/2024 2143   LABBARB NONE DETECTED 03/25/2024 2143    Alcohol  Level  Component Value Date/Time   [facility] <15 03/25/2024 1655   INR  Lab Results  Component Value Date   INR 1.2 03/25/2024   APTT  Lab Results  Component Value Date   APTT 24 03/25/2024     Assessment  58 year old man with past medical history significant for alcohol  abuse and benzodiazepine dependence, cigarette smoking, hypertension, alcohol  withdrawal seizures as well as concern for unprovoked seizures, on Keppra  outpatient. Son found him on Wednesday (4/30) at 4:30 PM with his head slumped backwards while sitting on the couch. No evidence of trauma, no tongue bite or urinary incontinence was noted.  Initially son thought that the patient was just taking a nap but when he tried to wake him up to tell him he was leaving the house, he realized that the patient was nonresponsive and "like a rag doll" with depressed respiratory rate. He was brought to the ED where he was evaluated as a Code Stroke via Teleneurology. Initially patient observed to have roving eye movements per ED provider with reactive pupils; was plegic on the left but spontaneously moving the right side.  He was actively being bagged to due to hypoxia. Over the course of the Teleneurology evaluation, the patient was noted to have increasing movement on the initially plegic left side; was able to squeeze and let go bilaterally (weaker on the left than the right) but due to persistent hypoxia was intubated. Per ED  report, after intubation he was agitated, moving all 4 extremities grossly equally. Per Teleneurology note, it was felt that his presentation was most likely due to a focal seizure with rapidly resolving post-ictal Todd's paralysis. He was loaded with Keppra  2000 mg at OSH prior to transfer to [facility].  - Exam today while on propofol sedation at a rate of 40 is similar to prior exams while on IV sedation. No clinical seizure activity seen.  - Imaging: - MRI shows concern for opoid-overdose related changes (CHANTER). Not on opioids at home and UDS was negative for opiates, but our tox screen is not sensitive for fentanyl .  - MRA negative. - UDS was positive for benzodiazepine, cocaine and amphetamine. He does have a Xanax  prescription, but no rx for Adderall. Suspect possible substance-related seizure at home versus toxidrome or a combination of both.  - LTM EEG report for today AM: Continuous slow, generalized. This study is suggestive of moderate to severe diffuse encephalopathy, likely related to sedation. No seizures or epileptiform discharges were seen throughout the recording. - Impressions:  - Acute presentation most likely secondary to focal seizure with post-ictal Todd's paralysis.  - Toxic metabolic encephalopathy in the setting of hyperglycemia and cocaine abuse as well as possible breakthrough seizure due to the same. - Potential for alcohol  withdrawal and benzo withdrawal as contributing factors. Lowered seizure threshold secondary to hyperglycemia. Possible missed medications secondary to memory issues. - Possible cervical spinal cord lesion due to frequent bradycardia triggered by movement and upper + lower extremity hyperreflexia. Per the literature, severe bradycardia as a complication of acute cervical spinal cord injury is common as a result of post-injury imbalance in the autonomic nervous system caused by dissociation of spinal cardiac and vasomotor sympathetic fibers, while the  parasympathetic fibers that travel with vagus nerve remain intact. - Other comorbidities: Concern for renal dysfunction (AKI vs. CKD, no recent labs) . Hypoxia s/p intubation. Concern for underlying memory dysfunction potentially in the setting of polypharmacy / ethanol abuse   Recommendations  - MRI cervical spine with and without contrast (  ordered) - Continue LTM EEG - Continue to wean sedation and extubate when able - Continue Keppra  1000 mg BID - Inpatient seizure precautions - Outpatient seizure precautions should be discussed with the patient when he is awake.  - Also will need substance abuse counseling when awake  Addendum: MRI cervical spine w/wo contrast: Preliminary review of the images reveals no spinal cord compression or intrinsic spinal cord lesion. Official report pending.   40 minutes spent in the neurological evaluation and management of this critically ill patient ______________________________________________________________________   Hope Ly, Ayssa Bentivegna, MD [facility]

## 2024-03-29 NOTE — Progress Notes (Signed)
 LTM maint complete - no skin breakdown under:  A1,Fp2.

## 2024-03-29 NOTE — Progress Notes (Signed)
 Pt transportec from 4N23 to MRI and back by RT and RN x2 w/o complications

## 2024-03-29 NOTE — Procedures (Addendum)
 Patient Name: MARTRELL RUMMLER  MRN: [id number]  Epilepsy Attending: Arleene Lack  Referring Physician/Provider: Tona Francis, MD  Duration: 03/28/2024 1015 to 03/29/2024 1015   Patient history: 58yo M with left-sided weakness. EEG to evaluate for seizure   Level of alertness: comatose/lethargic   AEDs during EEG study: Xanax , LEV, propofol   Technical aspects: This EEG study was done with scalp electrodes positioned according to the 10-20 International system of electrode placement. Electrical activity was reviewed with band pass filter of 1-70Hz , sensitivity of 7 uV/mm, display speed of 44mm/sec with a 60Hz  notched filter applied as appropriate. EEG data were recorded continuously and digitally stored.  Video monitoring was available and reviewed as appropriate.   Description: EEG showed continuous generalized 3 to 6 Hz theta-delta slowing admixed with 12-14hz  beta activity. Hyperventilation and photic stimulation were not performed.      ABNORMALITY - Continuous slow, generalized   IMPRESSION: This study is suggestive of moderate to severe diffuse encephalopathy, likely related to sedation. No seizures or epileptiform discharges were seen throughout the recording.   Damyan Corne O Anedra Penafiel

## 2024-03-29 NOTE — Progress Notes (Addendum)
 NAME:  Ricky Werner, MRN:  [id number], DOB:  05-16-1966, LOS: 4 ADMISSION DATE:  03/25/2024, CONSULTATION DATE:  4/30 REFERRING MD:  Dr. Kermit Ped , CHIEF COMPLAINT:  unresponsive; possible seizure; acute respiratory failure   History of Present Illness:  Patient is a 58 year old male with pertinent PMH alcohol  use with history of alcohol  withdrawal and withdrawal seizure, tobacco/marijuana abuse, HTN, anxiety/depression presents to [facility] ED on 4/30 unresponsive.  Patient has history of alcohol  withdrawal seizures last admitted on 2018.  On Keppra .  Patient lives with son.  Family states he has not been drinking regularly since he has been out of work.  On 4/30 patient LKN an hour and a half ago and son found patient on floor unresponsive.  Patient started CPR.  EMS arrived and patient unresponsive but had a pulse.  Patient given Narcan with no effect.  Patient noted to be bradycardic and placed on supplemental O2.  Transported to [facility] ED.  On arrival BP soft.  GCS 7.  Localizes to pain on right side.  On NRB with sats mid 80s requiring intubation.  Patient required levo for hypotension.  CT head no acute findings.  Neuro consulted recommending MRI.  Gave Keppra  load.  Started on thiamine . Ethanol wnl.  UDS and Keppra  level pending.  CT chest showing peribronchovascular infiltrates versus atelectasis in L>R favoring aspiration. Wbc 15. Cultures obtained and started on rocephin /azithro. Glucose 459, co2 15, AG 21. UA, Beta H, Osm pending. Given insulin .   Pertinent ED Labs: Creat 1.96, AST 64, ALT 83,  Pertinent  Medical History   Past Medical History:  Diagnosis Date   Alcohol  abuse    Anxiety    BPH (benign prostatic hyperplasia)    Carpal tunnel syndrome on right    Chronic back pain    Depression    Headache    History of dental problems    Hypertension    Osteoarthritis    Panic attacks    Right tennis elbow    Sciatica    Seizures (HCC) 09/30/2020   Vertigo      Significant  Hospital Events: Including procedures, antibiotic start and stop dates in addition to other pertinent events   4/30 admitted w/ acute encephalopathy intubated 5/1 weaning sedation. Decr FiO2 to 60%. MRI/A 5/2 near arrest after being repositioned. Brady w pauses, hypoxemic, biting tube.  5/3 continue to have thick secretions via ET tube, gets bradycardic with cough and suction  Interim History / Subjective:  Remain afebrile Continued to have thick secretions via ET tube FiO2 was titrated down to 50% and PEEP of 8  Objective   Blood pressure 96/62, pulse 73, temperature 98.4 F (36.9 C), temperature source Axillary, resp. rate (!) 24, height 5\' 6"  (1.676 m), weight 92 kg, SpO2 94%.    Vent Mode: PRVC FiO2 (%):  [50 %-100 %] 50 % Set Rate:  [24 bmp] 24 bmp Vt Set:  [510 mL] 510 mL PEEP:  [8 cmH20] 8 cmH20 Plateau Pressure:  [20 cmH20-21 cmH20] 20 cmH20   Intake/Output Summary (Last 24 hours) at 03/29/2024 1054 Last data filed at 03/29/2024 0800 Gross per 24 hour  Intake 2377.34 ml  Output 1090 ml  Net 1287.34 ml   Filed Weights   03/27/24 0500 03/28/24 0414 03/29/24 0500  Weight: 88 kg 89.1 kg 92 kg    Examination: General: Crtitically ill-appearing male, orally intubated HEENT: [place]/AT, eyes anicteric.  ETT and OGT in place.  EEG leads in place Neuro: Sedated, not following  commands.  Eyes are closed.  Pupils 3 mm bilateral reactive to light Chest: Coarse breath sounds, no wheezes or rhonchi Heart: Regular rate and rhythm, no murmurs or gallops.  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, opiate induced Polysubstance  abuse Alcohol  abuse with alcohol  withdrawal seizures Sinus bradycardia with frequent pauses especially with suction or changing position Acute respiratory failure with hypoxia and hypercapnia Aspiration pneumonia Demand cardiac ischemia Chronic HFpEF Opiate dependence Anxiety disorder Exaggerated vagal tone Obesity Acute urinary retention  Watch for signs of withdrawal Continue Keppra  1 g twice daily Patient did mention using opiate at home though urine tox is negative MRI brain is consistent with findings of CHANTER which is opiate induced brain changes Continue thiamine , folate and B12 Off Precedex  Neurology is following EEG is negative for active seizures Continue propofol and fentanyl  for now Continue lung protective ventilation Tolerating spontaneous breathing trial FiO2 was titrated down to 50% and PEEP of 8 Repeat ABGs pending Continue IV Unasyn  to complete 7 days therapy Respiratory culture is growing gram-positive cocci and gram-negative rods Blood culture one of the 2 bottles growing gram-positive rods in anaerobic bottle, likely contaminant Serum creatinine has improved, monitor intake and output Avoid nephrotoxic agent Serum troponins were slightly elevated Echocardiogram showed no wall motion abnormalities with grade 1 diastolic dysfunction Continue Xanax  Patient is having frequent pauses due to exaggerated vagal tone, per neurology has hyperreflexia, CT cervical spine showing foraminal narrowing, will get MRI spine to rule out nerve impingement Diet and exercise counseling as appropriate Closely monitor bladder scan Continue doxazosin and Urecholine    Best Practice (right click and "Reselect all SmartList Selections" daily)   Diet/type: NPO w/ meds via tube DVT prophylaxis prophylactic heparin   Pressure ulcer(s): N/A GI prophylaxis: H2B Lines: N/A Foley:  N/A Code Status:  full code Last date of multidisciplinary goals of care discussion [4/30 updated  son Veryl Gottron over phone]  Labs   CBC: Recent Labs  Lab 03/25/24 1655 03/25/24 1657 03/26/24 0507 03/27/24 0726 03/28/24 1055  WBC 15.0*  --  12.5* 10.3  --   NEUTROABS 10.4*  --   --   --   --   HGB 12.8* 13.9 10.7* 10.6* 9.2*  HCT 40.4 41.0 32.2* 32.1* 27.0*  MCV 98.1  --  95.5 95.3  --   PLT 275  --  209 175  --     Basic Metabolic Panel: Recent Labs  Lab 03/25/24 1655 03/25/24 1657 03/25/24 1929 03/26/24 0507 03/26/24 1441 03/27/24 0726 03/28/24 0739 03/28/24 1055  NA 135 136  --  142  --  136  --  140  K 4.1 4.2  --  3.9  --  4.3  --  3.6  CL 99 101  --  107  --  107  --   --   CO2 15*  --   --  26  --  25  --   --   GLUCOSE 459* 453*  --  84  --  133*  --   --   BUN 23* 25*  --  21*  --  19  --   --   CREATININE 1.96* 1.90*  --  1.25*  --  0.90  --   --   CALCIUM  8.6*  --   --  7.6*  --  7.9*  --   --   MG  --   --  1.9 1.2*  --  2.2 2.0  --   PHOS  --   --   --   --  2.3* 1.9* 2.5  --    GFR: Estimated Creatinine Clearance: 92.7 mL/min (by C-G formula based on SCr of 0.9 mg/dL). Recent Labs  Lab 03/25/24 1655 03/25/24 1929 03/26/24 0507 03/26/24 0844 03/27/24 0726 03/27/24 1238 03/27/24 2059  WBC 15.0*  --  12.5*  --  10.3  --   --   LATICACIDVEN  --    < > 2.5* 4.8*  --  1.3 0.9   < > = values in this interval not displayed.    Liver Function Tests: Recent Labs  Lab 03/25/24 1655 03/26/24 0507 03/27/24 0726  AST 64* 48* 33  ALT 83* 59* 48*  ALKPHOS 39 24* 23*  BILITOT 0.7 1.0 1.1  PROT 6.9 4.7* 5.0*  ALBUMIN 3.9 2.5* 2.5*   No results for input(s): "LIPASE", "AMYLASE" in the last 168 hours. Recent Labs  Lab 03/25/24 2327  AMMONIA 34    ABG    Component Value Date/Time   PHART 7.351 03/28/2024 1055   PCO2ART 50.1 (H) 03/28/2024 1055   PO2ART 68 (L) 03/28/2024 1055   HCO3 27.7 03/28/2024 1055   TCO2 29 03/28/2024 1055   ACIDBASEDEF 3.2 (H) 03/25/2024 2330   O2SAT 92 03/28/2024 1055     Coagulation Profile: Recent Labs   Lab 03/25/24 1655  INR 1.2    Cardiac Enzymes: Recent Labs  Lab 03/25/24 1929 03/27/24 0726  CKTOTAL 401* 543*    HbA1C: Hgb A1c MFr Bld  Date/Time Value Ref Range Status  03/25/2024 07:29 PM 5.4 4.8 - 5.6 % Final    Comment:    (NOTE) Pre diabetes:          5.7%-6.4%  Diabetes:              >6.4%  Glycemic control for   <7.0% adults with diabetes     CBG: Recent Labs  Lab 03/28/24 1526 03/28/24 1939 03/28/24 2344 03/29/24 0327 03/29/24 0709  GLUCAP 141* 108* 113* 107* 93    The patient is critically ill due to acute toxic encephalopathy/focal seizures.  Acute respiratory failure with hypoxia.  Critical care was necessary to treat or prevent imminent or life-threatening deterioration.  Critical care was time spent personally by me on the following activities: development of treatment plan with patient and/or surrogate as well as nursing, discussions with consultants, evaluation of patient's response to treatment, examination of patient, obtaining history from patient or surrogate, ordering and performing treatments and interventions, ordering and review of laboratory studies, ordering and review of radiographic studies, pulse oximetry, re-evaluation of patient's condition and participation in multidisciplinary rounds.   During this encounter critical care time was devoted to patient care services described in this note for 39 minutes.     Trevor Fudge, MD [facility] See Amion for pager If no response to pager, please call [phone] until 7pm After 7pm, Please call E-link [phone]

## 2024-03-30 ENCOUNTER — Inpatient Hospital Stay (HOSPITAL_COMMUNITY)

## 2024-03-30 DIAGNOSIS — G928 Other toxic encephalopathy: Secondary | ICD-10-CM | POA: Diagnosis not present

## 2024-03-30 DIAGNOSIS — R0902 Hypoxemia: Secondary | ICD-10-CM

## 2024-03-30 DIAGNOSIS — R339 Retention of urine, unspecified: Secondary | ICD-10-CM

## 2024-03-30 DIAGNOSIS — J9601 Acute respiratory failure with hypoxia: Secondary | ICD-10-CM | POA: Diagnosis not present

## 2024-03-30 DIAGNOSIS — G8384 Todd's paralysis (postepileptic): Secondary | ICD-10-CM | POA: Diagnosis not present

## 2024-03-30 DIAGNOSIS — I5032 Chronic diastolic (congestive) heart failure: Secondary | ICD-10-CM

## 2024-03-30 DIAGNOSIS — R739 Hyperglycemia, unspecified: Secondary | ICD-10-CM | POA: Diagnosis not present

## 2024-03-30 DIAGNOSIS — F10139 Alcohol abuse with withdrawal, unspecified: Secondary | ICD-10-CM

## 2024-03-30 DIAGNOSIS — J189 Pneumonia, unspecified organism: Secondary | ICD-10-CM

## 2024-03-30 DIAGNOSIS — N179 Acute kidney failure, unspecified: Secondary | ICD-10-CM

## 2024-03-30 DIAGNOSIS — G959 Disease of spinal cord, unspecified: Secondary | ICD-10-CM

## 2024-03-30 DIAGNOSIS — N289 Disorder of kidney and ureter, unspecified: Secondary | ICD-10-CM

## 2024-03-30 DIAGNOSIS — R569 Unspecified convulsions: Secondary | ICD-10-CM | POA: Diagnosis not present

## 2024-03-30 LAB — CBC WITH DIFFERENTIAL/PLATELET
Abs Immature Granulocytes: 0.32 10*3/uL — ABNORMAL HIGH (ref 0.00–0.07)
Basophils Absolute: 0.1 10*3/uL (ref 0.0–0.1)
Basophils Relative: 1 %
Eosinophils Absolute: 0.5 10*3/uL (ref 0.0–0.5)
Eosinophils Relative: 5 %
HCT: 31.8 % — ABNORMAL LOW (ref 39.0–52.0)
Hemoglobin: 10.3 g/dL — ABNORMAL LOW (ref 13.0–17.0)
Immature Granulocytes: 4 %
Lymphocytes Relative: 27 %
Lymphs Abs: 2.3 10*3/uL (ref 0.7–4.0)
MCH: 31.5 pg (ref 26.0–34.0)
MCHC: 32.4 g/dL (ref 30.0–36.0)
MCV: 97.2 fL (ref 80.0–100.0)
Monocytes Absolute: 1.1 10*3/uL — ABNORMAL HIGH (ref 0.1–1.0)
Monocytes Relative: 13 %
Neutro Abs: 4.4 10*3/uL (ref 1.7–7.7)
Neutrophils Relative %: 50 %
Platelets: 221 10*3/uL (ref 150–400)
RBC: 3.27 MIL/uL — ABNORMAL LOW (ref 4.22–5.81)
RDW: 13.8 % (ref 11.5–15.5)
WBC: 8.7 10*3/uL (ref 4.0–10.5)
nRBC: 0 % (ref 0.0–0.2)

## 2024-03-30 LAB — POCT I-STAT 7, (LYTES, BLD GAS, ICA,H+H)
Acid-Base Excess: 3 mmol/L — ABNORMAL HIGH (ref 0.0–2.0)
Bicarbonate: 28 mmol/L (ref 20.0–28.0)
Calcium, Ion: 1.26 mmol/L (ref 1.15–1.40)
HCT: 26 % — ABNORMAL LOW (ref 39.0–52.0)
Hemoglobin: 8.8 g/dL — ABNORMAL LOW (ref 13.0–17.0)
O2 Saturation: 90 %
Patient temperature: 98.2
Potassium: 3.5 mmol/L (ref 3.5–5.1)
Sodium: 139 mmol/L (ref 135–145)
TCO2: 29 mmol/L (ref 22–32)
pCO2 arterial: 45.7 mmHg (ref 32–48)
pH, Arterial: 7.395 (ref 7.35–7.45)
pO2, Arterial: 60 mmHg — ABNORMAL LOW (ref 83–108)

## 2024-03-30 LAB — GLUCOSE, CAPILLARY
Glucose-Capillary: 106 mg/dL — ABNORMAL HIGH (ref 70–99)
Glucose-Capillary: 109 mg/dL — ABNORMAL HIGH (ref 70–99)
Glucose-Capillary: 116 mg/dL — ABNORMAL HIGH (ref 70–99)
Glucose-Capillary: 120 mg/dL — ABNORMAL HIGH (ref 70–99)
Glucose-Capillary: 121 mg/dL — ABNORMAL HIGH (ref 70–99)
Glucose-Capillary: 94 mg/dL (ref 70–99)

## 2024-03-30 LAB — CULTURE, BLOOD (ROUTINE X 2)
Culture  Setup Time: NO GROWTH
Culture: NO GROWTH
Special Requests: ADEQUATE

## 2024-03-30 LAB — BASIC METABOLIC PANEL WITH GFR
Anion gap: 9 (ref 5–15)
BUN: 13 mg/dL (ref 6–20)
CO2: 26 mmol/L (ref 22–32)
Calcium: 8.7 mg/dL — ABNORMAL LOW (ref 8.9–10.3)
Chloride: 107 mmol/L (ref 98–111)
Creatinine, Ser: 0.73 mg/dL (ref 0.61–1.24)
GFR, Estimated: >60 mL/min (ref >60)
Glucose, Bld: 111 mg/dL — ABNORMAL HIGH (ref 70–99)
Potassium: 3.6 mmol/L (ref 3.5–5.1)
Sodium: 142 mmol/L (ref 135–145)

## 2024-03-30 LAB — MAGNESIUM: Magnesium: 1.7 mg/dL (ref 1.7–2.4)

## 2024-03-30 LAB — PHOSPHORUS: Phosphorus: 5.5 mg/dL — ABNORMAL HIGH (ref 2.5–4.6)

## 2024-03-30 MED ORDER — HALOPERIDOL LACTATE 5 MG/ML IJ SOLN
2.0000 mg | Freq: Once | INTRAMUSCULAR | Status: AC
  Administered 2024-03-30: 2 mg via INTRAVENOUS

## 2024-03-30 MED ORDER — MAGNESIUM SULFATE 2 GM/50ML IV SOLN
2.0000 g | Freq: Once | INTRAVENOUS | Status: AC
  Administered 2024-03-30: 2 g via INTRAVENOUS
  Filled 2024-03-30: qty 50

## 2024-03-30 MED ORDER — DEXMEDETOMIDINE HCL IN NACL 400 MCG/100ML IV SOLN
0.0000 ug/kg/h | INTRAVENOUS | Status: DC
  Administered 2024-03-30: 0.8 ug/kg/h via INTRAVENOUS
  Administered 2024-03-30: 1.2 ug/kg/h via INTRAVENOUS
  Administered 2024-03-30: 0.4 ug/kg/h via INTRAVENOUS
  Administered 2024-03-30 – 2024-04-01 (×8): 1.2 ug/kg/h via INTRAVENOUS
  Filled 2024-03-30 (×8): qty 100
  Filled 2024-03-30: qty 200
  Filled 2024-03-30: qty 100
  Filled 2024-03-30: qty 200

## 2024-03-30 MED ORDER — BETHANECHOL CHLORIDE 25 MG PO TABS
25.0000 mg | ORAL_TABLET | Freq: Three times a day (TID) | ORAL | Status: DC
  Administered 2024-03-31 – 2024-04-01 (×2): 25 mg
  Filled 2024-03-30 (×2): qty 1

## 2024-03-30 MED ORDER — SENNA 8.6 MG PO TABS
2.0000 | ORAL_TABLET | Freq: Every day | ORAL | Status: DC
  Administered 2024-03-31: 17.2 mg
  Filled 2024-03-30: qty 2

## 2024-03-30 MED ORDER — THIAMINE MONONITRATE 100 MG PO TABS
100.0000 mg | ORAL_TABLET | Freq: Every day | ORAL | Status: DC
  Administered 2024-04-01 – 2024-04-03 (×3): 100 mg
  Filled 2024-03-30 (×3): qty 1

## 2024-03-30 MED ORDER — QUETIAPINE FUMARATE 100 MG PO TABS
100.0000 mg | ORAL_TABLET | Freq: Two times a day (BID) | ORAL | Status: DC
  Administered 2024-03-31 – 2024-04-03 (×6): 100 mg
  Filled 2024-03-30 (×8): qty 1

## 2024-03-30 MED ORDER — VITAMIN B-12 1000 MCG PO TABS
1000.0000 ug | ORAL_TABLET | Freq: Every day | ORAL | Status: DC
  Administered 2024-04-01 – 2024-04-03 (×3): 1000 ug
  Filled 2024-03-30 (×3): qty 1

## 2024-03-30 MED ORDER — QUETIAPINE FUMARATE 100 MG PO TABS: 100.0000 mg | ORAL_TABLET | Freq: Two times a day (BID) | ORAL | Status: DC

## 2024-03-30 MED ORDER — ALPRAZOLAM 0.25 MG PO TABS: 1.0000 mg | ORAL_TABLET | Freq: Three times a day (TID) | ORAL | Status: DC

## 2024-03-30 MED ORDER — ESCITALOPRAM OXALATE 10 MG PO TABS
10.0000 mg | ORAL_TABLET | Freq: Every day | ORAL | Status: DC
  Administered 2024-03-31 – 2024-04-02 (×3): 10 mg
  Filled 2024-03-30 (×3): qty 1

## 2024-03-30 MED ORDER — OXYCODONE HCL 5 MG PO TABS
5.0000 mg | ORAL_TABLET | ORAL | Status: DC | PRN
  Administered 2024-04-01: 5 mg
  Administered 2024-04-01 – 2024-04-03 (×3): 10 mg
  Filled 2024-03-30: qty 1
  Filled 2024-03-30 (×2): qty 2

## 2024-03-30 MED ORDER — DOCUSATE SODIUM 50 MG/5ML PO LIQD: 100.0000 mg | Freq: Two times a day (BID) | ORAL | Status: DC

## 2024-03-30 MED ORDER — FAMOTIDINE 20 MG PO TABS: 20.0000 mg | ORAL_TABLET | Freq: Two times a day (BID) | ORAL | Status: DC

## 2024-03-30 MED ORDER — LORAZEPAM 1 MG PO TABS: 1.0000 mg | ORAL_TABLET | Freq: Four times a day (QID) | ORAL | Status: DC | PRN

## 2024-03-30 MED ORDER — FOLIC ACID 1 MG PO TABS
1.0000 mg | ORAL_TABLET | Freq: Every day | ORAL | Status: DC
  Administered 2024-04-01 – 2024-04-03 (×3): 1 mg
  Filled 2024-03-30 (×3): qty 1

## 2024-03-30 MED ORDER — LORAZEPAM 2 MG/ML IJ SOLN
1.0000 mg | Freq: Four times a day (QID) | INTRAMUSCULAR | Status: DC | PRN
  Administered 2024-03-30 – 2024-03-31 (×3): 1 mg via INTRAVENOUS
  Filled 2024-03-30 (×3): qty 1

## 2024-03-30 MED ORDER — POTASSIUM CHLORIDE 10 MEQ/100ML IV SOLN
10.0000 meq | INTRAVENOUS | Status: AC
  Administered 2024-03-30 (×4): 10 meq via INTRAVENOUS
  Filled 2024-03-30 (×4): qty 100

## 2024-03-30 MED ORDER — THIAMINE MONONITRATE 100 MG PO TABS
100.0000 mg | ORAL_TABLET | Freq: Every day | ORAL | Status: DC
  Administered 2024-03-30: 100 mg
  Filled 2024-03-30: qty 1

## 2024-03-30 MED ORDER — HALOPERIDOL LACTATE 5 MG/ML IJ SOLN
INTRAMUSCULAR | Status: AC
Start: 2024-03-30 — End: 2024-03-30
  Filled 2024-03-30: qty 1

## 2024-03-30 MED ORDER — ORAL CARE MOUTH RINSE: 15.0000 mL | OROMUCOSAL | Status: DC | PRN

## 2024-03-30 MED ORDER — POLYETHYLENE GLYCOL 3350 17 G PO PACK
17.0000 g | PACK | Freq: Every day | ORAL | Status: DC
  Filled 2024-03-30: qty 1

## 2024-03-30 NOTE — Progress Notes (Signed)
 NAME:  Ricky Werner, MRN:  [id number], DOB:  1966/07/13, LOS: 5 ADMISSION DATE:  03/25/2024, CONSULTATION DATE:  4/30 REFERRING MD:  Dr. Kermit Ped , CHIEF COMPLAINT:  unresponsive; possible seizure; acute respiratory failure   History of Present Illness:  Patient is a 58 year old male with pertinent PMH alcohol  use with history of alcohol  withdrawal and withdrawal seizure, tobacco/marijuana abuse, HTN, anxiety/depression presents to [facility] ED on 4/30 unresponsive.  Patient has history of alcohol  withdrawal seizures last admitted on 2018.  On Keppra .  Patient lives with son.  Family states he has not been drinking regularly since he has been out of work.  On 4/30 patient LKN an hour and a half ago and son found patient on floor unresponsive.  Patient started CPR.  EMS arrived and patient unresponsive but had a pulse.  Patient given Narcan with no effect.  Patient noted to be bradycardic and placed on supplemental O2.  Transported to [facility] ED.  On arrival BP soft.  GCS 7.  Localizes to pain on right side.  On NRB with sats mid 80s requiring intubation.  Patient required levo for hypotension.  CT head no acute findings.  Neuro consulted recommending MRI.  Gave Keppra  load.  Started on thiamine . Ethanol wnl.  UDS and Keppra  level pending.  CT chest showing peribronchovascular infiltrates versus atelectasis in L>R favoring aspiration. Wbc 15. Cultures obtained and started on rocephin /azithro. Glucose 459, co2 15, AG 21. UA, Beta H, Osm pending. Given insulin .   Pertinent ED Labs: Creat 1.96, AST 64, ALT 83,  Pertinent  Medical History   Past Medical History:  Diagnosis Date   Alcohol  abuse    Anxiety    BPH (benign prostatic hyperplasia)    Carpal tunnel syndrome on right    Chronic back pain    Depression    Headache    History of dental problems    Hypertension    Osteoarthritis    Panic attacks    Right tennis elbow    Sciatica    Seizures (HCC) 09/30/2020   Vertigo      Significant  Hospital Events: Including procedures, antibiotic start and stop dates in addition to other pertinent events   4/30 admitted w/ acute encephalopathy intubated 5/1 weaning sedation. Decr FiO2 to 60%. MRI/A 5/2 near arrest after being repositioned. Brady w pauses, hypoxemic, biting tube.  5/3 continue to have thick secretions via ET tube, gets bradycardic with cough and suction 5/5 trial precedex  to help with agitation during SBT/SAT   Interim History / Subjective:  No seizures on EEG  Still agitated with reduction of propofol   Objective   Blood pressure 117/79, pulse 68, temperature 98.2 F (36.8 C), temperature source Oral, resp. rate 16, height 5\' 6"  (1.676 m), weight 92 kg, SpO2 94%.    Vent Mode: PSV FiO2 (%):  [40 %-50 %] 40 % Set Rate:  [24 bmp] 24 bmp Vt Set:  [510 mL] 510 mL PEEP:  [5 cmH20-8 cmH20] 5 cmH20 Pressure Support:  [5 cmH20] 5 cmH20 Plateau Pressure:  [18 cmH20-21 cmH20] 21 cmH20   Intake/Output Summary (Last 24 hours) at 03/30/2024 0823 Last data filed at 03/30/2024 0600 Gross per 24 hour  Intake 1881.37 ml  Output 1600 ml  Net 281.37 ml   Filed Weights   03/27/24 0500 03/28/24 0414 03/29/24 0500  Weight: 88 kg 89.1 kg 92 kg    Examination: General: Crtitically ill-appearing M, intubated and sedated  HEENT: /AT, eyes anicteric.  ETT and OGT  in place.  EEG leads in place Neuro: Opens eyes and follows commands with sedation wean, though still agitated with weaning propofol  Chest: continued thick secretions  Heart: Regular rate and rhythm, no murmurs or gallops, tachycardic with sedation wean Abdomen: Soft, nondistended, bowel sounds present Skin: No rash  Labs: K 3.6 Mag 1.7 Phos 535 Creatinine 0.73  Micro 4/30 BC x1> Actinomyces  5/1 Respiratory cx> normal flora    Resolved Hospital Problem list   Shock  AKI  Lactic acidosis Hypophosphatemia Assessment & Plan:  Acute toxic encephalopathy in the setting of amphetamine and cocaine  abuse Possible focal seizures with Todd's paralysis 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, opiate induced Polysubstance abuse Alcohol  abuse with alcohol  withdrawal seizures Sinus bradycardia with frequent pauses especially with suction or changing position Acute respiratory failure with hypoxia and hypercapnia Aspiration pneumonia Demand cardiac ischemia Chronic HFpEF Opiate dependence Anxiety disorder Exaggerated vagal tone Obesity Acute urinary retention  -Trial Precedex  to wean off propofol, hopefully able to extubate today -Watch for signs of withdrawal -Continue Keppra  1 g twice daily -question of opiate use at home, though urine tox is negative -MRI brain is consistent with findings of CHANTER which is opiate induced brain changes -Continue thiamine , folate and B12 -Neurology is following, appreciate recs EEG is negative for active seizures -complete Unayn 7 days -Maintain full vent support with SAT/SBT as tolerated -titrate Vent setting to maintain SpO2 greater than or equal to 90%. -HOB elevated 30 degrees. -Plateau pressures less than 30 cm H20.  -Follow chest x-ray, ABG prn.   -Bronchial hygiene and RT/bronchodilator protocol. -Echocardiogram showed no wall motion abnormalities with grade 1 diastolic dysfunction Continue Xanax  -Patient is having frequent pauses due to exaggerated vagal tone, per neurology has hyperreflexia, CT cervical spine showing foraminal narrowing, MRI done without any cord impingement  -Closely monitor bladder scan and Continue doxazosin and Urecholine    Best Practice (right click and "Reselect all SmartList Selections" daily)   Diet/type: tubefeeds DVT prophylaxis prophylactic heparin   Pressure ulcer(s): N/A GI prophylaxis: H2B Lines: N/A Foley:  Yes, and it is still needed Code Status:  full code Last date of multidisciplinary goals of care discussion [4/30 updated son Veryl Gottron over phone], update 5/5 pending  Labs   CBC: Recent Labs  Lab  03/25/24 1655 03/25/24 1657 03/26/24 0507 03/27/24 0726 03/28/24 1055 03/29/24 1122 03/30/24 0647  WBC 15.0*  --  12.5* 10.3  --   --  8.7  NEUTROABS 10.4*  --   --   --   --   --  4.4  HGB 12.8*   < > 10.7* 10.6* 9.2* 9.5* 10.3*  HCT 40.4   < > 32.2* 32.1* 27.0* 28.0* 31.8*  MCV 98.1  --  95.5 95.3  --   --  97.2  PLT 275  --  209 175  --   --  221   < > = values in this interval not displayed.    Basic Metabolic Panel: Recent Labs  Lab 03/25/24 1655 03/25/24 1657 03/25/24 1929 03/26/24 0507 03/26/24 1441 03/27/24 0726 03/28/24 0739 03/28/24 1055 03/29/24 1122  NA 135 136  --  142  --  136  --  140 140  K 4.1 4.2  --  3.9  --  4.3  --  3.6 3.9  CL 99 101  --  107  --  107  --   --   --   CO2 15*  --   --  26  --  25  --   --   --  GLUCOSE 459* 453*  --  84  --  133*  --   --   --   BUN 23* 25*  --  21*  --  19  --   --   --   CREATININE 1.96* 1.90*  --  1.25*  --  0.90  --   --   --   CALCIUM  8.6*  --   --  7.6*  --  7.9*  --   --   --   MG  --   --  1.9 1.2*  --  2.2 2.0  --   --   PHOS  --   --   --   --  2.3* 1.9* 2.5  --   --    GFR: Estimated Creatinine Clearance: 92.7 mL/min (by C-G formula based on SCr of 0.9 mg/dL). Recent Labs  Lab 03/25/24 1655 03/25/24 1929 03/26/24 0507 03/26/24 0844 03/27/24 0726 03/27/24 1238 03/27/24 2059 03/30/24 0647  WBC 15.0*  --  12.5*  --  10.3  --   --  8.7  LATICACIDVEN  --    < > 2.5* 4.8*  --  1.3 0.9  --    < > = values in this interval not displayed.    Liver Function Tests: Recent Labs  Lab 03/25/24 1655 03/26/24 0507 03/27/24 0726  AST 64* 48* 33  ALT 83* 59* 48*  ALKPHOS 39 24* 23*  BILITOT 0.7 1.0 1.1  PROT 6.9 4.7* 5.0*  ALBUMIN 3.9 2.5* 2.5*   No results for input(s): "LIPASE", "AMYLASE" in the last 168 hours. Recent Labs  Lab 03/25/24 2327  AMMONIA 34    ABG    Component Value Date/Time   PHART 7.383 03/29/2024 1122   PCO2ART 46.3 03/29/2024 1122   PO2ART 64 (L) 03/29/2024 1122    HCO3 27.6 03/29/2024 1122   TCO2 29 03/29/2024 1122   ACIDBASEDEF 3.2 (H) 03/25/2024 2330   O2SAT 91 03/29/2024 1122     Coagulation Profile: Recent Labs  Lab 03/25/24 1655  INR 1.2    Cardiac Enzymes: Recent Labs  Lab 03/25/24 1929 03/27/24 0726  CKTOTAL 401* 543*    HbA1C: Hgb A1c MFr Bld  Date/Time Value Ref Range Status  03/25/2024 07:29 PM 5.4 4.8 - 5.6 % Final    Comment:    (NOTE) Pre diabetes:          5.7%-6.4%  Diabetes:              >6.4%  Glycemic control for   <7.0% adults with diabetes     CBG: Recent Labs  Lab 03/29/24 1508 03/29/24 1919 03/29/24 2322 03/30/24 0324 03/30/24 0730  GLUCAP 93 125* 98 120* 121*    CRITICAL CARE Performed by: Patt Boozer Teofil Maniaci   Total critical care time: 32 minutes  Critical care time was exclusive of separately billable procedures and treating other patients.  Critical care was necessary to treat or prevent imminent or life-threatening deterioration.  Critical care was time spent personally by me on the following activities: development of treatment plan with patient and/or surrogate as well as nursing, discussions with consultants, evaluation of patient's response to treatment, examination of patient, obtaining history from patient or surrogate, ordering and performing treatments and interventions, ordering and review of laboratory studies, ordering and review of radiographic studies, pulse oximetry and re-evaluation of patient's condition.   Patt Boozer Norris Bodley, PA-C [facility] See Amion for pager If no response to pager , please  call [phone] until 7pm After 7:00 pm call Elink  [phone]

## 2024-03-30 NOTE — Progress Notes (Signed)
 NEUROLOGY CONSULT FOLLOW UP NOTE   Date of service: Mar 30, 2024 Patient Name: Ricky Werner MRN:  [id number] DOB:  1966-07-02  Interval Hx/subjective   Patient remains intubated and sedated, on propofol at 30.  He turns head to voice and follows, no movement in BUE with hyperreflexia, positive withdrawal to noxious stimuli in BLE.  LTM read today shows no seizures.   No family at bedside.   Vitals   Vitals:   03/30/24 0400 03/30/24 0500 03/30/24 0800 03/30/24 0806  BP: 116/74 117/79    Pulse: (!) 53 (!) 51  68  Resp: (!) 24 (!) 24  16  Temp: 97.9 F (36.6 C)  98.2 F (36.8 C)   TempSrc: Axillary  Oral   SpO2: 96% 97%  94%  Weight:      Height:         Body mass index is 32.74 kg/m.  Physical Exam   Constitutional: Well-developed and well-nourished  HENT: ETT in place Head: Normocephalic. C-collar is on.   Respiratory: Intubated Skin: WDI.    Neurologic Examination    Ment: Patient is intubated. Sedated on 30 of Propofol. He does turn his head to voice.  CN: Opens eyes to voice. With forced eye opening, no nystagmus and intermittent tracking. Cough and gag reflexes intact. Dolls eyes present.  Motor/Sensory:  BUE: No movement to BUE.  BLE: Spontaneous movement against gravity and withdraws to pain  Reflexes:  Hyperreflexia BUE.  Cerebellar: Unable to assess, patient only following simple commands at this time Gait: Unable to assess  Medications  Current Facility-Administered Medications:    ALPRAZolam  (XANAX ) tablet 1 mg, 1 mg, Per Tube, TID, Bowser, Grace E, NP, 1 mg at 03/29/24 2210   Ampicillin -Sulbactam (UNASYN ) 3 g in sodium chloride  0.9 % 100 mL IVPB, 3 g, Intravenous, Q6H, Ananias Balls, RPH, Last Rate: 200 mL/hr at 03/30/24 0558, 3 g at 03/30/24 0558   bethanechol  (URECHOLINE ) tablet 25 mg, 25 mg, Per Tube, TID, Trevor Fudge, MD, 25 mg at 03/29/24 2210   Chlorhexidine  Gluconate Cloth 2 % PADS 6 each, 6 each, Topical, Q2200, [name], John D, PA-C, 6  each at 03/29/24 2327   cyanocobalamin  (VITAMIN B12) tablet 1,000 mcg, 1,000 mcg, Per Tube, Daily, Oralee Billow I, RPH, 1,000 mcg at 03/29/24 4098   dexmedetomidine  (PRECEDEX ) 400 MCG/100ML (4 mcg/mL) infusion, 0-1.2 mcg/kg/hr, Intravenous, Continuous, Gleason, Patt Boozer, PA-C   docusate (COLACE) 50 MG/5ML liquid 100 mg, 100 mg, Per Tube, BID, [name], John D, PA-C, 100 mg at 03/29/24 2210   doxazosin (CARDURA) tablet 2 mg, 2 mg, Per Tube, Daily, Paliwal, [name], MD, 2 mg at 03/29/24 1191   escitalopram  (LEXAPRO ) tablet 10 mg, 10 mg, Per Tube, QHS, Bowser, Darin Edinger, NP, 10 mg at 03/29/24 2210   famotidine  (PEPCID ) tablet 20 mg, 20 mg, Per Tube, BID, [name], John D, PA-C, 20 mg at 03/29/24 2210   feeding supplement (OSMOLITE 1.5 CAL) liquid 1,000 mL, 1,000 mL, Per Tube, Continuous, Desai, Nikita S, MD, Last Rate: 50 mL/hr at 03/30/24 0500, Infusion Verify at 03/30/24 0500   feeding supplement (PROSource TF20) liquid 60 mL, 60 mL, Per Tube, BID, Desai, Nikita S, MD, 60 mL at 03/29/24 2210   fentaNYL  (SUBLIMAZE ) injection 50 mcg, 50 mcg, Intravenous, Q15 min PRN, Bowser, Darin Edinger, NP, 50 mcg at 03/28/24 1125   fentaNYL  (SUBLIMAZE ) injection 50-200 mcg, 50-200 mcg, Intravenous, Q30 min PRN, Delories Fetter, NP, 100 mcg at 03/29/24 2306   folic acid  (FOLVITE )  tablet 1 mg, 1 mg, Per Tube, Daily, [name], John D, PA-C, 1 mg at 03/29/24 4782   heparin  injection 5,000 Units, 5,000 Units, Subcutaneous, Q8H, [name], John D, PA-C, 5,000 Units at 03/30/24 0559   insulin  aspart (novoLOG ) injection 0-9 Units, 0-9 Units, Subcutaneous, Q4H, [name], John D, PA-C, 1 Units at 03/30/24 0818   ipratropium-albuterol  (DUONEB) 0.5-2.5 (3) MG/3ML nebulizer solution 3 mL, 3 mL, Nebulization, Q4H PRN, Bowser, Darin Edinger, NP, 3 mL at 03/30/24 0802   [COMPLETED] levETIRAcetam  (KEPPRA ) IVPB 1000 mg/100 mL premix, 2,000 mg, Intravenous, Once, Stopped at 03/25/24 1941 **FOLLOWED BY** levETIRAcetam  (KEPPRA ) IVPB 1000 mg/100 mL premix, 1,000  mg, Intravenous, Q12H, [name], RPH, Last Rate: 400 mL/hr at 03/30/24 9562, 1,000 mg at 03/30/24 1308   multivitamin with minerals tablet 1 tablet, 1 tablet, Per Tube, Daily, Casimiro Cleaves, PA-C, 1 tablet at 03/29/24 6578   norepinephrine  (LEVOPHED ) 4mg  in (0.016 mg/mL) premix infusion, 2-10 mcg/min, Intravenous, Titrated, Paliwal, [name], MD, Stopped at 03/28/24 1900   Oral care mouth rinse, 15 mL, Mouth Rinse, Q2H, [name], John D, PA-C, 15 mL at 03/30/24 4696   Oral care mouth rinse, 15 mL, Mouth Rinse, PRN, Casimiro Cleaves, PA-C   oxyCODONE  (Oxy IR/ROXICODONE ) immediate release tablet 5-10 mg, 5-10 mg, Per Tube, Q4H PRN, Trevor Fudge, MD   polyethylene glycol (MIRALAX  / GLYCOLAX ) packet 17 g, 17 g, Per Tube, Daily, Casimiro Cleaves, PA-C, 17 g at 03/29/24 2952   polyethylene glycol (MIRALAX  / GLYCOLAX ) packet 17 g, 17 g, Per Tube, Daily PRN, Oralee Billow I, RPH   propofol (DIPRIVAN) 1000 MG/100ML infusion, 5-80 mcg/kg/min, Intravenous, Titrated, Trevor Fudge, MD, Last Rate: 21.4 mL/hr at 03/30/24 0556, 40 mcg/kg/min at 03/30/24 0556   senna (SENOKOT) tablet 17.2 mg, 2 tablet, Per Tube, QHS, Cannon Champion, MD, 17.2 mg at 03/29/24 2210   sodium chloride  HYPERTONIC 3 % nebulizer solution 4 mL, 4 mL, Nebulization, Daily, Trevor Fudge, MD, 4 mL at 03/30/24 0802   thiamine  (VITAMIN B1) injection 100 mg, 100 mg, Intravenous, Daily, Casimiro Cleaves, PA-C, 100 mg at 03/29/24 8413  Labs and Diagnostic Imaging   CBC:  Recent Labs  Lab 03/25/24 1655 03/25/24 1657 03/27/24 0726 03/28/24 1055 03/29/24 1122 03/30/24 0647  WBC 15.0*   < > 10.3  --   --  8.7  NEUTROABS 10.4*  --   --   --   --  4.4  HGB 12.8*   < > 10.6*   < > 9.5* 10.3*  HCT 40.4   < > 32.1*   < > 28.0* 31.8*  MCV 98.1   < > 95.3  --   --  97.2  PLT 275   < > 175  --   --  221   < > = values in this interval not displayed.    Basic Metabolic Panel:  Lab Results  Component Value Date   NA 142 03/30/2024   K  3.6 03/30/2024   CO2 26 03/30/2024   GLUCOSE 111 (H) 03/30/2024   BUN 13 03/30/2024   CREATININE 0.73 03/30/2024   CALCIUM  8.7 (L) 03/30/2024   GFRNONAA >60 03/30/2024   GFRAA >60 08/16/2020   Lipid Panel: No results found for: "LDLCALC" HgbA1c:  Lab Results  Component Value Date   HGBA1C 5.4 03/25/2024   Urine Drug Screen:     Component Value Date/Time   LABOPIA NONE DETECTED 03/25/2024 2143   COCAINSCRNUR POSITIVE (A) 03/25/2024 2143   LABBENZ  POSITIVE (A) 03/25/2024 2143   AMPHETMU POSITIVE (A) 03/25/2024 2143   THCU NONE DETECTED 03/25/2024 2143   LABBARB NONE DETECTED 03/25/2024 2143    Alcohol  Level     Component Value Date/Time   [facility] <15 03/25/2024 1655   INR  Lab Results  Component Value Date   INR 1.2 03/25/2024   APTT  Lab Results  Component Value Date   APTT 24 03/25/2024     Assessment  58 year old man with past medical history significant for alcohol  abuse and benzodiazepine dependence, cigarette smoking, hypertension, alcohol  withdrawal seizures as well as concern for unprovoked seizures, on Keppra  outpatient. Son found him on Wednesday (4/30) at 4:30 PM with his head slumped backwards while sitting on the couch. No evidence of trauma, no tongue bite or urinary incontinence was noted.  Initially son thought that the patient was just taking a nap but when he tried to wake him up to tell him he was leaving the house, he realized that the patient was nonresponsive and "like a rag doll" with depressed respiratory rate. He was brought to the ED where he was evaluated as a Code Stroke via Teleneurology. Initially patient observed to have roving eye movements per ED provider with reactive pupils; was plegic on the left but spontaneously moving the right side.  He was actively being bagged to due to hypoxia. Over the course of the Teleneurology evaluation, the patient was noted to have increasing movement on the initially plegic left side; was able to squeeze and let  go bilaterally (weaker on the left than the right) but due to persistent hypoxia was intubated. Per ED report, after intubation he was agitated, moving all 4 extremities grossly equally. Per Teleneurology note, it was felt that his presentation was most likely due to a focal seizure with rapidly resolving post-ictal Todd's paralysis. He was loaded with Keppra  2000 mg at OSH prior to transfer to [facility].   - Exam today while on propofol sedation at a rate of 30 is similar to prior exams while on IV sedation. No clinical seizure activity seen.  - Imaging: - MRI shows concern for opoid-overdose related changes (CHANTER). Not on opioids at home and UDS was negative for opiates (positive for cocaine, amphet and benzo) but our tox screen is not sensitive for fentanyl .  - MRA negative. - UDS was positive for benzodiazepine, cocaine and amphetamine (on admission). He does have a Xanax  prescription, but no rx for Adderall. Suspect possible substance-related seizure at home versus toxidrome or a combination of both.   - LTM EEG report for today AM: This study is suggestive of moderate to severe diffuse encephalopathy, likely related to sedation. No seizures or epileptiform discharges were seen throughout the recording.(Consistent from yesterday)  - Impressions:  - Acute presentation most likely secondary to focal seizure with post-ictal Todd's paralysis. Seizures possibly due to alcohol  withdrawal as patient has had these in the past, and family informed teleneurology that he has not been drinking recently.  - Toxic metabolic encephalopathy in the setting of hyperglycemia and cocaine abuse as well as possible breakthrough seizure due to the same. - Potential for alcohol  withdrawal and benzo withdrawal as contributing factors. Lowered seizure threshold secondary to hyperglycemia. Possible missed medications secondary to memory issues. 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 Per the literature, severe bradycardia as a complication of acute cervical spinal cord injury is common as a result of post-injury imbalance  in the autonomic nervous system caused by dissociation of spinal cardiac and vasomotor sympathetic fibers, while the parasympathetic fibers that travel with vagus nerve remain intact. - Other comorbidities: Concern for renal dysfunction (AKI vs. CKD, no recent labs) . Hypoxia s/p intubation secondary to pneumonia. Concern for underlying memory dysfunction potentially in the setting of polypharmacy / ethanol abuse   Recommendations   - Continue LTM EEG while weaning sedation - Continue to wean sedation and extubate when able - Continue pneumonia treatment and infectious workup, as infection can further lower seizure threshold. Avoid cefepime.  - Continue thiamine  and folic acid  supplementation - Continue Keppra  1000 mg BID - Continue Xanax  1mg  TID  - Continue Inpatient seizure precautions - Outpatient seizure precautions should be discussed with the patient when he is awake.  - Substance abuse education when appropriate  ______________________________________________________________________   Pt seen by Neuro NP/APP and later by MD. Note/plan to be edited by MD as needed.    Audrene Lease, DNP [facility] Please use AMION for contact information & EPIC for messaging.   NEUROHOSPITALIST ADDENDUM Performed a face to face diagnostic evaluation.   I have reviewed the contents of history and physical exam as documented by PA/ARNP/Resident and agree with above documentation.  I have discussed and formulated the above plan as documented. Edits to the note have been made as needed.  Impression/Key exam findings/Plan: Critical care team planning SBT trial and extubate as able. Leave on LTM while sedation is weaned off in anticipation of extubation. He is following commands in BL lower  extremities. He is hyperreflexic in BL uppers with reduced movements in BL uppers. MR C spine does not show a cord compression or demyelinating lesion in the C spine.  Kathrynne Kulinski, MD [facility] [phone]   If 7pm to 7am, please call on call as listed on AMION.

## 2024-03-30 NOTE — Progress Notes (Addendum)
 1140- Passed SBT. Extubated to HFNC 50L/50% per order.  Pt tolerating well. No stridor noted.

## 2024-03-30 NOTE — Procedures (Addendum)
 Patient Name: Ricky Werner  MRN: [id number]  Epilepsy Attending: Arleene Lack  Referring Physician/Provider: Tona Francis, MD  Duration: 03/29/2024 1015 to 03/30/2024 1130   Patient history: 58yo M with left-sided weakness. EEG to evaluate for seizure   Level of alertness: comatose/lethargic   AEDs during EEG study: Xanax , LEV, propofol   Technical aspects: This EEG study was done with scalp electrodes positioned according to the 10-20 International system of electrode placement. Electrical activity was reviewed with band pass filter of 1-70Hz , sensitivity of 7 uV/mm, display speed of 18mm/sec with a 60Hz  notched filter applied as appropriate. EEG data were recorded continuously and digitally stored.  Video monitoring was available and reviewed as appropriate.   Description: EEG showed continuous generalized 5 to 9 Hz theta- alpha activty admixed with intermittent 2-3hz  delta slowing. Hyperventilation and photic stimulation were not performed.    EEG was disconnected between 03/29/2024 1356 to 1506 for imaging    ABNORMALITY - Continuous slow, generalized   IMPRESSION: This study is suggestive of moderate diffuse encephalopathy, likely related to sedation. No seizures or epileptiform discharges were seen throughout the recording.   Khalil Belote O Sydnie Sigmund

## 2024-03-30 NOTE — Progress Notes (Signed)
 eLink Physician-Brief Progress Note Patient Name: Ricky Werner DOB: 09-12-1966 MRN: [id number]   Date of Service  03/30/2024  HPI/Events of Note  58 year old male with polysubstance abuse who presented with seizures, complicated with aspiration pneumonia requiring endotracheal intubation   Extubated earlier today, last enteral access at that time, requiring heated high flow and intolerant of removal-difficult to place NG tube given oxygen requirements.  Antiepileptic meds are all IV, remaining meds are geared towards his mood  eICU Interventions  In place of the scheduled internal benzo, add IV lorazepam  as needed     Intervention Category Minor Interventions: Agitation / anxiety - evaluation and management  Seraj Dunnam 03/30/2024, 8:39 PM

## 2024-03-30 NOTE — Progress Notes (Signed)
 LTM EEG disconnected. [facility] notified. Nurse notified of skin breakdown at [facility] - Cz, Fp1, F7.

## 2024-03-30 NOTE — Progress Notes (Addendum)
 LTM maint complete - no skin breakdown under:  C4,Fp2

## 2024-03-31 ENCOUNTER — Inpatient Hospital Stay (HOSPITAL_COMMUNITY)

## 2024-03-31 ENCOUNTER — Encounter (HOSPITAL_COMMUNITY): Payer: Self-pay

## 2024-03-31 DIAGNOSIS — F10939 Alcohol use, unspecified with withdrawal, unspecified: Secondary | ICD-10-CM

## 2024-03-31 DIAGNOSIS — F10139 Alcohol abuse with withdrawal, unspecified: Secondary | ICD-10-CM | POA: Diagnosis not present

## 2024-03-31 DIAGNOSIS — R569 Unspecified convulsions: Secondary | ICD-10-CM | POA: Diagnosis not present

## 2024-03-31 DIAGNOSIS — N179 Acute kidney failure, unspecified: Secondary | ICD-10-CM | POA: Diagnosis not present

## 2024-03-31 DIAGNOSIS — G928 Other toxic encephalopathy: Secondary | ICD-10-CM | POA: Diagnosis not present

## 2024-03-31 DIAGNOSIS — F1410 Cocaine abuse, uncomplicated: Secondary | ICD-10-CM | POA: Diagnosis not present

## 2024-03-31 DIAGNOSIS — J690 Pneumonitis due to inhalation of food and vomit: Secondary | ICD-10-CM

## 2024-03-31 DIAGNOSIS — G8384 Todd's paralysis (postepileptic): Secondary | ICD-10-CM | POA: Diagnosis not present

## 2024-03-31 DIAGNOSIS — J9601 Acute respiratory failure with hypoxia: Secondary | ICD-10-CM | POA: Diagnosis not present

## 2024-03-31 LAB — CBC
HCT: 32.6 % — ABNORMAL LOW (ref 39.0–52.0)
Hemoglobin: 11 g/dL — ABNORMAL LOW (ref 13.0–17.0)
MCH: 31.4 pg (ref 26.0–34.0)
MCHC: 33.7 g/dL (ref 30.0–36.0)
MCV: 93.1 fL (ref 80.0–100.0)
Platelets: 246 10*3/uL (ref 150–400)
RBC: 3.5 MIL/uL — ABNORMAL LOW (ref 4.22–5.81)
RDW: 13.7 % (ref 11.5–15.5)
WBC: 11.2 10*3/uL — ABNORMAL HIGH (ref 4.0–10.5)
nRBC: 0 % (ref 0.0–0.2)

## 2024-03-31 LAB — MAGNESIUM: Magnesium: 1.8 mg/dL (ref 1.7–2.4)

## 2024-03-31 LAB — BASIC METABOLIC PANEL WITH GFR
Anion gap: 12 (ref 5–15)
Anion gap: 13 (ref 5–15)
BUN: 12 mg/dL (ref 6–20)
BUN: 14 mg/dL (ref 6–20)
CO2: 23 mmol/L (ref 22–32)
CO2: 23 mmol/L (ref 22–32)
Calcium: 8.7 mg/dL — ABNORMAL LOW (ref 8.9–10.3)
Calcium: 8.9 mg/dL (ref 8.9–10.3)
Chloride: 104 mmol/L (ref 98–111)
Chloride: 101 mmol/L (ref 98–111)
Creatinine, Ser: 0.77 mg/dL (ref 0.61–1.24)
Creatinine, Ser: 0.93 mg/dL (ref 0.61–1.24)
GFR, Estimated: >60 mL/min (ref >60)
GFR, Estimated: >60 mL/min (ref >60)
Glucose, Bld: 97 mg/dL (ref 70–99)
Glucose, Bld: 101 mg/dL — ABNORMAL HIGH (ref 70–99)
Potassium: 4.2 mmol/L (ref 3.5–5.1)
Potassium: 3.8 mmol/L (ref 3.5–5.1)
Sodium: 139 mmol/L (ref 135–145)
Sodium: 137 mmol/L (ref 135–145)

## 2024-03-31 LAB — GLUCOSE, CAPILLARY
Glucose-Capillary: 101 mg/dL — ABNORMAL HIGH (ref 70–99)
Glucose-Capillary: 104 mg/dL — ABNORMAL HIGH (ref 70–99)
Glucose-Capillary: 107 mg/dL — ABNORMAL HIGH (ref 70–99)
Glucose-Capillary: 108 mg/dL — ABNORMAL HIGH (ref 70–99)
Glucose-Capillary: 98 mg/dL (ref 70–99)
Glucose-Capillary: 99 mg/dL (ref 70–99)

## 2024-03-31 LAB — PHOSPHORUS: Phosphorus: 3.6 mg/dL (ref 2.5–4.6)

## 2024-03-31 MED ORDER — PHENOBARBITAL 32.4 MG PO TABS: 97.2000 mg | ORAL_TABLET | Freq: Three times a day (TID) | ORAL | Status: DC

## 2024-03-31 MED ORDER — PHENOBARBITAL SODIUM 130 MG/ML IJ SOLN
97.5000 mg | Freq: Three times a day (TID) | INTRAMUSCULAR | Status: AC
  Administered 2024-03-31 – 2024-04-02 (×6): 97.5 mg via INTRAVENOUS
  Filled 2024-03-31 (×6): qty 1

## 2024-03-31 MED ORDER — LEVETIRACETAM (KEPPRA) 500 MG/5 ML ADULT IV PUSH
1000.0000 mg | Freq: Two times a day (BID) | INTRAVENOUS | Status: DC
  Administered 2024-03-31 – 2024-04-04 (×8): 1000 mg via INTRAVENOUS
  Filled 2024-03-31 (×8): qty 10

## 2024-03-31 MED ORDER — PHENOBARBITAL SODIUM 65 MG/ML IJ SOLN
32.5000 mg | Freq: Three times a day (TID) | INTRAMUSCULAR | Status: AC
  Administered 2024-04-04 – 2024-04-06 (×6): 32.5 mg via INTRAVENOUS
  Filled 2024-03-31 (×6): qty 1

## 2024-03-31 MED ORDER — FUROSEMIDE 10 MG/ML IJ SOLN
40.0000 mg | Freq: Once | INTRAMUSCULAR | Status: AC
  Filled 2024-03-31: qty 4

## 2024-03-31 MED ORDER — HALOPERIDOL LACTATE 5 MG/ML IJ SOLN
5.0000 mg | Freq: Four times a day (QID) | INTRAMUSCULAR | Status: DC | PRN
  Administered 2024-03-31 – 2024-04-03 (×4): 5 mg via INTRAVENOUS
  Filled 2024-03-31 (×4): qty 1

## 2024-03-31 MED ORDER — PHENOBARBITAL SODIUM 65 MG/ML IJ SOLN
65.0000 mg | Freq: Three times a day (TID) | INTRAMUSCULAR | Status: AC
Start: 2024-04-02 — End: 2024-04-04
  Administered 2024-04-02 – 2024-04-04 (×6): 65 mg via INTRAVENOUS
  Filled 2024-03-31 (×7): qty 1

## 2024-03-31 MED ORDER — PHENOBARBITAL 32.4 MG PO TABS: 32.4000 mg | ORAL_TABLET | Freq: Three times a day (TID) | ORAL | Status: DC

## 2024-03-31 MED ORDER — SODIUM CHLORIDE 3 % IN NEBU
4.0000 mL | INHALATION_SOLUTION | Freq: Every day | RESPIRATORY_TRACT | Status: AC
  Administered 2024-04-01: 4 mL via RESPIRATORY_TRACT
  Filled 2024-03-31 (×2): qty 4

## 2024-03-31 MED ORDER — ALPRAZOLAM 0.5 MG PO TABS
2.0000 mg | ORAL_TABLET | Freq: Three times a day (TID) | ORAL | Status: DC
  Administered 2024-03-31 – 2024-04-01 (×2): 2 mg
  Filled 2024-03-31 (×2): qty 4

## 2024-03-31 MED ORDER — FUROSEMIDE 10 MG/ML IJ SOLN
INTRAMUSCULAR | Status: AC
Start: 2024-03-31 — End: 2024-03-31
  Administered 2024-03-31: 40 mg via INTRAVENOUS
  Filled 2024-03-31: qty 4

## 2024-03-31 MED ORDER — MAGNESIUM SULFATE 2 GM/50ML IV SOLN
2.0000 g | Freq: Once | INTRAVENOUS | Status: AC
  Administered 2024-03-31: 2 g via INTRAVENOUS
  Filled 2024-03-31: qty 50

## 2024-03-31 MED ORDER — PHENOBARBITAL 32.4 MG PO TABS: 64.8000 mg | ORAL_TABLET | Freq: Three times a day (TID) | ORAL | Status: DC

## 2024-03-31 MED ORDER — CLONIDINE HCL 0.1 MG PO TABS
0.1000 mg | ORAL_TABLET | Freq: Three times a day (TID) | ORAL | Status: DC
  Administered 2024-03-31 – 2024-04-03 (×9): 0.1 mg
  Filled 2024-03-31 (×9): qty 1

## 2024-03-31 NOTE — Progress Notes (Signed)
 NEUROLOGY CONSULT FOLLOW UP NOTE   Date of service: Mar 31, 2024 Patient Name: Ricky Werner MRN:  [id number] DOB:  August 16, 1966  Interval Hx/subjective   Patient extubated 5/5, now on HHF 45%/40L. On 1.2 Precedex . RN at bedside, no family at bedside.   On exam, patient follows commands, is able to name objects, knows he's in the hospital., is moving all extremities.   Vitals   Vitals:   03/31/24 0500 03/31/24 0600 03/31/24 0800 03/31/24 0801  BP: 135/87 (!) 163/90  (!) 121/95  Pulse: 70 63  68  Resp: 18   (!) 24  Temp:   100.2 F (37.9 C)   TempSrc:   Axillary   SpO2: 94% 94%  95%  Weight: 86.8 kg     Height:         Body mass index is 30.89 kg/m.  Physical Exam   Constitutional: Well-developed and well-nourished  Head: Normocephalic. C-collar is on.   Respiratory: HHF 45%, 40L Skin: WDI.    Neurologic Examination    Ment: Opens eyes spontaneously, follows commands, naming intact  CN:  Control and instrumentation engineer, PERRL Blinks to threat bilaterally Facial movement intact and symmetric Hearing intact to voice Cough and gag intact Tongue protrusion midline Motor/Sensory:  Spontaneous movement against gravity of all extremities Reflexes:  No hypereflexia seen today, was witnessed in BUE on yesterday's exam Cerebellar: no overt ataxia seen.  Gait: Unable to assess  Medications  Current Facility-Administered Medications:    ALPRAZolam  (XANAX ) tablet 1 mg, 1 mg, Per Tube, TID, Gleason, Patt Boozer, PA-C   Ampicillin -Sulbactam (UNASYN ) 3 g in sodium chloride  0.9 % 100 mL IVPB, 3 g, Intravenous, Q6H, Ananias Balls, RPH, Paused at 03/31/24 1308   bethanechol  (URECHOLINE ) tablet 25 mg, 25 mg, Per Tube, TID, Gleason, Patt Boozer, PA-C   Chlorhexidine  Gluconate Cloth 2 % PADS 6 each, 6 each, Topical, Q2200, [name], John D, PA-C, 6 each at 03/30/24 1300   cyanocobalamin  (VITAMIN B12) tablet 1,000 mcg, 1,000 mcg, Per Tube, Daily, Gleason, Patt Boozer, PA-C   dexmedetomidine  (PRECEDEX ) 400  MCG/100ML (4 mcg/mL) infusion, 0-1.2 mcg/kg/hr, Intravenous, Continuous, Gleason, Patt Boozer, PA-C, Last Rate: 27.6 mL/hr at 03/31/24 0604, 1.2 mcg/kg/hr at 03/31/24 0604   docusate (COLACE) 50 MG/5ML liquid 100 mg, 100 mg, Per NG tube, BID, Gleason, Patt Boozer, PA-C   escitalopram  (LEXAPRO ) tablet 10 mg, 10 mg, Per Tube, QHS, Gleason, Patt Boozer, PA-C   famotidine  (PEPCID ) tablet 20 mg, 20 mg, Per Tube, BID, Gleason, Patt Boozer, PA-C   feeding supplement (OSMOLITE 1.5 CAL) liquid 1,000 mL, 1,000 mL, Per Tube, Continuous, Aleck Hurdle, MD, Stopped at 03/30/24 1001   feeding supplement (PROSource TF20) liquid 60 mL, 60 mL, Per Tube, BID, Desai, Nikita S, MD, 60 mL at 03/30/24 6578   folic acid  (FOLVITE ) tablet 1 mg, 1 mg, Per Tube, Daily, Gleason, Patt Boozer, PA-C   heparin  injection 5,000 Units, 5,000 Units, Subcutaneous, Q8H, [name], John D, PA-C, 5,000 Units at 03/31/24 4696   insulin  aspart (novoLOG ) injection 0-9 Units, 0-9 Units, Subcutaneous, Q4H, [name], John D, PA-C, 1 Units at 03/30/24 0818   ipratropium-albuterol  (DUONEB) 0.5-2.5 (3) MG/3ML nebulizer solution 3 mL, 3 mL, Nebulization, Q4H PRN, Bowser, Darin Edinger, NP, 3 mL at 03/30/24 0802   [COMPLETED] levETIRAcetam  (KEPPRA ) IVPB 1000 mg/100 mL premix, 2,000 mg, Intravenous, Once, Stopped at 03/25/24 1941 **FOLLOWED BY** levETIRAcetam  (KEPPRA ) IVPB 1000 mg/100 mL premix, 1,000 mg, Intravenous, Q12H, [name], RPH, Last Rate: 400 mL/hr at 03/31/24 0605,  1,000 mg at 03/31/24 1610   LORazepam  (ATIVAN ) injection 1 mg, 1 mg, Intravenous, Q6H PRN, Paliwal, [name], MD, 1 mg at 03/31/24 0514   multivitamin with minerals tablet 1 tablet, 1 tablet, Per Tube, Daily, Casimiro Cleaves, PA-C, 1 tablet at 03/30/24 9604   Oral care mouth rinse, 15 mL, Mouth Rinse, PRN, Trevor Fudge, MD   oxyCODONE  (Oxy IR/ROXICODONE ) immediate release tablet 5-10 mg, 5-10 mg, Per Tube, Q4H PRN, Gleason, Patt Boozer, PA-C   polyethylene glycol (MIRALAX  / GLYCOLAX ) packet 17 g, 17 g, Per  Tube, Daily PRN, Oralee Billow I, RPH   polyethylene glycol (MIRALAX  / GLYCOLAX ) packet 17 g, 17 g, Per Tube, Daily, Gleason, Patt Boozer, PA-C   QUEtiapine (SEROQUEL) tablet 100 mg, 100 mg, Per Tube, BID, Gleason, Patt Boozer, PA-C   senna (SENOKOT) tablet 17.2 mg, 2 tablet, Per Tube, QHS, Gleason, Patt Boozer, PA-C   thiamine  (VITAMIN B1) tablet 100 mg, 100 mg, Per Tube, Daily, Gleason, Curlee Doss  Labs and Diagnostic Imaging   CBC:  Recent Labs  Lab 03/25/24 1655 03/25/24 1657 03/30/24 0647 03/30/24 1046 03/31/24 0620  WBC 15.0*   < > 8.7  --  11.2*  NEUTROABS 10.4*  --  4.4  --   --   HGB 12.8*   < > 10.3* 8.8* 11.0*  HCT 40.4   < > 31.8* 26.0* 32.6*  MCV 98.1   < > 97.2  --  93.1  PLT 275   < > 221  --  246   < > = values in this interval not displayed.    Basic Metabolic Panel:  Lab Results  Component Value Date   NA 139 03/31/2024   K 4.2 03/31/2024   CO2 23 03/31/2024   GLUCOSE 97 03/31/2024   BUN 12 03/31/2024   CREATININE 0.77 03/31/2024   CALCIUM  8.7 (L) 03/31/2024   GFRNONAA >60 03/31/2024   GFRAA >60 08/16/2020   Lipid Panel: No results found for: "LDLCALC" HgbA1c:  Lab Results  Component Value Date   HGBA1C 5.4 03/25/2024   Urine Drug Screen:     Component Value Date/Time   LABOPIA NONE DETECTED 03/25/2024 2143   COCAINSCRNUR POSITIVE (A) 03/25/2024 2143   LABBENZ POSITIVE (A) 03/25/2024 2143   AMPHETMU POSITIVE (A) 03/25/2024 2143   THCU NONE DETECTED 03/25/2024 2143   LABBARB NONE DETECTED 03/25/2024 2143    Alcohol  Level     Component Value Date/Time   ETH <15 03/25/2024 1655   INR  Lab Results  Component Value Date   INR 1.2 03/25/2024   APTT  Lab Results  Component Value Date   APTT 24 03/25/2024     Assessment  58 year old man with past medical history significant for alcohol  abuse and benzodiazepine dependence, cigarette smoking, hypertension, alcohol  withdrawal seizures as well as concern for unprovoked seizures, on Keppra   outpatient.   Per ED report, after intubation he was agitated, moving all 4 extremities grossly equally. Per Teleneurology note, it was felt that his presentation was most likely due to a focal seizure with rapidly resolving post-ictal Todd's paralysis. He was loaded with Keppra  2000 mg at OSH prior to transfer to [facility], continued on Keppra  1,000 BID (home dose).  - Imaging: - MRI shows concern for opoid-overdose related changes (CHANTER). Not on opioids at home and UDS was negative for opiates (positive for cocaine, amphet and benzo) but our tox screen is not sensitive for fentanyl .  - MRA negative. - MRI C-spine: Normal cord signal  -  UDS was positive for benzodiazepine, cocaine and amphetamine (on admission). He does have a Xanax  prescription, but no rx for Adderall. Suspect possible substance-related seizure at home versus toxidrome or a combination of both.   - No seizures seen on multiple days of LTM.   - Impressions:  - Acute presentation most likely secondary to focal seizure with post-ictal Todd's paralysis. Seizures possibly due to alcohol  withdrawal as patient has had these in the past, and family informed teleneurology that he has not been drinking recently. Lowered seizure threshold secondary to hyperglycemia.  - Toxic metabolic encephalopathy in the setting of hyperglycemia and cocaine abuse as well as possible breakthrough seizure due to the same. - Possible missed medications secondary to baseline memory issues. - Other comorbidities: Concern for renal dysfunction (AKI vs. CKD, no recent labs) . Hypoxia secondary to pneumonia, likely aspiration.    Recommendations   - Continue to wean Precedex  as able - Continue pneumonia treatment and infectious workup, as infection can further lower seizure threshold. Avoid cefepime.  - Continue thiamine  and folic acid  supplementation - continue electrolyte derangement correction - Continue Keppra  1000 mg BID (home dose). IV while no PO  access. Transition to PO when able. - Continue Xanax  2mg  TID (home dose) when able to take PO/Per Tube.   Could consider starting on pehnobarb for benzo withdrawal taper while unable to take this medication.   - Continue Inpatient seizure precautions - Outpatient seizure precautions should be discussed with the patient when he is awake.  - Substance abuse education when appropriate  ______________________________________________________________________   Pt seen by Neuro NP/APP and later by MD. Note/plan to be edited by MD as needed.    Audrene Lease, DNP [facility] Please use AMION for contact information & EPIC for messaging.  NEUROHOSPITALIST ADDENDUM Performed a face to face diagnostic evaluation.   I have reviewed the contents of history and physical exam as documented by PA/ARNP/Resident and agree with above documentation.  I have discussed and formulated the above plan as documented. Edits to the note have been made as needed.  Dayannara Pascal, MD [facility] [phone]   If 7pm to 7am, please call on call as listed on AMION.

## 2024-03-31 NOTE — Evaluation (Signed)
 Clinical/Bedside Swallow Evaluation Patient Details  Name: Ricky Werner MRN: [id number] Date of Birth: 31-Jul-1966  Today's Date: 03/31/2024 Time: SLP Start Time (ACUTE ONLY): 1224 SLP Stop Time (ACUTE ONLY): 1239 SLP Time Calculation (min) (ACUTE ONLY): 15 min  Past Medical History:  Past Medical History:  Diagnosis Date   Alcohol  abuse    Anxiety    BPH (benign prostatic hyperplasia)    Carpal tunnel syndrome on right    Chronic back pain    Depression    History of dental problems    Hypertension    Osteoarthritis    Panic attacks    Right tennis elbow    Sciatica    Seizures (HCC) 09/30/2020   Vertigo    Past Surgical History:  Past Surgical History:  Procedure Laterality Date   KIDNEY STONE SURGERY     HPI:  Ricky Werner is a 58 yo male presenting 4/30 after being found unresponsive. W/u revealed L sided weakness, CTH negative. CT Chest pending. UDS + for cocaine, benzo, amphetamines. Admitted for managements of seizure with post-ictal Todd's paralysis and toxic metabolic encephalopathy in the setting of hyperglycemia and cocaine abuse. ETT 4/30-5/5. PMH includes alcohol  abuse and benzodiazepine dependence, tobacco use, HTN, alcohol  withdrawal seizures as well as concern for unprovoked seizures on Keppra     Assessment / Plan / Recommendation  Clinical Impression  Pt extubated previous date and was tachypneic throughout the evaluation on 30 LPM HHFNC. He is oriented x3 but generally confused and easily agitated during functional tasks. His vocal quality appears strong. He passed a Yale swallow screen and currently presents without signs clinically concerning for aspiration with straw sips of thin liquids. Awareness of boluses and oral transit appears impaired resulting in residue particularly with solid textures. Recommend he remain NPO given his mentation and respiratory status rather than an overt concern with swallow function. If there is a significant improvement  related to attention and awareness before SLP reassesses, feel a PO diet could be initiated per RN/MD discretion. Will continue following. SLP Visit Diagnosis: Dysphagia, unspecified (R13.10)    Aspiration Risk  Mild aspiration risk    Diet Recommendation NPO except meds;Ice chips PRN after oral care    Medication Administration: Whole meds with puree    Other  Recommendations Oral Care Recommendations: Oral care QID;Oral care prior to ice chip/H20    Recommendations for follow up therapy are one component of a multi-disciplinary discharge planning process, led by the attending physician.  Recommendations may be updated based on patient status, additional functional criteria and insurance authorization.  Follow up Recommendations Skilled nursing-short term rehab (<3 hours/day)      Assistance Recommended at Discharge    Functional Status Assessment Patient has had a recent decline in their functional status and demonstrates the ability to make significant improvements in function in a reasonable and predictable amount of time.  Frequency and Duration min 2x/week  2 weeks       Prognosis Prognosis for improved oropharyngeal function: Good Barriers to Reach Goals: Cognitive deficits      Swallow Study   General HPI: Ricky Werner is a 58 yo male presenting 4/30 after being found unresponsive. W/u revealed L sided weakness, CTH negative. CT Chest pending. UDS + for cocaine, benzo, amphetamines. Admitted for managements of seizure with post-ictal Todd's paralysis and toxic metabolic encephalopathy in the setting of hyperglycemia and cocaine abuse. ETT 4/30-5/5. PMH includes alcohol  abuse and benzodiazepine dependence, tobacco use, HTN, alcohol  withdrawal seizures as  well as concern for unprovoked seizures on Keppra  Type of Study: Bedside Swallow Evaluation Previous Swallow Assessment: none in chart Diet Prior to this Study: NPO Temperature Spikes Noted: No Respiratory Status: Nasal  cannula (30 LPM HHFNC) History of Recent Intubation: Yes Total duration of intubation (days): 6 days Date extubated: 03/30/24 Behavior/Cognition: Alert;Distractible;Requires cueing;Agitated;Confused Oral Cavity Assessment: Within Functional Limits Oral Care Completed by SLP: Yes Oral Cavity - Dentition: Missing dentition;Poor condition Vision: Functional for self-feeding Self-Feeding Abilities: Total assist (pt restrained) Patient Positioning: Upright in bed Baseline Vocal Quality: Normal Volitional Cough: Strong Volitional Swallow: Able to elicit    Oral/Motor/Sensory Function Overall Oral Motor/Sensory Function: Within functional limits   Ice Chips Ice chips: Not tested   Thin Liquid Thin Liquid: Within functional limits Presentation: Straw    Nectar Thick Nectar Thick Liquid: Not tested   Honey Thick Honey Thick Liquid: Not tested   Puree Puree: Within functional limits Presentation: Spoon   Solid     Solid: Impaired Oral Phase Impairments: Poor awareness of bolus Oral Phase Functional Implications: Prolonged oral transit;Oral residue      Amil Kale, M.A., CCC-SLP Speech Language Pathology, Acute Rehabilitation Services  Secure Chat preferred [phone]   03/31/2024,1:26 PM

## 2024-03-31 NOTE — Progress Notes (Signed)
 NAME:  Ricky Werner, MRN:  [id number], DOB:  Dec 04, 1965, LOS: 6 ADMISSION DATE:  03/25/2024, CONSULTATION DATE:  4/30 REFERRING MD:  Dr. Kermit Ped , CHIEF COMPLAINT:  unresponsive; possible seizure; acute respiratory failure   History of Present Illness:  Patient is a 58 year old male with pertinent PMH alcohol  use with history of alcohol  withdrawal and withdrawal seizure, tobacco/marijuana abuse, HTN, anxiety/depression presents to [facility] ED on 4/30 unresponsive.  Patient has history of alcohol  withdrawal seizures last admitted on 2018.  On Keppra .  Patient lives with son.  Family states he has not been drinking regularly since he has been out of work.  On 4/30 patient LKN an hour and a half ago and son found patient on floor unresponsive.  Patient started CPR.  EMS arrived and patient unresponsive but had a pulse.  Patient given Narcan with no effect.  Patient noted to be bradycardic and placed on supplemental O2.  Transported to [facility] ED.  On arrival BP soft.  GCS 7.  Localizes to pain on right side.  On NRB with sats mid 80s requiring intubation.  Patient required levo for hypotension.  CT head no acute findings.  Neuro consulted recommending MRI.  Gave Keppra  load.  Started on thiamine . Ethanol wnl.  UDS and Keppra  level pending.  CT chest showing peribronchovascular infiltrates versus atelectasis in L>R favoring aspiration. Wbc 15. Cultures obtained and started on rocephin /azithro. Glucose 459, co2 15, AG 21. UA, Beta H, Osm pending. Given insulin .   Pertinent ED Labs: Creat 1.96, AST 64, ALT 83,  Pertinent  Medical History   Past Medical History:  Diagnosis Date   Alcohol  abuse    Anxiety    BPH (benign prostatic hyperplasia)    Carpal tunnel syndrome on right    Chronic back pain    Depression    Headache    History of dental problems    Hypertension    Osteoarthritis    Panic attacks    Right tennis elbow    Sciatica    Seizures (HCC) 09/30/2020   Vertigo      Significant  Hospital Events: Including procedures, antibiotic start and stop dates in addition to other pertinent events   4/30 admitted w/ acute encephalopathy intubated 5/1 weaning sedation. Decr FiO2 to 60%. MRI/A 5/2 near arrest after being repositioned. Brady w pauses, hypoxemic, biting tube.  5/3 continue to have thick secretions via ET tube, gets bradycardic with cough and suction 5/5 trial precedex  to help with agitation during SBT/SAT, extubated  5/6 agitated remains on precedex  and HFNC   Interim History / Subjective:  Stable from a respiratory perspective after extubation though still requiring HFNC and precedex  for agitation   Objective   Blood pressure (!) 121/95, pulse 68, temperature 100.2 F (37.9 C), temperature source Axillary, resp. rate (!) 24, height 5\' 6"  (1.676 m), weight 86.8 kg, SpO2 95%.    FiO2 (%):  [40 %-50 %] 45 %   Intake/Output Summary (Last 24 hours) at 03/31/2024 0830 Last data filed at 03/31/2024 0600 Gross per 24 hour  Intake 1572.17 ml  Output 4450 ml  Net -2877.83 ml   Filed Weights   03/28/24 0414 03/29/24 0500 03/31/24 0500  Weight: 89.1 kg 92 kg 86.8 kg    Examination: General: Crtitically ill-appearing M, restless in bed and occasionally yessling  HEENT: [place]/AT, eyes anicteric, pupils equal  Neuro: alert, oriented to person and place, disoriented to situation, following commands in all extremities Chest: continued thick secretions  Heart: Regular  rate and rhythm, no murmurs or gallops, tachycardic with sedation wean Abdomen: Soft, nondistended, bowel sounds present Skin: defer to nursing exam  Labs: K 4.2 Mag 1.8 Phos 3.6 Creatinine 0.77 WBC 11.2  Micro 4/30 BC x1> Actinomyces  5/1 Respiratory cx> normal flora    Resolved Hospital Problem list   Shock  AKI  Lactic acidosis Hypophosphatemia Sinus bradycardia with frequent pauses especially with suction or changing position  Assessment & Plan:  Acute toxic encephalopathy in the setting  of amphetamine and cocaine abuse Possible focal seizures with Todd's paralysis 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, opiate induced Polysubstance abuse Alcohol  abuse with alcohol  withdrawal seizures Acute respiratory failure with hypoxia and hypercapnia Aspiration pneumonia Demand cardiac ischemia Chronic HFpEF Opiate dependence Anxiety disorder Exaggerated vagal tone Obesity Acute urinary retention Hypomagnesemia   -Extubated yesterday though still requiring 40L HFNC, check repeat CXR  -Does not appear to be in acute withdrawal, but has significant agitation requiring precedex , attempt to wean -Continue Keppra  1 g twice daily -question of opiate use at home, though urine tox is negative -MRI brain is consistent with findings of CHANTER which is opiate induced brain changes -Continue thiamine , folate and B12 -Neurology is following, appreciate recs EEG is negative for active seizures -complete Unasyn  7 days -Echocardiogram showed no wall motion abnormalities with grade 1 diastolic dysfunction -Continue Xanax , increased to home dose and added back Clonidine at decreased dose of 0.1mg  tid  -Patient was having frequent pauses due to exaggerated vagal tone, per neurology has hyperreflexia, CT cervical spine showing foraminal narrowing, MRI done without any cord impingement, this has now improved  -Closely monitor bladder scan and Continue doxazosin and Urecholine , foley removed last night, bladder scan prn  -trend and replete electrolytes prn   Best Practice (right click and "Reselect all SmartList Selections" daily)   Diet/type: tubefeeds and NPO w/ oral meds until passes swallow  DVT prophylaxis prophylactic heparin   Pressure ulcer(s): N/A GI prophylaxis: H2B Lines: N/A Foley:  N/A Code Status:  full code Last date of multidisciplinary goals of care discussion [4/30 updated son Veryl Gottron over phone], updated son via phone 5/5  Labs   CBC: Recent Labs  Lab 03/25/24 1655  03/25/24 1657 03/26/24 0507 03/27/24 0726 03/28/24 1055 03/29/24 1122 03/30/24 0647 03/30/24 1046 03/31/24 0620  WBC 15.0*  --  12.5* 10.3  --   --  8.7  --  11.2*  NEUTROABS 10.4*  --   --   --   --   --  4.4  --   --   HGB 12.8*   < > 10.7* 10.6* 9.2* 9.5* 10.3* 8.8* 11.0*  HCT 40.4   < > 32.2* 32.1* 27.0* 28.0* 31.8* 26.0* 32.6*  MCV 98.1  --  95.5 95.3  --   --  97.2  --  93.1  PLT 275  --  209 175  --   --  221  --  246   < > = values in this interval not displayed.    Basic Metabolic Panel: Recent Labs  Lab 03/25/24 1655 03/25/24 1657 03/25/24 1929 03/26/24 0507 03/26/24 1441 03/27/24 0726 03/28/24 0739 03/28/24 1055 03/29/24 1122 03/30/24 0647 03/30/24 1046 03/31/24 0620  NA 135 136  --  142  --  136  --  140 140 142 139 139  K 4.1 4.2  --  3.9  --  4.3  --  3.6 3.9 3.6 3.5 4.2  CL 99 101  --  107  --  107  --   --   --  107  --  104  CO2 15*  --   --  26  --  25  --   --   --  26  --  23  GLUCOSE 459* 453*  --  84  --  133*  --   --   --  111*  --  97  BUN 23* 25*  --  21*  --  19  --   --   --  13  --  12  CREATININE 1.96* 1.90*  --  1.25*  --  0.90  --   --   --  0.73  --  0.77  CALCIUM  8.6*  --   --  7.6*  --  7.9*  --   --   --  8.7*  --  8.7*  MG  --   --    < > 1.2*  --  2.2 2.0  --   --  1.7  --  1.8  PHOS  --   --   --   --  2.3* 1.9* 2.5  --   --  5.5*  --  3.6   < > = values in this interval not displayed.   GFR: Estimated Creatinine Clearance: 101.4 mL/min (by C-G formula based on SCr of 0.77 mg/dL). Recent Labs  Lab 03/26/24 0507 03/26/24 0844 03/27/24 0726 03/27/24 1238 03/27/24 2059 03/30/24 0647 03/31/24 0620  WBC 12.5*  --  10.3  --   --  8.7 11.2*  LATICACIDVEN 2.5* 4.8*  --  1.3 0.9  --   --     Liver Function Tests: Recent Labs  Lab 03/25/24 1655 03/26/24 0507 03/27/24 0726  AST 64* 48* 33  ALT 83* 59* 48*  ALKPHOS 39 24* 23*  BILITOT 0.7 1.0 1.1  PROT 6.9 4.7* 5.0*  ALBUMIN 3.9 2.5* 2.5*   No results for input(s):  "LIPASE", "AMYLASE" in the last 168 hours. Recent Labs  Lab 03/25/24 2327  AMMONIA 34    ABG    Component Value Date/Time   PHART 7.395 03/30/2024 1046   PCO2ART 45.7 03/30/2024 1046   PO2ART 60 (L) 03/30/2024 1046   HCO3 28.0 03/30/2024 1046   TCO2 29 03/30/2024 1046   ACIDBASEDEF 3.2 (H) 03/25/2024 2330   O2SAT 90 03/30/2024 1046     Coagulation Profile: Recent Labs  Lab 03/25/24 1655  INR 1.2    Cardiac Enzymes: Recent Labs  Lab 03/25/24 1929 03/27/24 0726  CKTOTAL 401* 543*    HbA1C: Hgb A1c MFr Bld  Date/Time Value Ref Range Status  03/25/2024 07:29 PM 5.4 4.8 - 5.6 % Final    Comment:    (NOTE) Pre diabetes:          5.7%-6.4%  Diabetes:              >6.4%  Glycemic control for   <7.0% adults with diabetes     CBG: Recent Labs  Lab 03/30/24 1547 03/30/24 1950 03/30/24 2354 03/31/24 0329 03/31/24 0730  GLUCAP 106* 109* 116* 108* 104*    CRITICAL CARE Performed by: Patt Boozer Rojelio Uhrich   Total critical care time: 34 minutes  Critical care time was exclusive of separately billable procedures and treating other patients.  Critical care was necessary to treat or prevent imminent or life-threatening deterioration.  Critical care was time spent personally by me on the following activities: development of treatment plan with patient and/or surrogate as well as nursing, discussions with consultants, evaluation of patient's response  to treatment, examination of patient, obtaining history from patient or surrogate, ordering and performing treatments and interventions, ordering and review of laboratory studies, ordering and review of radiographic studies, pulse oximetry and re-evaluation of patient's condition.   Patt Boozer Jaiyon Wander, PA-C [facility] See Amion for pager If no response to pager , please call [phone] until 7pm After 7:00 pm call Elink  [phone]

## 2024-03-31 NOTE — Progress Notes (Signed)
 Nutrition Follow-up  DOCUMENTATION CODES:   Not applicable  INTERVENTION:  Initiate tube feeding via Cortrak tube once placed: Osmolite 1.5 at 20 ml/h and increase by 10 ml every 4 hours to goal rate of 50 ml/hr (1200 ml per day) ProSource TF20 60 ml BID Provides 1960 kcal, 115 gm protein, 912 ml free water  daily  Continue MVI with minerals/thiamine /folic acid    NUTRITION DIAGNOSIS:  Inadequate oral intake related to inability to eat as evidenced by NPO status. - Ongoing  GOAL:  Patient will meet greater than or equal to 90% of their needs  MONITOR:   TF tolerance  REASON FOR ASSESSMENT:   Consult Enteral/tube feeding initiation and management  ASSESSMENT:   Pt with PMH of alcohol  use with alcohol  withdrawal and withdrawal seizure, tobacco/marijuana abuse, HTN, anxiety/depression who lives with his son who reports decreased regular drinking now that pt out of a job admitted with acute encephalopathy due to polysubstance use.   4/30 - Admitted 5/01 - Tube Feeds initiated 5/05 - Extubated 5/06 - BSE - NPO  Pt sleeping at time of RD visit, did not wake to voice. No family at bedside.  SLP recommend NPO at this time. Discussed placement of Cortrak with CCM PA. Plan to place Cortrak tomorrow unless significant improvement in mentation.   Admission Weight: 81.3 kg Current Weight: 86.8 kg (5/6)  Medications reviewed and include: Vitamin B12, Colace, Pepcid , Folic Acid , NovoLog  SSI, MVI, Miralax , Senokot, Thiamine , IV antibiotics  Labs reviewed: Sodium 139, Potassium 4.2, Phosphorus 3.6, Magnesium  1.8 CBG: 94-116 x 24 hrs  Diet Order:   Diet Order             Diet NPO time specified  Diet effective now                  EDUCATION NEEDS:   Not appropriate for education at this time  Skin:  Skin Assessment: Reviewed RN Assessment  Last BM:  5/5  Height:  Ht Readings from Last 1 Encounters:  03/25/24 5\' 6"  (1.676 m)   Weight:  Wt Readings from Last 1  Encounters:  03/31/24 86.8 kg   BMI:  Body mass index is 30.89 kg/m.  Estimated Nutritional Needs:  Kcal:  1900-2200 Protein:  105-120 grams Fluid:  >2L/day   Doneta Furbish RD, LDN Clinical Dietitian

## 2024-03-31 NOTE — TOC CM/SW Note (Signed)
 Transition of Care [facility]) - Inpatient Brief Assessment   Patient Details  Name: Ricky Werner MRN: [id number] Date of Birth: [birth year]  [facility]) CM/SW Contact:    Jannice Mends, LCSW Phone Number: 03/31/2024, 4:02 PM   Clinical Narrative: CSW notes SNF recommendation. CSW will continue to follow for medical readiness. Multiple barriers in place for SNF, including insurance.    Transition of Care Asessment: Insurance and Status: Insurance coverage has been reviewed Patient has primary care physician: Yes   Prior level of function:: independent Prior/Current Home Services: No current home services Social Drivers of Health Review: SDOH reviewed no interventions necessary (questions not asked yet pt intubated) Readmission risk has been reviewed: Yes Transition of care needs: transition of care needs identified, TOC will continue to follow

## 2024-03-31 NOTE — Evaluation (Signed)
 Physical Therapy Evaluation Patient Details Name: Ricky Werner MRN: [id number] DOB: 1966/05/14 Today's Date: 03/31/2024  History of Present Illness  The pt is a 58 yo male presenting 4/30 after being found unresponsive. Work up revealed L-sided weakness, CT head negative. UDS + for cocaine, benzo, amphetamines. Pt admitted for management of seizure with post-ictal Todd's paralysis and toxic metabolic encephalopathy in the setting of hyperglycemia and cocaine abuse. ETT 4/30-5/5. PMH includes: alcohol  abuse and benzodiazepine dependence, cigarette smoking, HTN, alcohol  withdrawal seizures as well as concern for unprovoked seizures on Keppra .    Clinical Impression  Pt in bed upon arrival of PT, agreeable to evaluation at this time. Pt initially alert and conversational, noted to have deficits in orientation, awareness, sequencing, and memory. Pt with limited participation in LE strength testing, but did allow for full PROM to bilateral LE. As pt positioned in chair position in bed, all VSS, but pt with progressive agitation, sitting himself up to 'criss-cross-applesauce' position without posterior support. Pt then attempting to pull at [facility] and other lines while attempting to get out of bed so was assisted back to semi-reclined position in 5-point restraints. During attempts to restrain, pt able to kick out at therapist with good speed and power showing good ROM and strength in RLE. Full evaluation limited due to limited activity tolerance and agitation, will benefit from continued rehab and post-acute rehab <3hours/day.      If plan is discharge home, recommend the following: Two people to help with walking and/or transfers;Two people to help with bathing/dressing/bathroom;Assistance with cooking/housework;Direct supervision/assist for medications management;Direct supervision/assist for financial management;Assist for transportation;Help with stairs or ramp for entrance;Supervision due to cognitive  status   Can travel by private vehicle   No    Equipment Recommendations Other (comment) (defer until further mobility can be assessed safely, at this time would be lift and hospital bed)  Recommendations for Other Services       Functional Status Assessment Patient has had a recent decline in their functional status and demonstrates the ability to make significant improvements in function in a reasonable and predictable amount of time.     Precautions / Restrictions Precautions Precautions: Fall;Other (comment) (seizure) Recall of Precautions/Restrictions: Impaired Precaution/Restrictions Comments: 5 point restraints, rectal pouch, condom foley Restrictions Weight Bearing Restrictions Per Provider Order: No      Mobility  Bed Mobility Overal bed mobility: Needs Assistance Bed Mobility: Rolling, Supine to Sit Rolling: Max assist, +2 for physical assistance   Supine to sit: Mod assist, +2 for safety/equipment, HOB elevated, Used rails     General bed mobility comments: Pt rolling toward the L better than R. pt rolling R and L and states "that doesnt hurt" once HOB increased, pt pulling forwards to sitting criss-cross-applesauce, no LOB.attempting to pull forwards to get out of bed and then manually assisted back to supine and into restraints for safety    Transfers                   General transfer comment: unable to attempt further due to increased agitation adn kicking at therapists      Balance Overall balance assessment: Needs assistance Sitting-balance support: No upper extremity supported, Feet supported Sitting balance-Leahy Scale: Fair Sitting balance - Comments: sitting in bed but without posterior support. limited assessment by poor command following and pt impulsiveness/agitation  Pertinent Vitals/Pain Pain Assessment Faces Pain Scale: No hurt    Home Living Family/patient expects to be  discharged to:: Private residence Living Arrangements: Children                 Additional Comments: attempted to call son with no answer 10:22am    Prior Function Prior Level of Function : Independent/Modified Independent;Patient poor historian/Family not available                     Extremity/Trunk Assessment   Upper Extremity Assessment Upper Extremity Assessment: Defer to OT evaluation    Lower Extremity Assessment Lower Extremity Assessment: Difficult to assess due to impaired cognition (pt not following commands for MMT or true testing. initially allowing therapist to move BLE through full PROM without attempting to assist. later pt able to sit criss-cross-applesause without assistance and attempting to kick at therapist with good power)    Cervical / Trunk Assessment Cervical / Trunk Assessment: Normal  Communication   Communication Communication: Impaired Factors Affecting Communication: Reduced clarity of speech    Cognition Arousal: Lethargic Behavior During Therapy: Agitated, Impulsive   PT - Cognitive impairments: Orientation, Awareness, Attention, Memory, Sequencing, Problem solving, Safety/Judgement   Orientation impairments: Person, Place, Time, Situation                   PT - Cognition Comments: pt not oriented to his on age or birth year, initially following a few commands an answering questions. increasing agitation through session eventually kicking at therapist Following commands: Impaired Following commands impaired: Follows one step commands inconsistently     Cueing Cueing Techniques: Tactile cues, Verbal cues     General Comments General comments (skin integrity, edema, etc.): HHFNC 30L 51% (48-55 range)    Exercises     Assessment/Plan    PT Assessment Patient needs continued PT services  PT Problem List Decreased activity tolerance;Decreased balance;Decreased mobility;Decreased cognition;Decreased safety  awareness;Decreased knowledge of precautions;Cardiopulmonary status limiting activity       PT Treatment Interventions DME instruction;Gait training;Stair training;Functional mobility training;Therapeutic activities;Therapeutic exercise;Balance training;Cognitive remediation;Patient/family education    PT Goals (Current goals can be found in the Care Plan section)  Acute Rehab PT Goals Patient Stated Goal: to go home PT Goal Formulation: With patient Time For Goal Achievement: 04/14/24 Potential to Achieve Goals: Fair    Frequency Min 2X/week     Co-evaluation   Reason for Co-Treatment: For patient/therapist safety;Necessary to address cognition/behavior during functional activity;Complexity of the patient's impairments (multi-system involvement)   OT goals addressed during session: ADL's and self-care       AM-PAC PT "6 Clicks" Mobility  Outcome Measure Help needed turning from your back to your side while in a flat bed without using bedrails?: A Lot Help needed moving from lying on your back to sitting on the side of a flat bed without using bedrails?: A Lot Help needed moving to and from a bed to a chair (including a wheelchair)?: Total Help needed standing up from a chair using your arms (e.g., wheelchair or bedside chair)?: Total Help needed to walk in hospital room?: Total Help needed climbing 3-5 steps with a railing? : Total 6 Click Score: 8    End of Session Equipment Utilized During Treatment: Oxygen;Other (comment) Activity Tolerance: Treatment limited secondary to agitation Patient left: in bed;with call bell/phone within reach;with bed alarm set;with restraints reapplied Nurse Communication: Mobility status PT Visit Diagnosis: Unsteadiness on feet (R26.81);Other abnormalities of gait and mobility (R26.89)  Time: 1610-9604 PT Time Calculation (min) (ACUTE ONLY): 20 min   Charges:   PT Evaluation $PT Eval High Complexity: 1 High   PT General Charges $$  ACUTE PT VISIT: 1 Visit         Barnabas Booth, PT, DPT   Acute Rehabilitation Department Office [phone] Secure Chat Communication Preferred  Lona Rist 03/31/2024, 1:55 PM

## 2024-03-31 NOTE — Evaluation (Addendum)
 Occupational Therapy Evaluation Patient Details Name: Ricky Werner MRN: [id number] DOB: [birth year] Today's Date: 03/31/2024   History of Present Illness   The pt is a 58 yo male presenting 4/30 after being found unresponsive. Work up revealed L-sided weakness, CT head negative. UDS + for cocaine, benzo, amphetamines. Pt admitted for management of seizure with post-ictal Todd's paralysis and toxic metabolic encephalopathy in the setting of hyperglycemia and cocaine abuse. ETT 4/30-5/5. PMH includes: alcohol  abuse and benzodiazepine dependence, cigarette smoking, HTN, alcohol  withdrawal seizures as well as concern for unprovoked seizures on Keppra      Clinical Impressions PT admitted with seizure postical Todds paralysis and toxic metabolic encephalopathy. Pt currently with functional limitiations due to the deficits listed below (see OT problem list). Pt demonstrates cognitive deficits and very impulsive. Attempted bed level movement with sudden agitation and kicking at staff.  Pt will benefit from skilled OT to increase their independence and safety with adls and balance to allow discharge skilled inpatient follow up therapy, <3 hours/day pending progress.      If plan is discharge home, recommend the following:   Two people to help with walking and/or transfers;Two people to help with bathing/dressing/bathroom     Functional Status Assessment   Patient has had a recent decline in their functional status and demonstrates the ability to make significant improvements in function in a reasonable and predictable amount of time.     Equipment Recommendations   Other (comment) (tba when more cooperative)     Recommendations for Other Services   Speech consult     Precautions/Restrictions   Precautions Precautions: Fall;Other (comment) (seizure) Recall of Precautions/Restrictions: Impaired Precaution/Restrictions Comments: 5 point restraints, rectal pouch, condom foley,      Mobility Bed Mobility Overal bed mobility: Needs Assistance Bed Mobility: Rolling Rolling: Max assist, +2 for physical assistance         General bed mobility comments: Pt rolling toward the L better than R. pt rolling R and L and states "that doesnt hurt"    Transfers                   General transfer comment: unable to attempt further due to increased agitation adn kicking at therapists      Balance                                           ADL either performed or assessed with clinical judgement   ADL                                         General ADL Comments: due to agitation and confusion will be total (A) for adls . Pt is npo.     Vision   Additional Comments: pt with eyes close majority of session. pt visually scanning surface for objects not present     Perception         Praxis         Pertinent Vitals/Pain Pain Assessment Pain Assessment: Faces Faces Pain Scale: No hurt     Extremity/Trunk Assessment Upper Extremity Assessment Upper Extremity Assessment: Overall WFL for tasks assessed (attempting to pull and reach with UE. required restraint for safety)   Lower Extremity Assessment Lower Extremity Assessment: Defer to PT evaluation (pt needed (A)  initially but later in the session able to hip flex knee flex and strongly kick in the direction of therapist showing localization to stimuli)   Cervical / Trunk Assessment Cervical / Trunk Assessment: Normal   Communication Communication Communication: Impaired Factors Affecting Communication: Reduced clarity of speech   Cognition Arousal: Lethargic Behavior During Therapy: Agitated, Impulsive Cognition: Cognition impaired, Difficult to assess   Orientation impairments: Place, Time, Person, Situation Awareness: Intellectual awareness impaired, Online awareness impaired   Attention impairment (select first level of impairment): Focused attention    OT - Cognition Comments: reports that he is 58 yo, when asked location he states "hell if i know" pt reports DOB incorrectly. pt insisiting he sees lice on the bed surface and that staff are trying to kill him.                 Following commands: Impaired Following commands impaired: Follows one step commands inconsistently     Cueing  General Comments   Cueing Techniques: Tactile cues;Verbal cues  HHFNC 30L 51% (48-55 range)   Exercises     Shoulder Instructions      Home Living Family/patient expects to be discharged to:: Private residence Living Arrangements: Children                               Additional Comments: attempted to call son with no answer 10:22am      Prior Functioning/Environment Prior Level of Function : Independent/Modified Independent                    OT Problem List: Impaired balance (sitting and/or standing);Decreased coordination;Decreased cognition;Decreased safety awareness;Decreased knowledge of use of DME or AE;Decreased knowledge of precautions;Cardiopulmonary status limiting activity;Obesity   OT Treatment/Interventions: Self-care/ADL training;Therapeutic exercise;Neuromuscular education;Energy conservation;DME and/or AE instruction;Manual therapy;Modalities;Therapeutic activities;Cognitive remediation/compensation;Visual/perceptual remediation/compensation;Balance training;Patient/family education      OT Goals(Current goals can be found in the care plan section)   Acute Rehab OT Goals Patient Stated Goal: none stated OT Goal Formulation: Patient unable to participate in goal setting Time For Goal Achievement: 04/14/24 Potential to Achieve Goals: Good   OT Frequency:  Min 2X/week    Co-evaluation PT/OT/SLP Co-Evaluation/Treatment: Yes Reason for Co-Treatment: For patient/therapist safety;Necessary to address cognition/behavior during functional activity;Complexity of the patient's impairments  (multi-system involvement)   OT goals addressed during session: ADL's and self-care      AM-PAC OT "6 Clicks" Daily Activity     Outcome Measure Help from another person eating meals?: A Lot Help from another person taking care of personal grooming?: A Lot Help from another person toileting, which includes using toliet, bedpan, or urinal?: Total Help from another person bathing (including washing, rinsing, drying)?: Total Help from another person to put on and taking off regular upper body clothing?: Total Help from another person to put on and taking off regular lower body clothing?: Total 6 Click Score: 8   End of Session Nurse Communication: Mobility status;Precautions  Activity Tolerance: Patient tolerated treatment well Patient left: in bed;with call bell/phone within reach;with bed alarm set;with restraints reapplied  OT Visit Diagnosis: Unsteadiness on feet (R26.81);Muscle weakness (generalized) (M62.81)                Time: 8295-6213 OT Time Calculation (min): 20 min Charges:  OT General Charges $OT Visit: 1 Visit OT Evaluation $OT Eval Moderate Complexity: 1 Mod   Brynn, OTR/L  Acute Rehabilitation Services Office: [phone] .  Neomia Banner 03/31/2024, 10:35 AM

## 2024-03-31 NOTE — Procedures (Signed)
 Patient Name: HAZIM MORTER  MRN: [id number]  Epilepsy Attending: Arleene Lack  Referring Physician/Provider: Tona Francis, MD  Duration: 03/30/2024 1130 to 03/30/2024  1522   Patient history: 58yo M with left-sided weakness. EEG to evaluate for seizure   Level of alertness: comatose/lethargic   AEDs during EEG study: Xanax , LEV, propofol   Technical aspects: This EEG study was done with scalp electrodes positioned according to the 10-20 International system of electrode placement. Electrical activity was reviewed with band pass filter of 1-70Hz , sensitivity of 7 uV/mm, display speed of 70mm/sec with a 60Hz  notched filter applied as appropriate. EEG data were recorded continuously and digitally stored.  Video monitoring was available and reviewed as appropriate.   Description: EEG showed continuous generalized 5 to 9 Hz theta- alpha activty admixed with intermittent 2-3hz  delta slowing. Hyperventilation and photic stimulation were not performed.      ABNORMALITY - Continuous slow, generalized   IMPRESSION: This study is suggestive of moderate diffuse encephalopathy, likely related to sedation. No seizures or epileptiform discharges were seen throughout the recording.   Krishana Lutze O Herberto Ledwell

## 2024-04-01 ENCOUNTER — Encounter (HOSPITAL_COMMUNITY): Payer: Self-pay

## 2024-04-01 ENCOUNTER — Other Ambulatory Visit: Payer: Self-pay

## 2024-04-01 DIAGNOSIS — N179 Acute kidney failure, unspecified: Secondary | ICD-10-CM | POA: Diagnosis not present

## 2024-04-01 DIAGNOSIS — J9601 Acute respiratory failure with hypoxia: Secondary | ICD-10-CM | POA: Diagnosis not present

## 2024-04-01 DIAGNOSIS — G8384 Todd's paralysis (postepileptic): Secondary | ICD-10-CM | POA: Diagnosis not present

## 2024-04-01 DIAGNOSIS — R569 Unspecified convulsions: Secondary | ICD-10-CM | POA: Diagnosis not present

## 2024-04-01 DIAGNOSIS — G928 Other toxic encephalopathy: Secondary | ICD-10-CM | POA: Diagnosis not present

## 2024-04-01 DIAGNOSIS — F1410 Cocaine abuse, uncomplicated: Secondary | ICD-10-CM | POA: Diagnosis not present

## 2024-04-01 DIAGNOSIS — F10139 Alcohol abuse with withdrawal, unspecified: Secondary | ICD-10-CM | POA: Diagnosis not present

## 2024-04-01 LAB — CBC
HCT: 33.1 % — ABNORMAL LOW (ref 39.0–52.0)
Hemoglobin: 11.1 g/dL — ABNORMAL LOW (ref 13.0–17.0)
MCH: 30.9 pg (ref 26.0–34.0)
MCHC: 33.5 g/dL (ref 30.0–36.0)
MCV: 92.2 fL (ref 80.0–100.0)
Platelets: 282 10*3/uL (ref 150–400)
RBC: 3.59 MIL/uL — ABNORMAL LOW (ref 4.22–5.81)
RDW: 13.3 % (ref 11.5–15.5)
WBC: 13.5 10*3/uL — ABNORMAL HIGH (ref 4.0–10.5)
nRBC: 0 % (ref 0.0–0.2)

## 2024-04-01 LAB — GLUCOSE, CAPILLARY
Glucose-Capillary: 97 mg/dL (ref 70–99)
Glucose-Capillary: 90 mg/dL (ref 70–99)
Glucose-Capillary: 110 mg/dL — ABNORMAL HIGH (ref 70–99)
Glucose-Capillary: 116 mg/dL — ABNORMAL HIGH (ref 70–99)
Glucose-Capillary: 116 mg/dL — ABNORMAL HIGH (ref 70–99)
Glucose-Capillary: 135 mg/dL — ABNORMAL HIGH (ref 70–99)

## 2024-04-01 LAB — LEVETIRACETAM LEVEL: Levetiracetam Lvl: 6 ug/mL — ABNORMAL LOW (ref 10.0–40.0)

## 2024-04-01 LAB — MAGNESIUM: Magnesium: 2 mg/dL (ref 1.7–2.4)

## 2024-04-01 LAB — TRIGLYCERIDES: Triglycerides: 188 mg/dL — ABNORMAL HIGH (ref <150)

## 2024-04-01 MED ORDER — PNEUMOCOCCAL 20-VAL CONJ VACC 0.5 ML IM SUSY
0.5000 mL | PREFILLED_SYRINGE | INTRAMUSCULAR | Status: AC
  Administered 2024-04-02: 0.5 mL via INTRAMUSCULAR
  Filled 2024-04-01: qty 0.5

## 2024-04-01 MED ORDER — ACETAMINOPHEN 325 MG PO TABS
650.0000 mg | ORAL_TABLET | Freq: Four times a day (QID) | ORAL | Status: DC | PRN
  Administered 2024-04-01: 650 mg via ORAL
  Filled 2024-04-01: qty 2

## 2024-04-01 MED ORDER — ALPRAZOLAM 0.5 MG PO TABS
1.0000 mg | ORAL_TABLET | Freq: Three times a day (TID) | ORAL | Status: DC
  Administered 2024-04-01 – 2024-04-03 (×7): 1 mg
  Filled 2024-04-01 (×7): qty 2

## 2024-04-01 NOTE — Progress Notes (Signed)
 Speech Language Pathology Treatment: Dysphagia  Patient Details Name: Ricky Werner MRN: [id number] DOB: 10-03-1966 Today's Date: 04/01/2024 Time: 4098-1191 SLP Time Calculation (min) (ACUTE ONLY): 12 min  Assessment / Plan / Recommendation Clinical Impression  Pt's mentation continues to wax and wane. Today, he seemed more lethargic and fell asleep quickly when stimulation was reduced. Supplemental O2 requirements have decreased, now on 4L HFNC. He took sips of thin liquids without signs concerning for aspiration but did have increased instances of oral holding with solids. Again, his current mentation does not support initiation of a PO diet and he has very little interest in POs overall. SLP will continue following but did discuss ability to advance diet if mentation improves with RN.    HPI HPI: Ricky Werner is a 58 yo male presenting 4/30 after being found unresponsive. W/u revealed L sided weakness, CTH negative. CT Chest pending. UDS + for cocaine, benzo, amphetamines. Admitted for managements of seizure with post-ictal Todd's paralysis and toxic metabolic encephalopathy in the setting of hyperglycemia and cocaine abuse. ETT 4/30-5/5. PMH includes alcohol  abuse and benzodiazepine dependence, tobacco use, HTN, alcohol  withdrawal seizures as well as concern for unprovoked seizures on Keppra       SLP Plan  Continue with current plan of care      Recommendations for follow up therapy are one component of a multi-disciplinary discharge planning process, led by the attending physician.  Recommendations may be updated based on patient status, additional functional criteria and insurance authorization.    Recommendations  Diet recommendations: NPO Medication Administration: Whole meds with puree                  Oral care QID;Oral care prior to ice chip/H20   Frequent or constant Supervision/Assistance Dysphagia, unspecified (R13.10)     Continue with current plan of care      Amil Kale, M.A., CCC-SLP Speech Language Pathology, Acute Rehabilitation Services  Secure Chat preferred [phone]   04/01/2024, 11:32 AM

## 2024-04-01 NOTE — Progress Notes (Signed)
 NEUROLOGY CONSULT FOLLOW UP NOTE   Date of service: Apr 01, 2024 Patient Name: Ricky Werner MRN:  [id number] DOB:  May 23, 1966  Interval Hx/subjective   No family at the bedside. Patient is laying in the bed in NAD. He just had a coretrak tube placed. RN has turned off his precedex  gtt this morning. RN at the bedside. No new neurological events overnight He is lethargic can answer simple questions and follow commands. Moving all extremities   Vitals   Vitals:   04/01/24 0812 04/01/24 0849 04/01/24 0921 04/01/24 1000  BP:   123/80 114/73  Pulse: 66 67 73 67  Resp: (!) 24 16 (!) 22 20  Temp:      TempSrc:      SpO2: 97% 100% 100% 99%  Weight:      Height:         Body mass index is 29 kg/m.  Physical Exam   Constitutional: Appears well-developed and well-nourished.  Psych: Affect appropriate to situation.  Eyes: No scleral injection.  HENT: No OP obstrucion.  Head: Normocephalic.  Cardiovascular: Normal rate and regular rhythm.  Respiratory: Effort normal, non-labored breathing.  GI: Soft.  No distension. There is no tenderness.  Skin: WDI.   Neurologic Examination    Mental status: lethargic, Opens eyes spontaneously, can answer simple questions. follows commands, naming intact  CN:  Control and instrumentation engineer, PERRL Blinks to threat bilaterally Facial movement intact and symmetric Hearing intact to voice Cough and gag intact Tongue protrusion midline Motor/Sensory:  Spontaneous movement against gravity of all extremities Cerebellar: no overt ataxia seen.  Gait: Unable to assess  Medications  Current Facility-Administered Medications:    ALPRAZolam  (XANAX ) tablet 1 mg, 1 mg, Per Tube, TID, Ricky Fudge, MD   Chlorhexidine  Gluconate Cloth 2 % PADS 6 each, 6 each, Topical, Q2200, [name], John D, PA-C, 6 each at 03/31/24 1300   cloNIDine (CATAPRES) tablet 0.1 mg, 0.1 mg, Per Tube, TID, Ricky Fudge, MD, 0.1 mg at 04/01/24 0831   cyanocobalamin  (VITAMIN B12) tablet 1,000  mcg, 1,000 mcg, Per Tube, Daily, Gleason, Patt Boozer, PA-C, 1,000 mcg at 04/01/24 0831   escitalopram  (LEXAPRO ) tablet 10 mg, 10 mg, Per Tube, QHS, Gleason, Laura R, PA-C, 10 mg at 03/31/24 2134   feeding supplement (OSMOLITE 1.5 CAL) liquid 1,000 mL, 1,000 mL, Per Tube, Continuous, Aleck Hurdle, MD, Last Rate: 50 mL/hr at 04/01/24 1012, 1,000 mL at 04/01/24 1012   feeding supplement (PROSource TF20) liquid 60 mL, 60 mL, Per Tube, BID, Desai, Nikita S, MD, 60 mL at 04/01/24 4098   folic acid  (FOLVITE ) tablet 1 mg, 1 mg, Per Tube, Daily, Gleason, Patt Boozer, PA-C, 1 mg at 04/01/24 0831   haloperidol lactate (HALDOL) injection 5 mg, 5 mg, Intravenous, Q6H PRN, Ricky Fudge, MD, 5 mg at 03/31/24 1618   heparin  injection 5,000 Units, 5,000 Units, Subcutaneous, Q8H, [name], John D, PA-C, 5,000 Units at 04/01/24 1191   insulin  aspart (novoLOG ) injection 0-9 Units, 0-9 Units, Subcutaneous, Q4H, [name], John D, PA-C, 1 Units at 03/30/24 0818   ipratropium-albuterol  (DUONEB) 0.5-2.5 (3) MG/3ML nebulizer solution 3 mL, 3 mL, Nebulization, Q4H PRN, Bowser, Grace E, NP, 3 mL at 03/30/24 0802   levETIRAcetam  (KEPPRA ) undiluted injection 1,000 mg, 1,000 mg, Intravenous, Q12H, Oralee Billow I, RPH, 1,000 mg at 04/01/24 4782   multivitamin with minerals tablet 1 tablet, 1 tablet, Per Tube, Daily, [name], John D, PA-C, 1 tablet at 04/01/24 0831   Oral care mouth rinse, 15 mL, Mouth  Rinse, PRN, Ricky Fudge, MD   oxyCODONE  (Oxy IR/ROXICODONE ) immediate release tablet 5-10 mg, 5-10 mg, Per Tube, Q4H PRN, Gleason, Patt Boozer, PA-C   PHENObarbital (LUMINAL) injection 97.5 mg, 97.5 mg, Intravenous, Q8H, 97.5 mg at 04/01/24 0833 **FOLLOWED BY** [START ON 04/02/2024] PHENObarbital (LUMINAL) injection 65 mg, 65 mg, Intravenous, Q8H **FOLLOWED BY** [START ON 04/04/2024] PHENObarbital (LUMINAL) injection 32.5 mg, 32.5 mg, Intravenous, Q8H, Bitonti, Michael T, RPH   polyethylene glycol (MIRALAX  / GLYCOLAX ) packet 17 g, 17 g, Per Tube,  Daily PRN, Oralee Billow I, RPH   polyethylene glycol (MIRALAX  / GLYCOLAX ) packet 17 g, 17 g, Per Tube, Daily, Gleason, Patt Boozer, PA-C   QUEtiapine (SEROQUEL) tablet 100 mg, 100 mg, Per Tube, BID, Gleason, Patt Boozer, PA-C, 100 mg at 04/01/24 0831   senna (SENOKOT) tablet 17.2 mg, 2 tablet, Per Tube, QHS, Gleason, Patt Boozer, PA-C, 17.2 mg at 03/31/24 2133   sodium chloride  HYPERTONIC 3 % nebulizer solution 4 mL, 4 mL, Nebulization, Daily, Zaida Hertz, Alease Hunter, MD, 4 mL at 04/01/24 2130   thiamine  (VITAMIN B1) tablet 100 mg, 100 mg, Per Tube, Daily, Gleason, Patt Boozer, PA-C, 100 mg at 04/01/24 0831  Labs and Diagnostic Imaging   CBC:  Recent Labs  Lab 03/25/24 1655 03/25/24 1657 03/30/24 0647 03/30/24 1046 03/31/24 0620 04/01/24 0506  WBC 15.0*   < > 8.7  --  11.2* 13.5*  NEUTROABS 10.4*  --  4.4  --   --   --   HGB 12.8*   < > 10.3*   < > 11.0* 11.1*  HCT 40.4   < > 31.8*   < > 32.6* 33.1*  MCV 98.1   < > 97.2  --  93.1 92.2  PLT 275   < > 221  --  246 282   < > = values in this interval not displayed.    Basic Metabolic Panel:  Lab Results  Component Value Date   NA 137 03/31/2024   K 3.8 03/31/2024   CO2 23 03/31/2024   GLUCOSE 101 (H) 03/31/2024   BUN 14 03/31/2024   CREATININE 0.93 03/31/2024   CALCIUM  8.9 03/31/2024   GFRNONAA >60 03/31/2024   GFRAA >60 08/16/2020   Lipid Panel: No results found for: "LDLCALC" HgbA1c:  Lab Results  Component Value Date   HGBA1C 5.4 03/25/2024   Urine Drug Screen:     Component Value Date/Time   LABOPIA NONE DETECTED 03/25/2024 2143   COCAINSCRNUR POSITIVE (A) 03/25/2024 2143   LABBENZ POSITIVE (A) 03/25/2024 2143   AMPHETMU POSITIVE (A) 03/25/2024 2143   THCU NONE DETECTED 03/25/2024 2143   LABBARB NONE DETECTED 03/25/2024 2143    Alcohol  Level     Component Value Date/Time   ETH <15 03/25/2024 1655   INR  Lab Results  Component Value Date   INR 1.2 03/25/2024   APTT  Lab Results  Component Value Date   APTT 24  03/25/2024   AED levels: No results found for: "PHENYTOIN", "ZONISAMIDE", "LAMOTRIGINE", "LEVETIRACETA"  Imaging: - MRI shows concern for opoid-overdose related changes (CHANTER). Not on opioids at home and UDS was negative for opiates (positive for cocaine, amphet and benzo) but our tox screen is not sensitive for fentanyl .  - MRA negative. - MRI C-spine: Normal cord signal   - UDS was positive for benzodiazepine, cocaine and amphetamine (on admission). He does have a Xanax  prescription, but no rx for Adderall. Suspect possible substance-related seizure at home versus toxidrome or a combination of both.    -  No seizures seen on multiple days of LTM.     Assessment   ISAAH VERONA is a 58 y.o. male with past medical history significant for alcohol  abuse and benzodiazepine dependence, cigarette smoking, hypertension, alcohol  withdrawal seizures as well as concern for unprovoked seizures, on Keppra  outpatient.    Per ED report, after intubation he was agitated, moving all 4 extremities grossly equally. Per Teleneurology note, it was felt that his presentation was most likely due to a focal seizure with rapidly resolving post-ictal Todd's paralysis. He was loaded with Keppra  2000 mg at OSH prior to transfer to [facility], continued on Keppra  1,000 BID (home dose).   - Imaging: - MRI shows concern for opoid-overdose related changes (CHANTER). Not on opioids at home and UDS was negative for opiates (positive for cocaine, amphet and benzo) but our tox screen is not sensitive for fentanyl .  - MRA negative. - MRI C-spine: Normal cord signal   - UDS was positive for benzodiazepine, cocaine and amphetamine (on admission). He does have a Xanax  prescription, but no rx for Adderall. Suspect possible substance-related seizure at home versus toxidrome or a combination of both.    - No seizures seen on multiple days of LTM.    - Impressions:  - Acute presentation most likely secondary to focal seizure with  post-ictal Todd's paralysis. Seizures possibly due to alcohol  withdrawal as patient has had these in the past, and family informed teleneurology that he has not been drinking recently. Lowered seizure threshold secondary to hyperglycemia.  - Toxic metabolic encephalopathy in the setting of hyperglycemia and cocaine abuse as well as possible breakthrough seizure due to the same. - Possible missed medications secondary to baseline memory issues. - Other comorbidities: Concern for renal dysfunction (AKI vs. CKD, no recent labs) . Hypoxia secondary to pneumonia, likely aspiration.   Recommendations   - Continue pneumonia treatment and infectious workup, as infection can further lower seizure threshold. Avoid cefepime.  - Continue thiamine  and folic acid  supplementation - continue electrolyte derangement correction - Continue Keppra  1000 mg BID (home dose). IV while no PO access. Transition to PO when able. - Continue Xanax  2mg  TID (home dose) when able to take PO/Per Tube.               pehnobarb taper for benzo withdrawal taper while unable to take this medication.  - Continue Inpatient seizure precautions - Outpatient seizure precautions should be discussed with the patient when he is awake.  - Substance abuse education when appropriate - Will take time for encephalopathy to improve suspect combination of withdrawal, agitated delirium, post ictal and substance use.   ______________________________________________________________________   Signed, Laymond Priestly, NP [facility]  NEUROHOSPITALIST ADDENDUM Performed a face to face diagnostic evaluation.   I have reviewed the contents of history and physical exam as documented by PA/ARNP/Resident and agree with above documentation.  I have discussed and formulated the above plan as documented. Edits to the note have been made as needed.  Dasja Brase, MD [facility] [phone]   If 7pm to 7am, please call on  call as listed on AMION.

## 2024-04-01 NOTE — Progress Notes (Signed)
 NAME:  Ricky Werner, MRN:  [id number], DOB:  11/01/1966, LOS: 7 ADMISSION DATE:  03/25/2024, CONSULTATION DATE:  4/30 REFERRING MD:  Dr. Kermit Werner , CHIEF COMPLAINT:  unresponsive; possible seizure; acute respiratory failure   History of Present Illness:  Patient is a 58 year old male with pertinent PMH alcohol  use with history of alcohol  withdrawal and withdrawal seizure, tobacco/marijuana abuse, HTN, anxiety/depression presents to [facility] ED on 4/30 unresponsive.  Patient has history of alcohol  withdrawal seizures last admitted on 2018.  On Keppra .  Patient lives with son.  Family states he has not been drinking regularly since he has been out of work.  On 4/30 patient LKN an hour and a half ago and son found patient on floor unresponsive.  Patient started CPR.  EMS arrived and patient unresponsive but had a pulse.  Patient given Narcan with no effect.  Patient noted to be bradycardic and placed on supplemental O2.  Transported to [facility] ED.  On arrival BP soft.  GCS 7.  Localizes to pain on right side.  On NRB with sats mid 80s requiring intubation.  Patient required levo for hypotension.  CT head no acute findings.  Neuro consulted recommending MRI.  Gave Keppra  load.  Started on thiamine . Ethanol wnl.  UDS and Keppra  level pending.  CT chest showing peribronchovascular infiltrates versus atelectasis in L>R favoring aspiration. Wbc 15. Cultures obtained and started on rocephin /azithro. Glucose 459, co2 15, AG 21. UA, Beta H, Osm pending. Given insulin .   Pertinent ED Labs: Creat 1.96, AST 64, ALT 83,  Pertinent  Medical History   Past Medical History:  Diagnosis Date   Alcohol  abuse    Anxiety    BPH (benign prostatic hyperplasia)    Carpal tunnel syndrome on right    Chronic back pain    Depression    History of dental problems    Hypertension    Osteoarthritis    Panic attacks    Right tennis elbow    Sciatica    Seizures (HCC) 09/30/2020   Vertigo      Significant Hospital  Events: Including procedures, antibiotic start and stop dates in addition to other pertinent events   4/30 admitted w/ acute encephalopathy intubated 5/1 weaning sedation. Decr FiO2 to 60%. MRI/A 5/2 near arrest after being repositioned. Brady w pauses, hypoxemic, biting tube.  5/3 continue to have thick secretions via ET tube, gets bradycardic with cough and suction 5/5 trial precedex  to help with agitation during SBT/SAT, extubated  5/6 agitated remains on precedex  and HFNC   Interim History / Subjective:  Patient is still requiring high flow nasal cannula oxygen though requirement is improving He was agitated overnight, required max dose Precedex  He was started on phenobarb in the setting of polysubstance withdrawal Remained afebrile  Objective   Blood pressure 135/79, pulse 66, temperature 98.7 F (37.1 C), temperature source Axillary, resp. rate (!) 24, height 5\' 6"  (1.676 m), weight 81.5 kg, SpO2 97%.    FiO2 (%):  [40 %] 40 %   Intake/Output Summary (Last 24 hours) at 04/01/2024 0847 Last data filed at 04/01/2024 0700 Gross per 24 hour  Intake 877.46 ml  Output 4125 ml  Net -3247.54 ml   Filed Weights   03/29/24 0500 03/31/24 0500 04/01/24 0500  Weight: 92 kg 86.8 kg 81.5 kg    Examination: General: Acute on chronically ill-appearing male, lying on the bed HEENT: [place]/AT, eyes anicteric.  Dry mucus membranes Neuro: Alert, awake following commands, dysarthric due to dry mucous  membranes Chest: Coarse breath sounds, no wheezes or rhonchi Heart: Regular rate and rhythm, no murmurs or gallops Abdomen: Soft, nontender, nondistended, bowel sounds present Skin: Refer to nursing notes   Labs and images reviewed  Patient Lines/Drains/Airways Status     Active Line/Drains/Airways     Name Placement date Placement time Site Days   Peripheral IV 03/31/24 20 G Anterior;Right Forearm 03/31/24  1030  Forearm  1   Peripheral IV 03/31/24 20 G 1" Anterior;Right;Upper Arm 03/31/24   1030  Arm  1   External Urinary Catheter 03/31/24  1600  --  1   Fecal Management System 35 mL 03/31/24  1600  -- 1         Resolved Hospital Problem list   Shock  AKI  Lactic acidosis Hypophosphatemia Exaggerated vagal tone with sinus bradycardia/ frequent pauses especially with suction or changing position Acute urinary retention Hypomagnesemia   Assessment & Plan:  Acute toxic encephalopathy in the setting of amphetamine and cocaine abuse Possible focal seizures with Todd's paralysis, improved 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, opiate induced Polysubstance abuse Alcohol  abuse with alcohol  withdrawal seizures Acute respiratory failure with hypoxia and hypercapnia Aspiration pneumonia, completed treatment Demand cardiac ischemia Chronic HFpEF Opiate dependence Anxiety disorder Obesity  Mental status slowly improving No more active seizures Continue Keppra  Counseling provided regarding substance quit Continue phenobarbital withdrawal protocol Titrate high flow nasal cannula oxygen Currently on 20 L and 40% Completed treatment with Unasyn  Monitor intake and output Hold diuretics Continue Xanax  and clonidine Diet and exercise counseling provided Discontinue Urecholine  Foley catheter was removed  Best Practice (right click and "Reselect all SmartList Selections" daily)   Diet/type: Continue sips and chips with medications DVT prophylaxis prophylactic heparin   Pressure ulcer(s): N/A GI prophylaxis: N/A Lines: N/A Foley:  N/A Code Status:  full code Last date of multidisciplinary goals of care discussion [4/30 updated son Ricky Werner over phone], updated son via phone 5/5  Labs   CBC: Recent Labs  Lab 03/25/24 1655 03/25/24 1657 03/26/24 0507 03/27/24 0726 03/28/24 1055 03/29/24 1122 03/30/24 0647 03/30/24 1046 03/31/24 0620 04/01/24 0506  WBC 15.0*  --  12.5* 10.3  --   --  8.7  --  11.2* 13.5*  NEUTROABS 10.4*  --   --   --   --   --  4.4  --   --   --    HGB 12.8*   < > 10.7* 10.6*   < > 9.5* 10.3* 8.8* 11.0* 11.1*  HCT 40.4   < > 32.2* 32.1*   < > 28.0* 31.8* 26.0* 32.6* 33.1*  MCV 98.1  --  95.5 95.3  --   --  97.2  --  93.1 92.2  PLT 275  --  209 175  --   --  221  --  246 282   < > = values in this interval not displayed.    Basic Metabolic Panel: Recent Labs  Lab 03/26/24 0507 03/26/24 1441 03/27/24 0726 03/28/24 0739 03/28/24 1055 03/29/24 1122 03/30/24 0647 03/30/24 1046 03/31/24 0620 03/31/24 1902 04/01/24 0506  NA 142  --  136  --    < > 140 142 139 139 137  --   K 3.9  --  4.3  --    < > 3.9 3.6 3.5 4.2 3.8  --   CL 107  --  107  --   --   --  107  --  104 101  --   CO2 26  --  25  --   --   --  26  --  23 23  --   GLUCOSE 84  --  133*  --   --   --  111*  --  97 101*  --   BUN 21*  --  19  --   --   --  13  --  12 14  --   CREATININE 1.25*  --  0.90  --   --   --  0.73  --  0.77 0.93  --   CALCIUM  7.6*  --  7.9*  --   --   --  8.7*  --  8.7* 8.9  --   MG 1.2*  --  2.2 2.0  --   --  1.7  --  1.8  --  2.0  PHOS  --  2.3* 1.9* 2.5  --   --  5.5*  --  3.6  --   --    < > = values in this interval not displayed.   GFR: Estimated Creatinine Clearance: 84.7 mL/min (by C-G formula based on SCr of 0.93 mg/dL). Recent Labs  Lab 03/26/24 0507 03/26/24 0844 03/27/24 0726 03/27/24 1238 03/27/24 2059 03/30/24 0647 03/31/24 0620 04/01/24 0506  WBC 12.5*  --  10.3  --   --  8.7 11.2* 13.5*  LATICACIDVEN 2.5* 4.8*  --  1.3 0.9  --   --   --     Liver Function Tests: Recent Labs  Lab 03/25/24 1655 03/26/24 0507 03/27/24 0726  AST 64* 48* 33  ALT 83* 59* 48*  ALKPHOS 39 24* 23*  BILITOT 0.7 1.0 1.1  PROT 6.9 4.7* 5.0*  ALBUMIN 3.9 2.5* 2.5*   No results for input(s): "LIPASE", "AMYLASE" in the last 168 hours. Recent Labs  Lab 03/25/24 2327  AMMONIA 34    ABG    Component Value Date/Time   PHART 7.395 03/30/2024 1046   PCO2ART 45.7 03/30/2024 1046   PO2ART 60 (L) 03/30/2024 1046   HCO3 28.0  03/30/2024 1046   TCO2 29 03/30/2024 1046   ACIDBASEDEF 3.2 (H) 03/25/2024 2330   O2SAT 90 03/30/2024 1046     Coagulation Profile: Recent Labs  Lab 03/25/24 1655  INR 1.2    Cardiac Enzymes: Recent Labs  Lab 03/25/24 1929 03/27/24 0726  CKTOTAL 401* 543*    HbA1C: Hgb A1c MFr Bld  Date/Time Value Ref Range Status  03/25/2024 07:29 PM 5.4 4.8 - 5.6 % Final    Comment:    (NOTE) Pre diabetes:          5.7%-6.4%  Diabetes:              >6.4%  Glycemic control for   <7.0% adults with diabetes     CBG: Recent Labs  Lab 03/31/24 1525 03/31/24 1952 03/31/24 2354 04/01/24 0355 04/01/24 0722  GLUCAP 99 107* 98 97 90      Trevor Fudge, MD [facility] See Amion for pager If no response to pager, please call [phone] until 7pm After 7pm, Please call E-link [phone]

## 2024-04-01 NOTE — Procedures (Addendum)
 Cortrak  Person Inserting Tube:  Ranette Luckadoo, Kerstin Peeling, RD Tube Type:  Cortrak - 43 inches Tube Size:  10 Tube Location:  Left nare Initial Placement:  Stomach Secured by: Bridle Technique Used to Measure Tube Placement:  Marking at nare/corner of mouth Cortrak Secured At:  68 cm   Cortrak Tube Team Note:  Consult received to place a Cortrak feeding tube.   No x-ray is required. RN may begin using tube.   If the tube becomes dislodged please keep the tube and contact the Cortrak team at [email or web address] for replacement.  If after hours and replacement cannot be delayed, place a NG tube and confirm placement with an abdominal x-ray.    Frederik Jansky, RD Registered Dietitian  See Amion for more information

## 2024-04-02 DIAGNOSIS — F10139 Alcohol abuse with withdrawal, unspecified: Secondary | ICD-10-CM | POA: Diagnosis not present

## 2024-04-02 DIAGNOSIS — J9601 Acute respiratory failure with hypoxia: Secondary | ICD-10-CM | POA: Diagnosis not present

## 2024-04-02 DIAGNOSIS — F1410 Cocaine abuse, uncomplicated: Secondary | ICD-10-CM | POA: Diagnosis not present

## 2024-04-02 DIAGNOSIS — G928 Other toxic encephalopathy: Secondary | ICD-10-CM | POA: Diagnosis not present

## 2024-04-02 DIAGNOSIS — G8384 Todd's paralysis (postepileptic): Secondary | ICD-10-CM | POA: Diagnosis not present

## 2024-04-02 DIAGNOSIS — N179 Acute kidney failure, unspecified: Secondary | ICD-10-CM | POA: Diagnosis not present

## 2024-04-02 DIAGNOSIS — R569 Unspecified convulsions: Secondary | ICD-10-CM | POA: Diagnosis not present

## 2024-04-02 LAB — BASIC METABOLIC PANEL WITH GFR
Anion gap: 11 (ref 5–15)
BUN: 17 mg/dL (ref 6–20)
CO2: 24 mmol/L (ref 22–32)
Calcium: 8.8 mg/dL — ABNORMAL LOW (ref 8.9–10.3)
Chloride: 105 mmol/L (ref 98–111)
Creatinine, Ser: 0.67 mg/dL (ref 0.61–1.24)
GFR, Estimated: >60 mL/min (ref >60)
Glucose, Bld: 127 mg/dL — ABNORMAL HIGH (ref 70–99)
Potassium: 3.6 mmol/L (ref 3.5–5.1)
Sodium: 140 mmol/L (ref 135–145)

## 2024-04-02 LAB — CBC
HCT: 34.9 % — ABNORMAL LOW (ref 39.0–52.0)
Hemoglobin: 11.9 g/dL — ABNORMAL LOW (ref 13.0–17.0)
MCH: 31.6 pg (ref 26.0–34.0)
MCHC: 34.1 g/dL (ref 30.0–36.0)
MCV: 92.6 fL (ref 80.0–100.0)
Platelets: 333 10*3/uL (ref 150–400)
RBC: 3.77 MIL/uL — ABNORMAL LOW (ref 4.22–5.81)
RDW: 13.8 % (ref 11.5–15.5)
WBC: 11.9 10*3/uL — ABNORMAL HIGH (ref 4.0–10.5)
nRBC: 0 % (ref 0.0–0.2)

## 2024-04-02 LAB — GLUCOSE, CAPILLARY
Glucose-Capillary: 113 mg/dL — ABNORMAL HIGH (ref 70–99)
Glucose-Capillary: 126 mg/dL — ABNORMAL HIGH (ref 70–99)
Glucose-Capillary: 127 mg/dL — ABNORMAL HIGH (ref 70–99)
Glucose-Capillary: 128 mg/dL — ABNORMAL HIGH (ref 70–99)
Glucose-Capillary: 131 mg/dL — ABNORMAL HIGH (ref 70–99)
Glucose-Capillary: 134 mg/dL — ABNORMAL HIGH (ref 70–99)

## 2024-04-02 NOTE — Progress Notes (Signed)
 Physical Therapy Treatment Patient Details Name: Ricky Werner MRN: [id number] DOB: 1966/10/06 Today's Date: 04/02/2024   History of Present Illness The pt is a 58 yo male presenting 4/30 after being found unresponsive. Work up revealed L-sided weakness, CT head negative. UDS + for cocaine, benzo, amphetamines. Pt admitted for management of seizure with post-ictal Todd's paralysis and toxic metabolic encephalopathy in the setting of hyperglycemia and cocaine abuse. ETT 4/30-5/5. PMH includes: alcohol  abuse and benzodiazepine dependence, cigarette smoking, HTN, alcohol  withdrawal seizures as well as concern for unprovoked seizures on Keppra     PT Comments  The pt was not agitated or combative today, but often needed redirecting as he remains impulsive. He is A&Ox4, but demonstrates deficits in STM, processing speed, and safety awareness. In addition, he appears to be potentially hallucinating. RN aware. He was able to progress to standing and ambulating in the room today with min-modA for balance. He is at high risk for falls with little awareness. Will continue to follow acutely.    If plan is discharge home, recommend the following: Two people to help with walking and/or transfers;Two people to help with bathing/dressing/bathroom;Assistance with cooking/housework;Direct supervision/assist for medications management;Direct supervision/assist for financial management;Assist for transportation;Help with stairs or ramp for entrance;Supervision due to cognitive status   Can travel by private vehicle     No  Equipment Recommendations  Rolling walker (2 wheels);BSC/3in1;Wheelchair cushion (measurements PT);Wheelchair (measurements PT) (TBD further)    Recommendations for Other Services       Precautions / Restrictions Precautions Precautions: Fall;Other (comment) (seizure) Recall of Precautions/Restrictions: Impaired Precaution/Restrictions Comments: posey belt, rectal pouch, cortrak, watch  HR Restrictions Weight Bearing Restrictions Per Provider Order: No     Mobility  Bed Mobility Overal bed mobility: Needs Assistance Bed Mobility: Supine to Sit, Sit to Supine     Supine to sit: HOB elevated, Used rails, Min assist Sit to supine: Mod assist, HOB elevated   General bed mobility comments: Pt needed extra time and cues to use rails to scoot and sit up R EOB, CGA for safety. ModA needed to lift legs and direct pt back to supine, cuing pt to lean to his R elbow to descend his trunk.    Transfers Overall transfer level: Needs assistance Equipment used: 1 person hand held assist Transfers: Sit to/from Stand Sit to Stand: Min assist           General transfer comment: Pt displayed a mild posterior lean and instability in his legs, needing minA to transfer to stand from EOB multiple reps    Ambulation/Gait Ambulation/Gait assistance: Min assist, Mod assist Gait Distance (Feet): 48 Feet (x2 bouts of ~10 ft > ~48 ft) Assistive device: 1 person hand held assist Gait Pattern/deviations: Step-through pattern, Decreased step length - right, Decreased step length - left, Decreased stride length, Staggering right, Staggering left Gait velocity: reduced Gait velocity interpretation: <1.31 ft/sec, indicative of household ambulator   General Gait Details: Noted bil knee instability and staggering bil, needing min-modA to regain and maintain balance when ambulating in room. Cues provided for wider stance   Stairs             Wheelchair Mobility     Tilt Bed    Modified Rankin (Stroke Patients Only)       Balance Overall balance assessment: Needs assistance Sitting-balance support: Feet supported, No upper extremity supported Sitting balance-Leahy Scale: Fair Sitting balance - Comments: sits EOB with CGA for safety   Standing balance support: No upper extremity  supported, During functional activity Standing balance-Leahy Scale: Poor Standing balance  comment: Needs min-modA to prevent LOB when ambulating                            Communication Communication Communication: Impaired Factors Affecting Communication: Reduced clarity of speech  Cognition Arousal: Alert Behavior During Therapy: Impulsive   PT - Cognitive impairments: Awareness, Attention, Memory, Sequencing, Problem solving, Safety/Judgement                       PT - Cognition Comments: Not agitated today but still impulsive. Pt often trying to carry his bag of belongings even after educated it would travel with him, seemingly forgetting these conversations. Technically A&O4, but demonstrated deficits in memory and awareness. he also appeared to be hallucinating a "gym" where the bathroom is. RN aware. Followed simple commands >80% of time with extra processing time Following commands: Impaired Following commands impaired: Follows one step commands inconsistently, Follows one step commands with increased time    Cueing Cueing Techniques: Tactile cues, Verbal cues  Exercises      General Comments General comments (skin integrity, edema, etc.): HR up to 120s      Pertinent Vitals/Pain Pain Assessment Pain Assessment: Faces Faces Pain Scale: No hurt Pain Intervention(s): Monitored during session    Home Living                          Prior Function            PT Goals (current goals can now be found in the care plan section) Acute Rehab PT Goals Patient Stated Goal: to go home PT Goal Formulation: With patient Time For Goal Achievement: 04/14/24 Potential to Achieve Goals: Fair Progress towards PT goals: Progressing toward goals    Frequency    Min 2X/week      PT Plan      Co-evaluation              AM-PAC PT "6 Clicks" Mobility   Outcome Measure  Help needed turning from your back to your side while in a flat bed without using bedrails?: A Little Help needed moving from lying on your back to sitting on  the side of a flat bed without using bedrails?: A Little Help needed moving to and from a bed to a chair (including a wheelchair)?: A Lot Help needed standing up from a chair using your arms (e.g., wheelchair or bedside chair)?: A Little Help needed to walk in hospital room?: A Lot Help needed climbing 3-5 steps with a railing? : Total 6 Click Score: 14    End of Session Equipment Utilized During Treatment: Gait belt Activity Tolerance: Patient tolerated treatment well Patient left: in bed;with call bell/phone within reach;with bed alarm set;with restraints reapplied Nurse Communication: Mobility status;Other (comment) (HR) PT Visit Diagnosis: Unsteadiness on feet (R26.81);Other abnormalities of gait and mobility (R26.89);Difficulty in walking, not elsewhere classified (R26.2)     Time: 9147-8295 PT Time Calculation (min) (ACUTE ONLY): 29 min  Charges:    $Gait Training: 8-22 mins $Therapeutic Activity: 8-22 mins PT General Charges $$ ACUTE PT VISIT: 1 Visit                     Vernida Goodie, PT, DPT Acute Rehabilitation Services  Office: [phone]    Ellyn Hack 04/02/2024, 3:24 PM

## 2024-04-02 NOTE — Progress Notes (Signed)
 NAME:  Ricky Werner, MRN:  [id number], DOB:  Aug 07, 1966, LOS: 8 ADMISSION DATE:  03/25/2024, CONSULTATION DATE:  4/30 REFERRING MD:  Dr. Kermit Ped , CHIEF COMPLAINT:  unresponsive; possible seizure; acute respiratory failure   History of Present Illness:  Patient is a 58 year old male with pertinent PMH alcohol  use with history of alcohol  withdrawal and withdrawal seizure, tobacco/marijuana abuse, HTN, anxiety/depression presents to [facility] ED on 4/30 unresponsive.  Patient has history of alcohol  withdrawal seizures last admitted on 2018.  On Keppra .  Patient lives with son.  Family states he has not been drinking regularly since he has been out of work.  On 4/30 patient LKN an hour and a half ago and son found patient on floor unresponsive.  Patient started CPR.  EMS arrived and patient unresponsive but had a pulse.  Patient given Narcan with no effect.  Patient noted to be bradycardic and placed on supplemental O2.  Transported to [facility] ED.  On arrival BP soft.  GCS 7.  Localizes to pain on right side.  On NRB with sats mid 80s requiring intubation.  Patient required levo for hypotension.  CT head no acute findings.  Neuro consulted recommending MRI.  Gave Keppra  load.  Started on thiamine . Ethanol wnl.  UDS and Keppra  level pending.  CT chest showing peribronchovascular infiltrates versus atelectasis in L>R favoring aspiration. Wbc 15. Cultures obtained and started on rocephin /azithro. Glucose 459, co2 15, AG 21. UA, Beta H, Osm pending. Given insulin .   Pertinent ED Labs: Creat 1.96, AST 64, ALT 83,  Pertinent  Medical History   Past Medical History:  Diagnosis Date   Alcohol  abuse    Anxiety    BPH (benign prostatic hyperplasia)    Carpal tunnel syndrome on right    Chronic back pain    Depression    History of dental problems    Hypertension    Osteoarthritis    Panic attacks    Right tennis elbow    Sciatica    Seizures (HCC) 09/30/2020   Vertigo      Significant Hospital  Events: Including procedures, antibiotic start and stop dates in addition to other pertinent events   4/30 admitted w/ acute encephalopathy intubated 5/1 weaning sedation. Decr FiO2 to 60%. MRI/A 5/2 near arrest after being repositioned. Brady w pauses, hypoxemic, biting tube.  5/3 continue to have thick secretions via ET tube, gets bradycardic with cough and suction 5/5 trial precedex  to help with agitation during SBT/SAT, extubated  5/6 agitated remains on precedex  and HFNC  5/7 high flow nasal cannula was titrated off, intermittently agitated but better with phenobarbital  Interim History / Subjective:  Patient's oxygen requirement has significantly improved He is off heated high flow and currently on room air Agitations have improved after starting phenobarbital Did not require Precedex  for 24 hours Afebrile.  Objective   Blood pressure (!) 134/91, pulse (!) 120, temperature 99.1 F (37.3 C), temperature source Axillary, resp. rate (!) 29, height 5\' 6"  (1.676 m), weight 79.2 kg, SpO2 95%.        Intake/Output Summary (Last 24 hours) at 04/02/2024 0901 Last data filed at 04/02/2024 0800 Gross per 24 hour  Intake 999.17 ml  Output 2275 ml  Net -1275.83 ml   Filed Weights   03/31/24 0500 04/01/24 0500 04/02/24 0448  Weight: 86.8 kg 81.5 kg 79.2 kg    Examination: General: Acute on chronically ill-appearing male, lying on the bed HEENT: [place]/AT, eyes anicteric.  moist mucus membranes Neuro: Alert,  awake following commands, status post seatbelt on Chest: Coarse breath sounds, no wheezes or rhonchi Heart: Regular rate and rhythm, no murmurs or gallops Abdomen: Soft, nontender, nondistended, bowel sounds presen   Labs and images reviewed  Patient Lines/Drains/Airways Status     Active Line/Drains/Airways     Name Placement date Placement time Site Days   Peripheral IV 03/31/24 20 G Anterior;Right Forearm 03/31/24  1030  Forearm  2   Peripheral IV 03/31/24 20 G 1"  Anterior;Right;Upper Arm 03/31/24  1030  Arm  2   External Urinary Catheter 03/31/24  1600  --  2   Fecal Management System 35 mL 03/31/24  1600  -- 2   Small Bore Feeding Tube 10 Fr. 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, opiate induced Polysubstance abuse Alcohol  abuse with alcohol  withdrawal seizures Acute respiratory failure with hypoxia and hypercapnia Aspiration pneumonia, completed treatment Demand cardiac ischemia Chronic HFpEF Opiate dependence Anxiety disorder Obesity  Mental status continue to improve No more active seizures Continue Keppra  Counseling provided regarding substance quit Continue phenobarbital withdrawal protocol High flow nasal cannula oxygen was titrated off, patient is on room air now Completed treatment with Unasyn  Monitor intake and output Continue Xanax , Seroquel and clonidine Diet and exercise counseling provided Monitor intake and output  Patient is ready to be transferred out of ICU  Best Practice (right click and "Reselect all SmartList Selections" daily)   Diet/type: Continue sips and chips with medications.  Tube feeds DVT prophylaxis prophylactic heparin   Pressure ulcer(s): N/A GI prophylaxis: N/A Lines: N/A Foley:  N/A Code Status:  full code Last date of multidisciplinary goals of care discussion [4/30 updated son Veryl Gottron over phone], updated son via phone 5/5  Labs   CBC: Recent Labs  Lab 03/27/24 0726 03/28/24 1055 03/30/24 0647 03/30/24 1046 03/31/24 0620  04/01/24 0506 04/02/24 0613  WBC 10.3  --  8.7  --  11.2* 13.5* 11.9*  NEUTROABS  --   --  4.4  --   --   --   --   HGB 10.6*   < > 10.3* 8.8* 11.0* 11.1* 11.9*  HCT 32.1*   < > 31.8* 26.0* 32.6* 33.1* 34.9*  MCV 95.3  --  97.2  --  93.1 92.2 92.6  PLT 175  --  221  --  246 282 333   < > = values in this interval not displayed.    Basic Metabolic Panel: Recent Labs  Lab 03/26/24 1441 03/27/24 0726 03/28/24 0739 03/28/24 1055 03/30/24 0647 03/30/24 1046 03/31/24 0620 03/31/24 1902 04/01/24 0506 04/02/24 0613  NA  --  136  --    < > 142 139 139 137  --  140  K  --  4.3  --    < > 3.6 3.5 4.2 3.8  --  3.6  CL  --  107  --   --  107  --  104 101  --  105  CO2  --  25  --   --  26  --  23 23  --  24  GLUCOSE  --  133*  --   --  111*  --  97 101*  --  127*  BUN  --  19  --   --  13  --  12 14  --  17  CREATININE  --  0.90  --   --  0.73  --  0.77 0.93  --  0.67  CALCIUM   --  7.9*  --   --  8.7*  --  8.7* 8.9  --  8.8*  MG  --  2.2 2.0  --  1.7  --  1.8  --  2.0  --   PHOS 2.3* 1.9* 2.5  --  5.5*  --  3.6  --   --   --    < > = values in this interval not displayed.   GFR: Estimated Creatinine Clearance: 97.2 mL/min (by C-G formula based on SCr of 0.67 mg/dL). Recent Labs  Lab 03/27/24 1238 03/27/24 2059 03/30/24 0647 03/31/24 0620 04/01/24 0506 04/02/24 0613  WBC  --   --  8.7 11.2* 13.5* 11.9*  LATICACIDVEN 1.3 0.9  --   --   --   --     Liver Function Tests: Recent Labs  Lab 03/27/24 0726  AST 33  ALT 48*  ALKPHOS 23*  BILITOT 1.1  PROT 5.0*  ALBUMIN 2.5*   No results for input(s): "LIPASE", "AMYLASE" in the last 168 hours. No results for input(s): "AMMONIA" in the last 168 hours.   ABG    Component Value Date/Time   PHART 7.395 03/30/2024 1046   PCO2ART 45.7 03/30/2024 1046   PO2ART 60 (L) 03/30/2024 1046   HCO3 28.0 03/30/2024 1046   TCO2 29 03/30/2024 1046   ACIDBASEDEF 3.2 (H) 03/25/2024 2330   O2SAT 90 03/30/2024 1046     Coagulation  Profile: No results for input(s): "INR", "PROTIME" in the last 168 hours.   Cardiac Enzymes: Recent Labs  Lab 03/27/24 0726  CKTOTAL 543*    HbA1C: Hgb A1c MFr Bld  Date/Time Value Ref Range Status  03/25/2024 07:29 PM 5.4 4.8 - 5.6 % Final    Comment:    (NOTE) Pre diabetes:          5.7%-6.4%  Diabetes:              >6.4%  Glycemic control for   <7.0% adults with diabetes     CBG: Recent Labs  Lab 04/01/24 1520 04/01/24 1956 04/01/24 2325 04/02/24 0343 04/02/24 0756  GLUCAP 116* 116* 135* 126* 113*      Trevor Fudge, MD [facility] See Amion for pager If no response to pager, please call [phone] until 7pm After 7pm, Please call E-link [phone]

## 2024-04-02 NOTE — Plan of Care (Signed)
  Problem: Education: Goal: Knowledge of General Education information will improve Description: Including pain rating scale, medication(s)/side effects and non-pharmacologic comfort measures Outcome: Not Progressing   Problem: Clinical Measurements: Goal: Respiratory complications will improve Outcome: Progressing Goal: Cardiovascular complication will be avoided Outcome: Progressing   Problem: Clinical Measurements: Goal: Cardiovascular complication will be avoided Outcome: Progressing   Problem: Nutrition: Goal: Adequate nutrition will be maintained Outcome: Progressing   Problem: Coping: Goal: Level of anxiety will decrease Outcome: Progressing   Problem: Pain Managment: Goal: General experience of comfort will improve and/or be controlled Outcome: Progressing   Problem: Role Relationship: Goal: Method of communication will improve Outcome: Progressing

## 2024-04-02 NOTE — Progress Notes (Signed)
 NEUROLOGY CONSULT FOLLOW UP NOTE   Date of service: Apr 02, 2024 Patient Name: Ricky Werner MRN:  [id number] DOB:  1966-04-30  Interval Hx/subjective   No family at the bedside. Patient is laying in the bed in NAD. He just worked with therapy in the room. He is awake and alert. No new neurological events overnight   Vitals   Vitals:   04/02/24 0700 04/02/24 0800 04/02/24 0900 04/02/24 1000  BP: (!) 151/79 (!) 134/91 (!) 145/52 (!) 135/90  Pulse: (!) 106 (!) 120 (!) 108 (!) 113  Resp: (!) 25 (!) 29 (!) 33 18  Temp:  99.1 F (37.3 C)    TempSrc:  Axillary    SpO2: 94% 95% 98% 94%  Weight:      Height:         Body mass index is 28.18 kg/m.  Physical Exam   Constitutional: Appears well-developed and well-nourished.  Psych: Affect appropriate to situation.  Eyes: No scleral injection.  HENT: No OP obstrucion.  Head: Normocephalic.  Cardiovascular: Normal rate and regular rhythm.  Respiratory: Effort normal, non-labored breathing.  GI: Soft.  No distension. There is no tenderness.  Skin: WDI.   Neurologic Examination    Mental status:awake Opens eyes spontaneously, can answer simple questions. follows commands, naming intact  CN:  Control and instrumentation engineer, PERRL Blinks to threat bilaterally Facial movement intact and symmetric Hearing intact to voice Cough and gag intact Tongue protrusion midline Motor/Sensory:  Spontaneous movement against gravity of all extremities Cerebellar: no overt ataxia seen.  Gait: Unable to assess  Medications  Current Facility-Administered Medications:    acetaminophen  (TYLENOL ) tablet 650 mg, 650 mg, Oral, Q6H PRN, Trevor Fudge, MD, 650 mg at 04/01/24 1358   ALPRAZolam  (XANAX ) tablet 1 mg, 1 mg, Per Tube, TID, Trevor Fudge, MD, 1 mg at 04/02/24 5784   Chlorhexidine  Gluconate Cloth 2 % PADS 6 each, 6 each, Topical, Q2200, [name], John D, PA-C, 6 each at 04/01/24 1600   cloNIDine (CATAPRES) tablet 0.1 mg, 0.1 mg, Per Tube, TID, Trevor Fudge, MD, 0.1 mg at 04/02/24 6962   cyanocobalamin  (VITAMIN B12) tablet 1,000 mcg, 1,000 mcg, Per Tube, Daily, Gleason, Patt Boozer, PA-C, 1,000 mcg at 04/02/24 9528   escitalopram  (LEXAPRO ) tablet 10 mg, 10 mg, Per Tube, QHS, Gleason, Laura R, PA-C, 10 mg at 04/01/24 2114   feeding supplement (OSMOLITE 1.5 CAL) liquid 1,000 mL, 1,000 mL, Per Tube, Continuous, Aleck Hurdle, MD, Last Rate: 50 mL/hr at 04/02/24 0824, 1,000 mL at 04/02/24 0824   feeding supplement (PROSource TF20) liquid 60 mL, 60 mL, Per Tube, BID, Desai, Nikita S, MD, 60 mL at 04/02/24 4132   folic acid  (FOLVITE ) tablet 1 mg, 1 mg, Per Tube, Daily, Gleason, Patt Boozer, PA-C, 1 mg at 04/02/24 4401   haloperidol lactate (HALDOL) injection 5 mg, 5 mg, Intravenous, Q6H PRN, Trevor Fudge, MD, 5 mg at 04/01/24 2114   heparin  injection 5,000 Units, 5,000 Units, Subcutaneous, Q8H, [name], John D, PA-C, 5,000 Units at 04/02/24 0272   insulin  aspart (novoLOG ) injection 0-9 Units, 0-9 Units, Subcutaneous, Q4H, [name], John D, PA-C, 1 Units at 04/02/24 5366   ipratropium-albuterol  (DUONEB) 0.5-2.5 (3) MG/3ML nebulizer solution 3 mL, 3 mL, Nebulization, Q4H PRN, Bowser, Grace E, NP, 3 mL at 03/30/24 0802   levETIRAcetam  (KEPPRA ) undiluted injection 1,000 mg, 1,000 mg, Intravenous, Q12H, Oralee Billow I, RPH, 1,000 mg at 04/02/24 0802   multivitamin with minerals tablet 1 tablet, 1 tablet, Per Tube, Daily, Dannie Duval  D, PA-C, 1 tablet at 04/02/24 0925   Oral care mouth rinse, 15 mL, Mouth Rinse, PRN, Trevor Fudge, MD   oxyCODONE  (Oxy IR/ROXICODONE ) immediate release tablet 5-10 mg, 5-10 mg, Per Tube, Q4H PRN, Gleason, Patt Boozer, PA-C, 10 mg at 04/02/24 0453   [COMPLETED] PHENObarbital (LUMINAL) injection 97.5 mg, 97.5 mg, Intravenous, Q8H, 97.5 mg at 04/02/24 0928 **FOLLOWED BY** PHENObarbital (LUMINAL) injection 65 mg, 65 mg, Intravenous, Q8H **FOLLOWED BY** [START ON 04/04/2024] PHENObarbital (LUMINAL) injection 32.5 mg, 32.5 mg, Intravenous, Q8H,  Bitonti, Michael T, RPH   polyethylene glycol (MIRALAX  / GLYCOLAX ) packet 17 g, 17 g, Per Tube, Daily PRN, Oralee Billow I, RPH   polyethylene glycol (MIRALAX  / GLYCOLAX ) packet 17 g, 17 g, Per Tube, Daily, Gleason, Patt Boozer, PA-C   QUEtiapine (SEROQUEL) tablet 100 mg, 100 mg, Per Tube, BID, Gleason, Patt Boozer, PA-C, 100 mg at 04/02/24 8119   senna (SENOKOT) tablet 17.2 mg, 2 tablet, Per Tube, QHS, Gleason, Patt Boozer, PA-C, 17.2 mg at 03/31/24 2133   sodium chloride  HYPERTONIC 3 % nebulizer solution 4 mL, 4 mL, Nebulization, Daily, Zaida Hertz, Alease Hunter, MD, 4 mL at 04/01/24 1478   thiamine  (VITAMIN B1) tablet 100 mg, 100 mg, Per Tube, Daily, Gleason, Patt Boozer, PA-C, 100 mg at 04/02/24 0925  Labs and Diagnostic Imaging   CBC:  Recent Labs  Lab 03/30/24 0647 03/30/24 1046 04/01/24 0506 04/02/24 0613  WBC 8.7   < > 13.5* 11.9*  NEUTROABS 4.4  --   --   --   HGB 10.3*   < > 11.1* 11.9*  HCT 31.8*   < > 33.1* 34.9*  MCV 97.2   < > 92.2 92.6  PLT 221   < > 282 333   < > = values in this interval not displayed.    Basic Metabolic Panel:  Lab Results  Component Value Date   NA 140 04/02/2024   K 3.6 04/02/2024   CO2 24 04/02/2024   GLUCOSE 127 (H) 04/02/2024   BUN 17 04/02/2024   CREATININE 0.67 04/02/2024   CALCIUM  8.8 (L) 04/02/2024   GFRNONAA >60 04/02/2024   GFRAA >60 08/16/2020   Lipid Panel: No results found for: "LDLCALC" HgbA1c:  Lab Results  Component Value Date   HGBA1C 5.4 03/25/2024   Urine Drug Screen:     Component Value Date/Time   LABOPIA NONE DETECTED 03/25/2024 2143   COCAINSCRNUR POSITIVE (A) 03/25/2024 2143   LABBENZ POSITIVE (A) 03/25/2024 2143   AMPHETMU POSITIVE (A) 03/25/2024 2143   THCU NONE DETECTED 03/25/2024 2143   LABBARB NONE DETECTED 03/25/2024 2143    Alcohol  Level     Component Value Date/Time   [facility] <15 03/25/2024 1655   INR  Lab Results  Component Value Date   INR 1.2 03/25/2024   APTT  Lab Results  Component Value Date   APTT 24  03/25/2024   AED levels:  Lab Results  Component Value Date   LEVETIRACETA 6.0 (L) 03/25/2024    Imaging: - MRI shows concern for opoid-overdose related changes (CHANTER). Not on opioids at home and UDS was negative for opiates (positive for cocaine, amphet and benzo) but our tox screen is not sensitive for fentanyl .  - MRA negative. - MRI C-spine: Normal cord signal   - UDS was positive for benzodiazepine, cocaine and amphetamine (on admission). He does have a Xanax  prescription, but no rx for Adderall. Suspect possible substance-related seizure at home versus toxidrome or a combination of both.    -  No seizures seen on multiple days of LTM.     Assessment   JESTER CRITTENDEN is a 58 y.o. male with past medical history significant for alcohol  abuse and benzodiazepine dependence, cigarette smoking, hypertension, alcohol  withdrawal seizures as well as concern for unprovoked seizures, on Keppra  outpatient.    Per ED report, after intubation he was agitated, moving all 4 extremities grossly equally. Per Teleneurology note, it was felt that his presentation was most likely due to a focal seizure with rapidly resolving post-ictal Todd's paralysis. He was loaded with Keppra  2000 mg at OSH prior to transfer to [facility], continued on Keppra  1,000 BID (home dose).   - Imaging: - MRI shows concern for opoid-overdose related changes (CHANTER). Not on opioids at home and UDS was negative for opiates (positive for cocaine, amphet and benzo) but our tox screen is not sensitive for fentanyl .  - MRA negative. - MRI C-spine: Normal cord signal   - UDS was positive for benzodiazepine, cocaine and amphetamine (on admission). He does have a Xanax  prescription, but no rx for Adderall. Suspect possible substance-related seizure at home versus toxidrome or a combination of both.    - No seizures seen on multiple days of LTM.    - Impressions:  - Acute presentation most likely secondary to focal seizure with  post-ictal Todd's paralysis. Seizures possibly due to alcohol  withdrawal as patient has had these in the past, and family informed teleneurology that he has not been drinking recently. Lowered seizure threshold secondary to hyperglycemia.  - Toxic metabolic encephalopathy in the setting of hyperglycemia and cocaine abuse as well as possible breakthrough seizure due to the same. - Possible missed medications secondary to baseline memory issues. - Other comorbidities: Concern for renal dysfunction (AKI vs. CKD, no recent labs) . Hypoxia secondary to pneumonia, likely aspiration.   Recommendations   - Continue pneumonia treatment and infectious workup, as infection can further lower seizure threshold. Avoid cefepime.  - Continue thiamine  and folic acid  supplementation - continue electrolyte derangement correction - Continue Keppra  1000 mg BID (home dose). IV while no PO access. Transition to PO when able. - Continue Xanax  2mg  TID (home dose) when able to take PO/Per Tube.               pehnobarb taper for benzo withdrawal taper while unable to take this medication.  - Continue Inpatient seizure precautions - Outpatient seizure precautions should be discussed with the patient when he is awake.  - Substance abuse education when appropriate - Will take time for encephalopathy to improve suspect combination of withdrawal, agitated delirium, post ictal and substance use. - Neurology will sign off. Please call with questions or concerns    ______________________________________________________________________   Signed, Laymond Priestly, NP [facility]   NEUROHOSPITALIST ADDENDUM Performed a face to face diagnostic evaluation.   I have reviewed the contents of history and physical exam as documented by PA/ARNP/Resident and agree with above documentation.  I have discussed and formulated the above plan as documented. Edits to the note have been made as needed.  Myrka Sylva,  MD [facility] [phone]   If 7pm to 7am, please call on call as listed on AMION.

## 2024-04-02 NOTE — TOC Initial Note (Signed)
 Transition of Care [facility]) - Initial/Assessment Note    Patient Details  Name: Ricky Werner MRN: [id number] Date of Birth: 1966/08/14  [facility]) CM/SW Contact:    Jannice Mends, LCSW Phone Number: 04/02/2024, 3:01 PM  Clinical Narrative:                 TOC continues to follow for medical stability.     Barriers to Discharge: Continued Medical Work up, English as a second language teacher   Patient Goals and CMS Choice            Expected Discharge Plan and Services In-house Referral: Clinical Social Work                                            Prior Living Arrangements/Services   Lives with:: Adult Children Patient language and need for interpreter reviewed:: Yes Do you feel safe going back to the place where you live?: Yes      Need for Family Participation in Patient Care: Yes (Comment) Care giver support system in place?: Yes (comment)   Criminal Activity/Legal Involvement Pertinent to Current Situation/Hospitalization: No - Comment as needed  Activities of Daily Living   ADL Screening (condition at time of admission) Independently performs ADLs?: No Does the patient have a NEW difficulty with bathing/dressing/toileting/self-feeding that is expected to last >3 days?: Yes (Initiates electronic notice to provider for possible OT consult) Does the patient have a NEW difficulty with getting in/out of bed, walking, or climbing stairs that is expected to last >3 days?: Yes (Initiates electronic notice to provider for possible PT consult) Does the patient have a NEW difficulty with communication that is expected to last >3 days?: No Is the patient deaf or have difficulty hearing?: No Does the patient have difficulty seeing, even when wearing glasses/contacts?: No Does the patient have difficulty concentrating, remembering, or making decisions?: Yes  Permission Sought/Granted Permission sought to share information with : Facility Social worker, Family Supports Permission granted to share information with : No        Permission granted to share info w Relationship: Son     Emotional Assessment Appearance:: Appears stated age Attitude/Demeanor/Rapport: Unable to Assess Affect (typically observed): Unable to Assess Orientation: : Oriented to Self, Oriented to Place Alcohol  / Substance Use: Alcohol  Use Psych Involvement: No (comment)  Admission diagnosis:  Unresponsive [R41.89] Acute respiratory failure with hypoxia (HCC) [J96.01] AKI (acute kidney injury) (HCC) [N17.9] Multifocal pneumonia [J18.9] Acute encephalopathy [G93.40] Patient Active Problem List   Diagnosis Date Noted   Acute respiratory failure with hypoxia (HCC) 03/30/2024   AKI (acute kidney injury) (HCC) 03/30/2024   Multifocal pneumonia 03/30/2024   Unresponsive 03/25/2024   Acute encephalopathy 03/25/2024   Positive colorectal cancer screening using Cologuard test 03/11/2024   Rhinitis, chronic with refractory pnds and cough 12/13/2023   DOE (dyspnea on exertion) 07/25/2023   Cigarette smoker 07/25/2023   Alcohol  withdrawal seizure (HCC) 09/20/2017   Alcohol  abuse 09/20/2017   Chronic back pain 09/20/2017   Sciatica 09/20/2017   Essential hypertension 09/20/2017   Elevated liver enzymes 09/20/2017   Fatty liver 09/20/2017   Severe protein-calorie malnutrition (HCC) 09/20/2017   Elevated bilirubin 09/20/2017   Tobacco abuse 09/20/2017   Benzodiazepine dependence (HCC) 09/20/2017   PCP:  [name], MD Pharmacy:   [facility] - [place], [place] - [address] [phone]  [address] [place] [address] Phone: [phone] Fax: [phone]     Social Drivers of Health (SDOH) Social History: SDOH Screenings   Tobacco Use: High Risk (04/01/2024)   SDOH Interventions:     Readmission Risk Interventions     No data to display

## 2024-04-02 NOTE — Progress Notes (Signed)
 Received pt from 4N alert and oriented

## 2024-04-03 DIAGNOSIS — G9340 Encephalopathy, unspecified: Secondary | ICD-10-CM | POA: Diagnosis not present

## 2024-04-03 DIAGNOSIS — N179 Acute kidney failure, unspecified: Secondary | ICD-10-CM | POA: Diagnosis not present

## 2024-04-03 DIAGNOSIS — R4189 Other symptoms and signs involving cognitive functions and awareness: Secondary | ICD-10-CM | POA: Diagnosis not present

## 2024-04-03 DIAGNOSIS — J9601 Acute respiratory failure with hypoxia: Secondary | ICD-10-CM | POA: Diagnosis not present

## 2024-04-03 LAB — GLUCOSE, CAPILLARY
Glucose-Capillary: 104 mg/dL — ABNORMAL HIGH (ref 70–99)
Glucose-Capillary: 130 mg/dL — ABNORMAL HIGH (ref 70–99)
Glucose-Capillary: 104 mg/dL — ABNORMAL HIGH (ref 70–99)
Glucose-Capillary: 144 mg/dL — ABNORMAL HIGH (ref 70–99)
Glucose-Capillary: 134 mg/dL — ABNORMAL HIGH (ref 70–99)

## 2024-04-03 MED ORDER — CLONIDINE HCL 0.1 MG PO TABS
0.1000 mg | ORAL_TABLET | Freq: Three times a day (TID) | ORAL | Status: DC
  Administered 2024-04-03 – 2024-04-07 (×11): 0.1 mg via ORAL
  Filled 2024-04-03 (×11): qty 1

## 2024-04-03 MED ORDER — QUETIAPINE FUMARATE 100 MG PO TABS
100.0000 mg | ORAL_TABLET | Freq: Two times a day (BID) | ORAL | Status: DC
  Administered 2024-04-03 – 2024-04-06 (×7): 100 mg via ORAL
  Filled 2024-04-03 (×7): qty 1

## 2024-04-03 MED ORDER — FOLIC ACID 1 MG PO TABS
1.0000 mg | ORAL_TABLET | Freq: Every day | ORAL | Status: DC
  Administered 2024-04-04 – 2024-04-07 (×4): 1 mg via ORAL
  Filled 2024-04-03 (×4): qty 1

## 2024-04-03 MED ORDER — POLYETHYLENE GLYCOL 3350 17 G PO PACK
17.0000 g | PACK | Freq: Every day | ORAL | Status: DC | PRN
  Administered 2024-04-04: 17 g via ORAL

## 2024-04-03 MED ORDER — ALPRAZOLAM 0.5 MG PO TABS
1.0000 mg | ORAL_TABLET | Freq: Three times a day (TID) | ORAL | Status: DC
  Administered 2024-04-03 – 2024-04-07 (×11): 1 mg via ORAL
  Filled 2024-04-03 (×11): qty 2

## 2024-04-03 MED ORDER — THIAMINE MONONITRATE 100 MG PO TABS
100.0000 mg | ORAL_TABLET | Freq: Every day | ORAL | Status: DC
  Administered 2024-04-04 – 2024-04-07 (×4): 100 mg via ORAL
  Filled 2024-04-03 (×4): qty 1

## 2024-04-03 MED ORDER — ADULT MULTIVITAMIN W/MINERALS CH
1.0000 | ORAL_TABLET | Freq: Every day | ORAL | Status: DC
  Administered 2024-04-04 – 2024-04-07 (×4): 1 via ORAL
  Filled 2024-04-03 (×4): qty 1

## 2024-04-03 MED ORDER — VITAMIN B-12 1000 MCG PO TABS
1000.0000 ug | ORAL_TABLET | Freq: Every day | ORAL | Status: DC
  Administered 2024-04-04 – 2024-04-07 (×4): 1000 ug via ORAL
  Filled 2024-04-03 (×4): qty 1

## 2024-04-03 MED ORDER — ENSURE ENLIVE PO LIQD
237.0000 mL | Freq: Two times a day (BID) | ORAL | Status: DC
  Administered 2024-04-03 – 2024-04-04 (×3): 237 mL via ORAL

## 2024-04-03 MED ORDER — OXYCODONE HCL 5 MG PO TABS
5.0000 mg | ORAL_TABLET | ORAL | Status: DC | PRN
  Administered 2024-04-03: 5 mg via ORAL
  Filled 2024-04-03: qty 1

## 2024-04-03 MED ORDER — ESCITALOPRAM OXALATE 10 MG PO TABS
10.0000 mg | ORAL_TABLET | Freq: Every day | ORAL | Status: DC
  Administered 2024-04-03 – 2024-04-06 (×4): 10 mg via ORAL
  Filled 2024-04-03 (×4): qty 1

## 2024-04-03 MED ORDER — POLYETHYLENE GLYCOL 3350 17 G PO PACK
17.0000 g | PACK | Freq: Every day | ORAL | Status: DC
  Administered 2024-04-04: 17 g via ORAL
  Filled 2024-04-03 (×3): qty 1

## 2024-04-03 NOTE — Progress Notes (Signed)
 Occupational Therapy Treatment Patient Details Name: Ricky Werner MRN: [id number] DOB: 11/03/1966 Today's Date: 04/03/2024   History of present illness The pt is a 58 yo male presenting 4/30 after being found unresponsive. Work up revealed L-sided weakness, CT head negative. UDS + for cocaine, benzo, amphetamines. Pt admitted for management of seizure with post-ictal Todd's paralysis and toxic metabolic encephalopathy in the setting of hyperglycemia and cocaine abuse. ETT 4/30-5/5. PMH includes: alcohol  abuse and benzodiazepine dependence, cigarette smoking, HTN, alcohol  withdrawal seizures as well as concern for unprovoked seizures on Keppra    OT comments  Patient demonstrating less agitation but continues to have confusion. Patient making gains with bed mobility and transfers with patient meeting goal for toilet transfers. Patient required frequent cues for safety and sequencing during self care tasks. Patient has potential to make further progress with OT intervention. Patient will benefit from continued inpatient follow up therapy, <3 hours/day. Acute OT to continue to follow to address established goals to facilitate DC to next venue of care.       If plan is discharge home, recommend the following:  A little help with walking and/or transfers;A little help with bathing/dressing/bathroom   Equipment Recommendations  Other (comment) (defer)    Recommendations for Other Services      Precautions / Restrictions Precautions Precautions: Fall;Other (comment) (seizure) Recall of Precautions/Restrictions: Impaired Precaution/Restrictions Comments: posey belt, cortrak, watch HR Restrictions Weight Bearing Restrictions Per Provider Order: No       Mobility Bed Mobility Overal bed mobility: Needs Assistance Bed Mobility: Supine to Sit, Sit to Supine     Supine to sit: HOB elevated, Used rails, Min assist Sit to supine: Min assist, HOB elevated   General bed mobility comments:  increased time to get to EOB from supone and min assist for trunk. able to get to supine with assistance for BLEs and pulling up in bed    Transfers Overall transfer level: Needs assistance Equipment used: 1 person hand held assist Transfers: Sit to/from Stand Sit to Stand: Contact guard assist, Min assist           General transfer comment: min assist to stand from EOB and once up CGA for balance and safety     Balance Overall balance assessment: Needs assistance Sitting-balance support: Feet supported, No upper extremity supported Sitting balance-Leahy Scale: Fair Sitting balance - Comments: able to donn socks seated on EOB with CGA to supervision for balance   Standing balance support: Single extremity supported, No upper extremity supported, During functional activity Standing balance-Leahy Scale: Poor Standing balance comment: min to CGA to stand at sink for self care tasks for safety                           ADL either performed or assessed with clinical judgement   ADL Overall ADL's : Needs assistance/impaired     Grooming: Wash/dry hands;Wash/dry face;Oral care;Contact guard assist;Standing Grooming Details (indicate cue type and reason): at sink             Lower Body Dressing: Minimal assistance;Sitting/lateral leans Lower Body Dressing Details (indicate cue type and reason): to donn socks seated on EOB Toilet Transfer: Minimal assistance;Ambulation;Grab Paramedic Details (indicate cue type and reason): min assist for balance and safety Toileting- Clothing Manipulation and Hygiene: Supervision/safety;Sitting/lateral lean Toileting - Clothing Manipulation Details (indicate cue type and reason): able to perform toilet hygiene seated on Toilet       General  ADL Comments: less agitaion, poor safety with transfers and balance    Extremity/Trunk Assessment              Vision       Perception     Praxis      Communication Communication Communication: Impaired Factors Affecting Communication: Reduced clarity of speech   Cognition Arousal: Alert Behavior During Therapy: Impulsive Cognition: Cognition impaired, Difficult to assess   Orientation impairments: Time, Situation Awareness: Intellectual awareness impaired, Online awareness impaired   Attention impairment (select first level of impairment): Focused attention   OT - Cognition Comments: oriented to place and self, stated, "looks like all the snow melted"                 Following commands: Impaired Following commands impaired: Follows one step commands inconsistently, Follows one step commands with increased time      Cueing   Cueing Techniques: Tactile cues, Verbal cues  Exercises      Shoulder Instructions       General Comments      Pertinent Vitals/ Pain       Pain Assessment Pain Assessment: No/denies pain Pain Intervention(s): Monitored during session  Home Living                                          Prior Functioning/Environment              Frequency  Min 2X/week        Progress Toward Goals  OT Goals(current goals can now be found in the care plan section)  Progress towards OT goals: Progressing toward goals  Acute Rehab OT Goals Patient Stated Goal: none stated OT Goal Formulation: With patient Time For Goal Achievement: 04/14/24 Potential to Achieve Goals: Good ADL Goals Pt Will Perform Upper Body Bathing: with min assist Pt Will Perform Lower Body Bathing: with mod assist;with adaptive equipment;sit to/from stand Pt Will Perform Upper Body Dressing: with min assist;sitting Pt Will Perform Lower Body Dressing: with mod assist;sit to/from stand;with adaptive equipment Pt Will Transfer to Toilet: with min assist;stand pivot transfer;regular height toilet Additional ADL Goal #1: Pt will follow 2 step command 50% of session  Plan      Co-evaluation                  AM-PAC OT "6 Clicks" Daily Activity     Outcome Measure   Help from another person eating meals?: A Lot Help from another person taking care of personal grooming?: A Little Help from another person toileting, which includes using toliet, bedpan, or urinal?: A Little Help from another person bathing (including washing, rinsing, drying)?: A Lot Help from another person to put on and taking off regular upper body clothing?: A Lot Help from another person to put on and taking off regular lower body clothing?: A Lot 6 Click Score: 14    End of Session Equipment Utilized During Treatment: Gait belt  OT Visit Diagnosis: Unsteadiness on feet (R26.81);Muscle weakness (generalized) (M62.81)   Activity Tolerance Patient tolerated treatment well   Patient Left in bed;with call bell/phone within reach;with bed alarm set;with restraints reapplied   Nurse Communication Mobility status;Precautions        Time: 1610-9604 OT Time Calculation (min): 21 min  Charges: OT General Charges $OT Visit: 1 Visit OT Treatments $Self Care/Home Management : 8-22 mins  Anitra Barn,  [facility] Acute Rehabilitation Services  Office [phone]   Jovita Nipper 04/03/2024, 1:20 PM

## 2024-04-03 NOTE — Plan of Care (Signed)

## 2024-04-03 NOTE — Progress Notes (Signed)
 Nutrition Follow-up  DOCUMENTATION CODES:   Not applicable  INTERVENTION:  Continue MVI with minerals/thiamine /folic acid   Monitor tolerance/intake of advanced diet texture Add Ensure Enlive po BID, each supplement provides 350 kcal and 20 grams of protein.  Add Magic cup TID with meals, each supplement provides 290 kcal and 9 grams of protein  NUTRITION DIAGNOSIS:  Inadequate oral intake related to social / environmental circumstances (EtOH abuse) as evidenced by per patient/family report. - new dx established now that diet advanced  GOAL:  Patient will meet greater than or equal to 90% of their needs - progressing  MONITOR:  PO intake, Supplement acceptance, Skin, Labs  REASON FOR ASSESSMENT:  Consult Enteral/tube feeding initiation and management  ASSESSMENT:  Pt with PMH of alcohol  use with alcohol  withdrawal and withdrawal seizure, tobacco/marijuana abuse, HTN, anxiety/depression who lives with his son who reports decreased regular drinking now that pt out of a job admitted with acute encephalopathy due to polysubstance use.  4/30 - Admitted 5/01 - Tube Feeds initiated 5/05 - Extubated 5/06 - BSE - NPO 5/09 - pulled Cortrak, advanced to DYS3/Thins  Encephalopathy resolved. Some confusion remains.  Progressing with therapy. Therapy recommending SNF.  Average Meal Intake No documentation to review - diet advanced this afternoon   Patient pulled Cortrak today. Dysphagia 3 diet, thin liquids initiated. Unable to gradually wean off nutrition. Will assess intake as it is documented. Will order Ensure Enlive and Magic Cup to augment intake.   Admit Weight: 81.3kg Current Weight: 80.2kg  Wt stable during admission. 1.3L UOP yesterday. Bowel frequency picked up today. May benefit from Imodium if it does not resolve. Miralax  continues to be ordered.  Intake/Output Summary (Last 24 hours) at 04/03/2024 1927 Last data filed at 04/03/2024 9147 Gross per 24 hour  Intake 60 ml   Output 1200 ml  Net -1140 ml    Net IO Since Admission: 2,904.41 mL [04/03/24 1927]   WBC trending down. Blood sugars unremarkable. Vitamin supplementation continues r/t hx of EtOH use.  Meds: cyanocobalamin , folic acid , SSI 0-9 q4, MVI, Miralax , thiamine   Labs from 5/8 reviewed:  Na+ 140 (wdl) K+ 3.6 (wdl) PHOS 5.5>3.6 (wdl) Mg 2.0 (wdl) WBC 13.5>11.9 (H) CBGs 101-127 x24 hours  Diet Order:   Diet Order             DIET DYS 3 Room service appropriate? Yes with Assist; Fluid consistency: Thin  Diet effective now            EDUCATION NEEDS:  Education needs have been addressed  Skin:  Skin Assessment: Reviewed RN Assessment  Last BM:  5/9 - type 5-6 x3  Height:  Ht Readings from Last 1 Encounters:  03/25/24 5\' 6"  (1.676 m)   Weight:  Wt Readings from Last 1 Encounters:  04/03/24 80.2 kg    BMI:  Body mass index is 28.54 kg/m.  Estimated Nutritional Needs:   Kcal:  1900-2200  Protein:  105-120 grams  Fluid:  >2L/day  Con Decant MS, RD, LDN Registered Dietitian Clinical Nutrition RD Inpatient Contact Info in Amion

## 2024-04-03 NOTE — Progress Notes (Signed)
 Speech Language Pathology Treatment: Dysphagia  Patient Details Name: Ricky Werner MRN: [id number] DOB: [birth year] Today's Date: 04/03/2024 Time: 1011-1020 SLP Time Calculation (min) (ACUTE ONLY): 9 min  Assessment / Plan / Recommendation Clinical Impression  Pt presents with improvements related to mentation, although he remains generally confused but is more alert. He fed himself trials of thin liquids and solids without overt s/s of dysphagia or aspiration. Mastication remains mildly prolonged which may be secondary to missing dentition but he needed cueing to use a liquid wash. Will initiate a diet of Dys 3 solids with thin liquids. Pt may benefit from assistance at mealtimes. SLP will continue following but do not anticipate the need for post-acute dysphagia intervention.     HPI HPI: DARTH CRISMON is a 58 yo male presenting 4/30 after being found unresponsive. W/u revealed L sided weakness, CTH negative. CT Chest pending. UDS + for cocaine, benzo, amphetamines. Admitted for managements of seizure with post-ictal Todd's paralysis and toxic metabolic encephalopathy in the setting of hyperglycemia and cocaine abuse. ETT 4/30-5/5. PMH includes alcohol  abuse and benzodiazepine dependence, tobacco use, HTN, alcohol  withdrawal seizures as well as concern for unprovoked seizures on Keppra       SLP Plan  Continue with current plan of care      Recommendations for follow up therapy are one component of a multi-disciplinary discharge planning process, led by the attending physician.  Recommendations may be updated based on patient status, additional functional criteria and insurance authorization.    Recommendations  Diet recommendations: Thin liquid;Dysphagia 3 (mechanical soft) Liquids provided via: Cup;Straw Medication Administration: Whole meds with puree Supervision: Patient able to self feed Compensations: Minimize environmental distractions;Slow rate;Small sips/bites Postural Changes  and/or Swallow Maneuvers: Seated upright 90 degrees                  Oral care BID   Frequent or constant Supervision/Assistance Dysphagia, unspecified (R13.10)     Continue with current plan of care     Amil Kale, M.A., CCC-SLP Speech Language Pathology, Acute Rehabilitation Services  Secure Chat preferred [phone]   04/03/2024, 11:03 AM

## 2024-04-03 NOTE — Progress Notes (Signed)
 PROGRESS NOTE        PATIENT DETAILS Name: Ricky Werner Age: 58 y.o. Sex: male Date of Birth: Jan 31, 1966 Admit Date: 03/25/2024 Admitting Physician Claven Cumming, MD ZOX:[id number], Salena Craven, MD  Brief Summary: Patient is a 58 y.o.  male with history of EtOH use, seizure disorder, anxiety/depression-presented to [facility] with encephalopathy-he was essentially unresponsive-he was intubated and transferred to [facility] ICU.  He was evaluated by neurology-started on Keppra -underwent EEG/neuroimaging-he was thought to have toxic encephalopathy due to polysubstance abuse-focal seizures with Todd's paresis.  He was stabilized-subsequently extubated-and transferred to [facility].  See below for further details.  Significant events: 4/30>> admitted w/ acute encephalopathy intubated 5/2>> near arrest after being repositioned. Brady w pauses, hypoxemic, biting tube.  5/5>> extubated  5/6>> agitated remains on precedex  and HFNC  5/7>> high flow nasal cannula was titrated of HFNC 5/7>> Cortrak tube placed 5/9>> transferred to [facility].  Significant studies: 4/30>> CT head: No acute intracranial abnormality  4/30>> CT C-spine: No fracture/subluxation. 4/30>> CT chest/abdomen/pelvis: No traumatic injury-findings consistent with aspiration or bronchopneumonia. 5/01>> MRI brain:+ve symmetric restricted diffusion in the bilateral globus pallidus/deep portions of the cerebellum-differentials include toxic exposure/opioid neurotoxicity/anoxic injury. 5/01>> MRI brain: No stenosis 5/01>> echo: EF 55-60%, grade 1 diastolic dysfunction. 5/01-5/02>> LTM EEG: No seizures. 5/04>> MRI C-spine: No stenosis/compression  Significant microbiology data: 4/30>> COVID/influenza/RSV PCR: Negative 4/30>> blood culture 1/2:+ve ACTINOMYCES NAESLUNDII  5/01>> tracheal aspirate: No growth 5/05>> blood culture: No growth  Procedures: See above  Consults: Neurology PCCM  Subjective: Lying comfortably in  bed-denies any chest pain or shortness of breath.  Awake/alert-asking if NG tube can be removed at some point.  Objective: Vitals: Blood pressure (!) 153/101, pulse 94, temperature 97.9 F (36.6 C), temperature source Oral, resp. rate (!) 23, height 5\' 6"  (1.676 m), weight 80.2 kg, SpO2 95%.   Exam: Gen Exam:Alert awake-not in any distress HEENT:atraumatic, normocephalic Chest: B/L clear to auscultation anteriorly CVS:S1S2 regular Abdomen:soft non tender, non distended Extremities:no edema Neurology: Non focal-but with generalized weakness. Skin: no rash  Pertinent Labs/Radiology:    Latest Ref Rng & Units 04/02/2024    6:13 AM 04/01/2024    5:06 AM 03/31/2024    6:20 AM  CBC  WBC 4.0 - 10.5 K/uL 11.9  13.5  11.2   Hemoglobin 13.0 - 17.0 g/dL 54.0  98.1  19.1   Hematocrit 39.0 - 52.0 % 34.9  33.1  32.6   Platelets 150 - 400 K/uL 333  282  246     Lab Results  Component Value Date   NA 140 04/02/2024   K 3.6 04/02/2024   CL 105 04/02/2024   CO2 24 04/02/2024      Assessment/Plan: Acute hypoxic and hypercarbic respiratory failure-in the setting of severe toxic encephalopathy/aspiration pneumonia Intubated on initial presentation-extubated on 5/5-currently on room air Continue supportive care.  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 Encephalopathy has resolved with supportive care  Probable focal seizures likely related to alcohol  withdrawal/missed benzodiazepine dosage with postictal Todd's paresis Followed closely by neurology-neuroimaging suggestive of opioid overdose related changes LTM EEG negative No further seizures-he is now awake/alert Continue Keppra  Continue Xanax -Home dose 2 mg 3 times daily  EtOH abuse with suspicion for alcohol  withdrawal seizures Per prior notes-patient stopped drinking several days prior to this hospitalization Currently on phenobarb taper  Delirium-likely related to alcohol  withdrawal/ICU delirium Seroquel   twice daily for now-will slowly taper over the next several days. Mental status improved-awake/alert/cooperative.  Mood disorder Lexapro /benzos  Chronic benzodiazepine use Scheduled Xanax -slow outpatient taper per PCP if possible.  Aspiration pneumonia Actinomyces bacteremia Completed 7 days Unasyn  Repeat blood cultures negative  HTN BP stable-continue clonidine  Resume amlodipine  if BP starts increasing.  Exaggerated vagal tone with sinus bradycardia/frequent sinus pauses with suction or changing position Telemetry monitoring Echo stable EF.  Chronic HFpEF Euvolemic  Oropharyngeal dysphagia Likely due to encephalopathy/debility/deconditioning NG tube in place SLP following-await recommendations  Debility/deconditioning Secondary to critical illness PT/OT eval-SNF recommended  Polysubstance abuse EtOH/benzos/cocaine/amphetamine-suspicion for opiates although UDS negative (tox screen may not pick up fentanyl ) Continue to counsel  Nutrition Status: Nutrition Problem: Inadequate oral intake Etiology: inability to eat Signs/Symptoms: NPO status Interventions: Tube feeding, Prostat, MVI  Code status:   Code Status: Full Code   DVT Prophylaxis: heparin  injection 5,000 Units Start: 03/26/24 1400   Family Communication: None at bedside   Disposition Plan: Status is: Inpatient Remains inpatient appropriate because: Severity of illness   Planned Discharge Destination:Skilled nursing facility   Diet: Diet Order             Diet NPO time specified  Diet effective now                     Antimicrobial agents: Anti-infectives (From admission, onward)    Start     Dose/Rate Route Frequency Ordered Stop   03/25/24 2030  Ampicillin -Sulbactam (UNASYN ) 3 g in sodium chloride  0.9 % 100 mL IVPB        3 g 200 mL/hr over 30 Minutes Intravenous Every 6 hours 03/25/24 2024 03/31/24 1829   03/25/24 1830  cefTRIAXone  (ROCEPHIN ) 1 g in sodium chloride  0.9 % 100 mL  IVPB        1 g 200 mL/hr over 30 Minutes Intravenous  Once 03/25/24 1828 03/25/24 2010   03/25/24 1830  azithromycin  (ZITHROMAX ) 500 mg in sodium chloride  0.9 % 250 mL IVPB        500 mg 250 mL/hr over 60 Minutes Intravenous  Once 03/25/24 1828 03/25/24 2126        MEDICATIONS: Scheduled Meds:  ALPRAZolam   1 mg Per Tube TID   Chlorhexidine  Gluconate Cloth  6 each Topical Q2200   cloNIDine   0.1 mg Per Tube TID   vitamin B-12  1,000 mcg Per Tube Daily   escitalopram   10 mg Per Tube QHS   feeding supplement (PROSource TF20)  60 mL Per Tube BID   folic acid   1 mg Per Tube Daily   heparin   5,000 Units Subcutaneous Q8H   insulin  aspart  0-9 Units Subcutaneous Q4H   levETIRAcetam   1,000 mg Intravenous Q12H   multivitamin with minerals  1 tablet Per Tube Daily   PHENObarbital   65 mg Intravenous Q8H   Followed by   [START ON 04/04/2024] PHENObarbital   32.5 mg Intravenous Q8H   polyethylene glycol  17 g Per Tube Daily   QUEtiapine   100 mg Per Tube BID   thiamine   100 mg Per Tube Daily   Continuous Infusions:  feeding supplement (OSMOLITE 1.5 CAL) 1,000 mL (04/03/24 0532)   PRN Meds:.acetaminophen , haloperidol  lactate, ipratropium-albuterol , mouth rinse, oxyCODONE , polyethylene glycol   I have personally reviewed following labs and imaging studies  LABORATORY DATA: CBC: Recent Labs  Lab 03/30/24 0647 03/30/24 1046 03/31/24 0620 04/01/24 0506 04/02/24 0613  WBC 8.7  --  11.2* 13.5* 11.9*  NEUTROABS 4.4  --   --   --   --  HGB 10.3* 8.8* 11.0* 11.1* 11.9*  HCT 31.8* 26.0* 32.6* 33.1* 34.9*  MCV 97.2  --  93.1 92.2 92.6  PLT 221  --  246 282 333    Basic Metabolic Panel: Recent Labs  Lab 03/28/24 0739 03/28/24 1055 03/30/24 0647 03/30/24 1046 03/31/24 0620 03/31/24 1902 04/01/24 0506 04/02/24 0613  NA  --    < > 142 139 139 137  --  140  K  --    < > 3.6 3.5 4.2 3.8  --  3.6  CL  --   --  107  --  104 101  --  105  CO2  --   --  26  --  23 23  --  24  GLUCOSE   --   --  111*  --  97 101*  --  127*  BUN  --   --  13  --  12 14  --  17  CREATININE  --   --  0.73  --  0.77 0.93  --  0.67  CALCIUM   --   --  8.7*  --  8.7* 8.9  --  8.8*  MG 2.0  --  1.7  --  1.8  --  2.0  --   PHOS 2.5  --  5.5*  --  3.6  --   --   --    < > = values in this interval not displayed.    GFR: Estimated Creatinine Clearance: 97.8 mL/min (by C-G formula based on SCr of 0.67 mg/dL).  Liver Function Tests: No results for input(s): "AST", "ALT", "ALKPHOS", "BILITOT", "PROT", "ALBUMIN" in the last 168 hours. No results for input(s): "LIPASE", "AMYLASE" in the last 168 hours. No results for input(s): "AMMONIA" in the last 168 hours.  Coagulation Profile: No results for input(s): "INR", "PROTIME" in the last 168 hours.  Cardiac Enzymes: No results for input(s): "CKTOTAL", "CKMB", "CKMBINDEX", "TROPONINI" in the last 168 hours.  BNP (last 3 results) No results for input(s): "PROBNP" in the last 8760 hours.  Lipid Profile: Recent Labs    04/01/24 0506  TRIG 188*    Thyroid Function Tests: No results for input(s): "TSH", "T4TOTAL", "FREET4", "T3FREE", "THYROIDAB" in the last 72 hours.  Anemia Panel: No results for input(s): "VITAMINB12", "FOLATE", "FERRITIN", "TIBC", "IRON", "RETICCTPCT" in the last 72 hours.  Urine analysis:    Component Value Date/Time   COLORURINE YELLOW 03/25/2024 2143   APPEARANCEUR HAZY (A) 03/25/2024 2143   LABSPEC 1.018 03/25/2024 2143   PHURINE 5.0 03/25/2024 2143   GLUCOSEU >=500 (A) 03/25/2024 2143   HGBUR NEGATIVE 03/25/2024 2143   BILIRUBINUR NEGATIVE 03/25/2024 2143   KETONESUR NEGATIVE 03/25/2024 2143   PROTEINUR 30 (A) 03/25/2024 2143   NITRITE NEGATIVE 03/25/2024 2143   LEUKOCYTESUR NEGATIVE 03/25/2024 2143    Sepsis Labs: Lactic Acid, Venous    Component Value Date/Time   LATICACIDVEN 0.9 03/27/2024 2059    MICROBIOLOGY: Recent Results (from the past 240 hours)  Blood culture (routine x 2)     Status: Abnormal    Collection Time: 03/25/24  7:29 PM   Specimen: BLOOD  Result Value Ref Range Status   Specimen Description   Final    BLOOD BLOOD RIGHT ARM Performed at [facility], [address] [place], [place] [address]    Special Requests   Final    BOTTLES DRAWN AEROBIC AND ANAEROBIC Blood Culture results may not be optimal due to an inadequate volume of blood received in  culture bottles Performed at [facility], [address], [place], [place] [address]    Culture  Setup Time   Final    GRAM POSITIVE RODS ANAEROBIC BOTTLE ONLY CRITICAL RESULT CALLED TO, READ BACK BY AND VERIFIED WITH: JONES,K ON 03/28/24 AT 0154 BY PURDIE,J    Culture (A)  Final    ACTINOMYCES NAESLUNDII Standardized susceptibility testing for this organism is not available. Performed at [facility], [address] [place], [place] [address]    Report Status 03/30/2024 FINAL  Final  Blood culture (routine x 2)     Status: None   Collection Time: 03/25/24  7:29 PM   Specimen: BLOOD  Result Value Ref Range Status   Specimen Description BLOOD BLOOD LEFT ARM  Final   Special Requests   Final    BOTTLES DRAWN AEROBIC AND ANAEROBIC Blood Culture adequate volume   Culture   Final    NO GROWTH 5 DAYS Performed at [facility], [address], [place], [place] [address]    Report Status 03/30/2024 FINAL  Final  Resp panel by RT-PCR (RSV, Flu A&B, Covid) Urine, Catheterized     Status: None   Collection Time: 03/25/24  9:45 PM   Specimen: Urine, Catheterized; Nasal Swab  Result Value Ref Range Status   SARS Coronavirus 2 by RT PCR NEGATIVE NEGATIVE Final    Comment: (NOTE) SARS-CoV-2 target nucleic acids are NOT DETECTED.  The SARS-CoV-2 RNA is generally detectable in upper respiratory specimens during the acute phase of infection. The lowest concentration of SARS-CoV-2 viral copies this assay can detect is 138 copies/mL. A negative result does not preclude SARS-Cov-2 infection and should not be used as the sole  basis for treatment or other patient management decisions. A negative result may occur with  improper specimen collection/handling, submission of specimen other than nasopharyngeal swab, presence of viral mutation(s) within the areas targeted by this assay, and inadequate number of viral copies(<138 copies/mL). A negative result must be combined with clinical observations, patient history, and epidemiological information. The expected result is Negative.  Fact Sheet for Patients:  BloggerCourse.com  Fact Sheet for Healthcare Providers:  SeriousBroker.it  This test is no t yet approved or cleared by the [place]  FDA and  has been authorized for detection and/or diagnosis of SARS-CoV-2 by FDA under an Emergency Use Authorization (EUA). This EUA will remain  in effect (meaning this test can be used) for the duration of the COVID-19 declaration under Section 564(b)(1) of the Act, 21 U.S.C.section 360bbb-3(b)(1), unless the authorization is terminated  or revoked sooner.       Influenza A by PCR NEGATIVE NEGATIVE Final   Influenza B by PCR NEGATIVE NEGATIVE Final    Comment: (NOTE) The Xpert Xpress SARS-CoV-2/FLU/RSV plus assay is intended as an aid in the diagnosis of influenza from Nasopharyngeal swab specimens and should not be used as a sole basis for treatment. Nasal washings and aspirates are unacceptable for Xpert Xpress SARS-CoV-2/FLU/RSV testing.  Fact Sheet for Patients: BloggerCourse.com  Fact Sheet for Healthcare Providers: SeriousBroker.it  This test is not yet approved or cleared by the [place]  FDA and has been authorized for detection and/or diagnosis of SARS-CoV-2 by FDA under an Emergency Use Authorization (EUA). This EUA will remain in effect (meaning this test can be used) for the duration of the COVID-19 declaration under Section 564(b)(1) of the Act,  21 U.S.C. section 360bbb-3(b)(1), unless the authorization is terminated or revoked.     Resp Syncytial Virus  by PCR NEGATIVE NEGATIVE Final    Comment: (NOTE) Fact Sheet for Patients: BloggerCourse.com  Fact Sheet for Healthcare Providers: SeriousBroker.it  This test is not yet approved or cleared by the [place]  FDA and has been authorized for detection and/or diagnosis of SARS-CoV-2 by FDA under an Emergency Use Authorization (EUA). This EUA will remain in effect (meaning this test can be used) for the duration of the COVID-19 declaration under Section 564(b)(1) of the Act, 21 U.S.C. section 360bbb-3(b)(1), unless the authorization is terminated or revoked.  Performed at [facility], [address], [place], [place] [address]   MRSA Next Gen by PCR, Nasal     Status: None   Collection Time: 03/25/24 10:55 PM   Specimen: Nasal Mucosa; Nasal Swab  Result Value Ref Range Status   MRSA by PCR Next Gen NOT DETECTED NOT DETECTED Final    Comment: (NOTE) The GeneXpert MRSA Assay (FDA approved for NASAL specimens only), is one component of a comprehensive MRSA colonization surveillance program. It is not intended to diagnose MRSA infection nor to guide or monitor treatment for MRSA infections. Test performance is not FDA approved in patients less than 29 years old. Performed at [facility], [address] [place], [place] [address]   Culture, Respiratory w Gram Stain     Status: None   Collection Time: 03/26/24  4:06 AM   Specimen: Tracheal Aspirate; Respiratory  Result Value Ref Range Status   Specimen Description TRACHEAL ASPIRATE  Final   Special Requests NONE  Final   Gram Stain   Final    FEW WBC PRESENT, PREDOMINANTLY PMN MODERATE GRAM POSITIVE COCCI FEW GRAM NEGATIVE RODS    Culture   Final    FEW Normal respiratory flora-no Staph aureus or Pseudomonas seen Performed at [facility], [address], [place], [place] [address]    Report Status 03/28/2024 FINAL  Final  Culture, blood (Routine X 2) w Reflex to ID Panel     Status: None (Preliminary result)   Collection Time: 03/30/24  2:19 PM   Specimen: BLOOD  Result Value Ref Range Status   Specimen Description BLOOD SITE NOT SPECIFIED  Final   Special Requests   Final    BOTTLES DRAWN AEROBIC AND ANAEROBIC Blood Culture adequate volume   Culture   Final    NO GROWTH 4 DAYS Performed at [facility], [address] [place], [place] [address]    Report Status PENDING  Incomplete  Culture, blood (Routine X 2) w Reflex to ID Panel     Status: None (Preliminary result)   Collection Time: 03/30/24  2:26 PM   Specimen: BLOOD  Result Value Ref Range Status   Specimen Description BLOOD SITE NOT SPECIFIED  Final   Special Requests   Final    BOTTLES DRAWN AEROBIC AND ANAEROBIC Blood Culture adequate volume   Culture   Final    NO GROWTH 4 DAYS Performed at [facility], [address] [place], [place] [address]    Report Status PENDING  Incomplete    RADIOLOGY STUDIES/RESULTS: No results found.   LOS: 9 days   Kimberly Penna, MD  [facility]    To contact the attending provider between 7A-7P or the covering provider during after hours 7P-7A, please log into the web site [email or web address] and access using universal Amber password for that web site. If you do not have the password, please call the hospital operator.  04/03/2024,  9:27 AM

## 2024-04-04 DIAGNOSIS — J9601 Acute respiratory failure with hypoxia: Secondary | ICD-10-CM | POA: Diagnosis not present

## 2024-04-04 DIAGNOSIS — N179 Acute kidney failure, unspecified: Secondary | ICD-10-CM | POA: Diagnosis not present

## 2024-04-04 DIAGNOSIS — G9340 Encephalopathy, unspecified: Secondary | ICD-10-CM | POA: Diagnosis not present

## 2024-04-04 DIAGNOSIS — R4189 Other symptoms and signs involving cognitive functions and awareness: Secondary | ICD-10-CM | POA: Diagnosis not present

## 2024-04-04 LAB — CULTURE, BLOOD (ROUTINE X 2)
Culture: NO GROWTH
Culture: NO GROWTH
Special Requests: ADEQUATE
Special Requests: ADEQUATE

## 2024-04-04 LAB — GLUCOSE, CAPILLARY
Glucose-Capillary: 113 mg/dL — ABNORMAL HIGH (ref 70–99)
Glucose-Capillary: 135 mg/dL — ABNORMAL HIGH (ref 70–99)
Glucose-Capillary: 123 mg/dL — ABNORMAL HIGH (ref 70–99)

## 2024-04-04 MED ORDER — LEVETIRACETAM 500 MG PO TABS
1000.0000 mg | ORAL_TABLET | Freq: Two times a day (BID) | ORAL | Status: DC
  Administered 2024-04-04 – 2024-04-07 (×6): 1000 mg via ORAL
  Filled 2024-04-04: qty 4
  Filled 2024-04-04 (×5): qty 2

## 2024-04-04 NOTE — Progress Notes (Signed)
 PROGRESS NOTE        PATIENT DETAILS Name: Ricky Werner Age: 58 y.o. Sex: male Date of Birth: 04/19/1966 Admit Date: 03/25/2024 Admitting Physician Claven Cumming, MD ZOX:[id number], Salena Craven, MD  Brief Summary: Patient is a 58 y.o.  male with history of EtOH use, seizure disorder, anxiety/depression-presented to [facility] with encephalopathy-he was essentially unresponsive-he was intubated and transferred to [facility] ICU.  He was evaluated by neurology-started on Keppra -underwent EEG/neuroimaging-he was thought to have toxic encephalopathy due to polysubstance abuse-focal seizures with Todd's paresis.  He was stabilized-subsequently extubated-and transferred to [facility].  See below for further details.  Significant events: 4/30>> admitted w/ acute encephalopathy intubated 5/2>> near arrest after being repositioned. Brady w pauses, hypoxemic, biting tube.  5/5>> extubated  5/6>> agitated remains on precedex  and HFNC  5/7>> high flow nasal cannula was titrated of HFNC 5/7>> Cortrak tube placed 5/9>> transferred to [facility]-pulled out NG tube.  Significant studies: 4/30>> CT head: No acute intracranial abnormality  4/30>> CT C-spine: No fracture/subluxation. 4/30>> CT chest/abdomen/pelvis: No traumatic injury-findings consistent with aspiration or bronchopneumonia. 5/01>> MRI brain:+ve symmetric restricted diffusion in the bilateral globus pallidus/deep portions of the cerebellum-differentials include toxic exposure/opioid neurotoxicity/anoxic injury. 5/01>> MRI brain: No stenosis 5/01>> echo: EF 55-60%, grade 1 diastolic dysfunction. 5/01-5/02>> LTM EEG: No seizures. 5/04>> MRI C-spine: No stenosis/compression  Significant microbiology data: 4/30>> COVID/influenza/RSV PCR: Negative 4/30>> blood culture 1/2:+ve ACTINOMYCES NAESLUNDII  5/01>> tracheal aspirate: No growth 5/05>> blood culture: No growth  Procedures: See  above  Consults: Neurology PCCM  Subjective: Awake/alert-tolerating diet no major issues overnight.  This morning acknowledges that he did methamphetamine-but does not know how cocaine got into his system.  Objective: Vitals: Blood pressure 122/88, pulse 69, temperature 98.1 F (36.7 C), temperature source Oral, resp. rate 16, height 5\' 6"  (1.676 m), weight 87.2 kg, SpO2 100%.   Exam: Gen Exam:Alert awake-not in any distress HEENT:atraumatic, normocephalic Chest: B/L clear to auscultation anteriorly CVS:S1S2 regular Abdomen:soft non tender, non distended Extremities:no edema Neurology: Non focal-but with generalized weakness. Skin: no rash  Pertinent Labs/Radiology:    Latest Ref Rng & Units 04/02/2024    6:13 AM 04/01/2024    5:06 AM 03/31/2024    6:20 AM  CBC  WBC 4.0 - 10.5 K/uL 11.9  13.5  11.2   Hemoglobin 13.0 - 17.0 g/dL 54.0  98.1  19.1   Hematocrit 39.0 - 52.0 % 34.9  33.1  32.6   Platelets 150 - 400 K/uL 333  282  246     Lab Results  Component Value Date   NA 140 04/02/2024   K 3.6 04/02/2024   CL 105 04/02/2024   CO2 24 04/02/2024      Assessment/Plan: Acute hypoxic and hypercarbic respiratory failure-in the setting of severe toxic encephalopathy/aspiration pneumonia Intubated on initial presentation-extubated on 5/5-currently on room air Continue supportive care.  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 Encephalopathy has resolved with supportive care  Probable focal seizures likely related to alcohol  withdrawal/missed benzodiazepine dosage with postictal Todd's paresis Followed closely by neurology-neuroimaging suggestive of opioid overdose related changes LTM EEG negative No further seizures-he is now awake/alert Continue Keppra  Continue Xanax   EtOH abuse with suspicion for alcohol  withdrawal seizures Per prior notes-patient stopped drinking several days prior to this hospitalization Currently on phenobarb  taper  Delirium-likely related to alcohol  withdrawal/ICU delirium Seroquel  twice daily for  now-will slowly taper over the next several days. Mental status improved-awake/alert/cooperative.  Mood disorder Lexapro /benzos  Chronic benzodiazepine use Scheduled Xanax -slow outpatient taper per PCP if possible.  Aspiration pneumonia Actinomyces bacteremia Completed 7 days Unasyn  Repeat blood cultures negative  HTN BP stable-continue clonidine  Resume amlodipine  if BP starts increasing.  Exaggerated vagal tone with sinus bradycardia/frequent sinus pauses with suction or changing position Telemetry monitoring Echo stable EF.  Chronic HFpEF Euvolemic  Oropharyngeal dysphagia Likely due to encephalopathy/debility/deconditioning Patient pulled out NG tube on 5/9-seems to be tolerating diet. Appreciate SLP follow-up.  Debility/deconditioning Secondary to critical illness PT/OT eval-SNF recommended  Polysubstance abuse EtOH/benzos/cocaine/amphetamine-suspicion for opiates although UDS negative (tox screen may not pick up fentanyl ) Continue to counsel  Nutrition Status: Nutrition Problem: Inadequate oral intake Etiology: social / environmental circumstances (EtOH abuse) Signs/Symptoms: per patient/family report Interventions: Ensure Enlive (each supplement provides 350kcal and 20 grams of protein), Magic cup  Code status:   Code Status: Full Code   DVT Prophylaxis: heparin  injection 5,000 Units Start: 03/26/24 1400   Family Communication: None at bedside   Disposition Plan: Status is: Inpatient Remains inpatient appropriate because: Severity of illness   Planned Discharge Destination:Skilled nursing facility   Diet: Diet Order             DIET DYS 3 Room service appropriate? Yes with Assist; Fluid consistency: Thin  Diet effective now                     Antimicrobial agents: Anti-infectives (From admission, onward)    Start     Dose/Rate Route  Frequency Ordered Stop   03/25/24 2030  Ampicillin -Sulbactam (UNASYN ) 3 g in sodium chloride  0.9 % 100 mL IVPB        3 g 200 mL/hr over 30 Minutes Intravenous Every 6 hours 03/25/24 2024 03/31/24 1829   03/25/24 1830  cefTRIAXone  (ROCEPHIN ) 1 g in sodium chloride  0.9 % 100 mL IVPB        1 g 200 mL/hr over 30 Minutes Intravenous  Once 03/25/24 1828 03/25/24 2010   03/25/24 1830  azithromycin  (ZITHROMAX ) 500 mg in sodium chloride  0.9 % 250 mL IVPB        500 mg 250 mL/hr over 60 Minutes Intravenous  Once 03/25/24 1828 03/25/24 2126        MEDICATIONS: Scheduled Meds:  ALPRAZolam   1 mg Oral TID   Chlorhexidine  Gluconate Cloth  6 each Topical Q2200   cloNIDine   0.1 mg Oral TID   vitamin B-12  1,000 mcg Oral Daily   escitalopram   10 mg Oral QHS   feeding supplement  237 mL Oral BID BM   folic acid   1 mg Oral Daily   heparin   5,000 Units Subcutaneous Q8H   levETIRAcetam   1,000 mg Intravenous Q12H   multivitamin with minerals  1 tablet Oral Daily   PHENObarbital   32.5 mg Intravenous Q8H   polyethylene glycol  17 g Oral Daily   QUEtiapine   100 mg Oral BID   thiamine   100 mg Oral Daily   Continuous Infusions:   PRN Meds:.acetaminophen , haloperidol  lactate, ipratropium-albuterol , mouth rinse, oxyCODONE , polyethylene glycol   I have personally reviewed following labs and imaging studies  LABORATORY DATA: CBC: Recent Labs  Lab 03/30/24 0647 03/30/24 1046 03/31/24 0620 04/01/24 0506 04/02/24 0613  WBC 8.7  --  11.2* 13.5* 11.9*  NEUTROABS 4.4  --   --   --   --   HGB 10.3* 8.8* 11.0* 11.1* 11.9*  HCT  31.8* 26.0* 32.6* 33.1* 34.9*  MCV 97.2  --  93.1 92.2 92.6  PLT 221  --  246 282 333    Basic Metabolic Panel: Recent Labs  Lab 03/30/24 0647 03/30/24 1046 03/31/24 0620 03/31/24 1902 04/01/24 0506 04/02/24 0613  NA 142 139 139 137  --  140  K 3.6 3.5 4.2 3.8  --  3.6  CL 107  --  104 101  --  105  CO2 26  --  23 23  --  24  GLUCOSE 111*  --  97 101*  --  127*   BUN 13  --  12 14  --  17  CREATININE 0.73  --  0.77 0.93  --  0.67  CALCIUM  8.7*  --  8.7* 8.9  --  8.8*  MG 1.7  --  1.8  --  2.0  --   PHOS 5.5*  --  3.6  --   --   --     GFR: Estimated Creatinine Clearance: 101.7 mL/min (by C-G formula based on SCr of 0.67 mg/dL).  Liver Function Tests: No results for input(s): "AST", "ALT", "ALKPHOS", "BILITOT", "PROT", "ALBUMIN" in the last 168 hours. No results for input(s): "LIPASE", "AMYLASE" in the last 168 hours. No results for input(s): "AMMONIA" in the last 168 hours.  Coagulation Profile: No results for input(s): "INR", "PROTIME" in the last 168 hours.  Cardiac Enzymes: No results for input(s): "CKTOTAL", "CKMB", "CKMBINDEX", "TROPONINI" in the last 168 hours.  BNP (last 3 results) No results for input(s): "PROBNP" in the last 8760 hours.  Lipid Profile: No results for input(s): "CHOL", "HDL", "LDLCALC", "TRIG", "CHOLHDL", "LDLDIRECT" in the last 72 hours.   Thyroid Function Tests: No results for input(s): "TSH", "T4TOTAL", "FREET4", "T3FREE", "THYROIDAB" in the last 72 hours.  Anemia Panel: No results for input(s): "VITAMINB12", "FOLATE", "FERRITIN", "TIBC", "IRON", "RETICCTPCT" in the last 72 hours.  Urine analysis:    Component Value Date/Time   COLORURINE YELLOW 03/25/2024 2143   APPEARANCEUR HAZY (A) 03/25/2024 2143   LABSPEC 1.018 03/25/2024 2143   PHURINE 5.0 03/25/2024 2143   GLUCOSEU >=500 (A) 03/25/2024 2143   HGBUR NEGATIVE 03/25/2024 2143   BILIRUBINUR NEGATIVE 03/25/2024 2143   KETONESUR NEGATIVE 03/25/2024 2143   PROTEINUR 30 (A) 03/25/2024 2143   NITRITE NEGATIVE 03/25/2024 2143   LEUKOCYTESUR NEGATIVE 03/25/2024 2143    Sepsis Labs: Lactic Acid, Venous    Component Value Date/Time   LATICACIDVEN 0.9 03/27/2024 2059    MICROBIOLOGY: Recent Results (from the past 240 hours)  Blood culture (routine x 2)     Status: Abnormal   Collection Time: 03/25/24  7:29 PM   Specimen: BLOOD  Result Value  Ref Range Status   Specimen Description   Final    BLOOD BLOOD RIGHT ARM Performed at [facility], [address], [place], [place] [address]    Special Requests   Final    BOTTLES DRAWN AEROBIC AND ANAEROBIC Blood Culture results may not be optimal due to an inadequate volume of blood received in culture bottles Performed at [facility], [address], [place], [place] [address]    Culture  Setup Time   Final    GRAM POSITIVE RODS ANAEROBIC BOTTLE ONLY CRITICAL RESULT CALLED TO, READ BACK BY AND VERIFIED WITH: JONES,K ON 03/28/24 AT 0154 BY PURDIE,J    Culture (A)  Final    ACTINOMYCES NAESLUNDII Standardized susceptibility testing for this organism is not available. Performed at [facility], [address] [place], [place] [address]    Report Status 03/30/2024 FINAL  Final  Blood culture (routine x 2)     Status: None   Collection Time: 03/25/24  7:29 PM   Specimen: BLOOD  Result Value Ref Range Status   Specimen Description BLOOD BLOOD LEFT ARM  Final   Special Requests   Final    BOTTLES DRAWN AEROBIC AND ANAEROBIC Blood Culture adequate volume   Culture   Final    NO GROWTH 5 DAYS Performed at [facility], [address], [place], [place] [address]    Report Status 03/30/2024 FINAL  Final  Resp panel by RT-PCR (RSV, Flu A&B, Covid) Urine, Catheterized     Status: None   Collection Time: 03/25/24  9:45 PM   Specimen: Urine, Catheterized; Nasal Swab  Result Value Ref Range Status   SARS Coronavirus 2 by RT PCR NEGATIVE NEGATIVE Final    Comment: (NOTE) SARS-CoV-2 target nucleic acids are NOT DETECTED.  The SARS-CoV-2 RNA is generally detectable in upper respiratory specimens during the acute phase of infection. The lowest concentration of SARS-CoV-2 viral copies this assay can detect is 138 copies/mL. A negative result does not preclude SARS-Cov-2 infection and should not be used as the sole basis for treatment or other patient management decisions. A negative  result may occur with  improper specimen collection/handling, submission of specimen other than nasopharyngeal swab, presence of viral mutation(s) within the areas targeted by this assay, and inadequate number of viral copies(<138 copies/mL). A negative result must be combined with clinical observations, patient history, and epidemiological information. The expected result is Negative.  Fact Sheet for Patients:  BloggerCourse.com  Fact Sheet for Healthcare Providers:  SeriousBroker.it  This test is no t yet approved or cleared by the [place]  FDA and  has been authorized for detection and/or diagnosis of SARS-CoV-2 by FDA under an Emergency Use Authorization (EUA). This EUA will remain  in effect (meaning this test can be used) for the duration of the COVID-19 declaration under Section 564(b)(1) of the Act, 21 U.S.C.section 360bbb-3(b)(1), unless the authorization is terminated  or revoked sooner.       Influenza A by PCR NEGATIVE NEGATIVE Final   Influenza B by PCR NEGATIVE NEGATIVE Final    Comment: (NOTE) The Xpert Xpress SARS-CoV-2/FLU/RSV plus assay is intended as an aid in the diagnosis of influenza from Nasopharyngeal swab specimens and should not be used as a sole basis for treatment. Nasal washings and aspirates are unacceptable for Xpert Xpress SARS-CoV-2/FLU/RSV testing.  Fact Sheet for Patients: BloggerCourse.com  Fact Sheet for Healthcare Providers: SeriousBroker.it  This test is not yet approved or cleared by the [place]  FDA and has been authorized for detection and/or diagnosis of SARS-CoV-2 by FDA under an Emergency Use Authorization (EUA). This EUA will remain in effect (meaning this test can be used) for the duration of the COVID-19 declaration under Section 564(b)(1) of the Act, 21 U.S.C. section 360bbb-3(b)(1), unless the authorization is  terminated or revoked.     Resp Syncytial Virus by PCR NEGATIVE NEGATIVE Final    Comment: (NOTE) Fact Sheet for Patients: BloggerCourse.com  Fact Sheet for Healthcare Providers: SeriousBroker.it  This test is not yet approved or cleared by the [place]  FDA and has been authorized for detection and/or diagnosis of SARS-CoV-2 by FDA under an Emergency Use Authorization (EUA). This EUA will remain in effect (meaning this test can be used) for the duration of the COVID-19 declaration under Section 564(b)(1) of  the Act, 21 U.S.C. section 360bbb-3(b)(1), unless the authorization is terminated or revoked.  Performed at [facility], [address], [place], [place] [address]   MRSA Next Gen by PCR, Nasal     Status: None   Collection Time: 03/25/24 10:55 PM   Specimen: Nasal Mucosa; Nasal Swab  Result Value Ref Range Status   MRSA by PCR Next Gen NOT DETECTED NOT DETECTED Final    Comment: (NOTE) The GeneXpert MRSA Assay (FDA approved for NASAL specimens only), is one component of a comprehensive MRSA colonization surveillance program. It is not intended to diagnose MRSA infection nor to guide or monitor treatment for MRSA infections. Test performance is not FDA approved in patients less than 9 years old. Performed at [facility], [address], [place], [place] [address]   Culture, Respiratory w Gram Stain     Status: None   Collection Time: 03/26/24  4:06 AM   Specimen: Tracheal Aspirate; Respiratory  Result Value Ref Range Status   Specimen Description TRACHEAL ASPIRATE  Final   Special Requests NONE  Final   Gram Stain   Final    FEW WBC PRESENT, PREDOMINANTLY PMN MODERATE GRAM POSITIVE COCCI FEW GRAM NEGATIVE RODS    Culture   Final    FEW Normal respiratory flora-no Staph aureus or Pseudomonas seen Performed at [facility], [address], [place], [place] [address]    Report Status 03/28/2024  FINAL  Final  Culture, blood (Routine X 2) w Reflex to ID Panel     Status: None   Collection Time: 03/30/24  2:19 PM   Specimen: BLOOD  Result Value Ref Range Status   Specimen Description BLOOD SITE NOT SPECIFIED  Final   Special Requests   Final    BOTTLES DRAWN AEROBIC AND ANAEROBIC Blood Culture adequate volume   Culture   Final    NO GROWTH 5 DAYS Performed at [facility], [address], [place], [place] [address]    Report Status 04/04/2024 FINAL  Final  Culture, blood (Routine X 2) w Reflex to ID Panel     Status: None   Collection Time: 03/30/24  2:26 PM   Specimen: BLOOD  Result Value Ref Range Status   Specimen Description BLOOD SITE NOT SPECIFIED  Final   Special Requests   Final    BOTTLES DRAWN AEROBIC AND ANAEROBIC Blood Culture adequate volume   Culture   Final    NO GROWTH 5 DAYS Performed at [facility], [address], [place], [place] [address]    Report Status 04/04/2024 FINAL  Final    RADIOLOGY STUDIES/RESULTS: No results found.   LOS: 10 days   Kimberly Penna, MD  [facility]    To contact the attending provider between 7A-7P or the covering provider during after hours 7P-7A, please log into the web site [email or web address] and access using universal [place] password for that web site. If you do not have the password, please call the hospital operator.  04/04/2024, 10:40 AM

## 2024-04-05 DIAGNOSIS — J9601 Acute respiratory failure with hypoxia: Secondary | ICD-10-CM | POA: Diagnosis not present

## 2024-04-05 DIAGNOSIS — N179 Acute kidney failure, unspecified: Secondary | ICD-10-CM | POA: Diagnosis not present

## 2024-04-05 DIAGNOSIS — G9340 Encephalopathy, unspecified: Secondary | ICD-10-CM | POA: Diagnosis not present

## 2024-04-05 DIAGNOSIS — R4189 Other symptoms and signs involving cognitive functions and awareness: Secondary | ICD-10-CM | POA: Diagnosis not present

## 2024-04-05 LAB — BASIC METABOLIC PANEL WITH GFR
Anion gap: 8 (ref 5–15)
BUN: 20 mg/dL (ref 6–20)
CO2: 22 mmol/L (ref 22–32)
Calcium: 8.9 mg/dL (ref 8.9–10.3)
Chloride: 112 mmol/L — ABNORMAL HIGH (ref 98–111)
Creatinine, Ser: 0.79 mg/dL (ref 0.61–1.24)
GFR, Estimated: >60 mL/min (ref >60)
Glucose, Bld: 109 mg/dL — ABNORMAL HIGH (ref 70–99)
Potassium: 3.9 mmol/L (ref 3.5–5.1)
Sodium: 142 mmol/L (ref 135–145)

## 2024-04-05 LAB — CBC
HCT: 37.7 % — ABNORMAL LOW (ref 39.0–52.0)
Hemoglobin: 12.5 g/dL — ABNORMAL LOW (ref 13.0–17.0)
MCH: 31 pg (ref 26.0–34.0)
MCHC: 33.2 g/dL (ref 30.0–36.0)
MCV: 93.5 fL (ref 80.0–100.0)
Platelets: 406 10*3/uL — ABNORMAL HIGH (ref 150–400)
RBC: 4.03 MIL/uL — ABNORMAL LOW (ref 4.22–5.81)
RDW: 13.7 % (ref 11.5–15.5)
WBC: 9.9 10*3/uL (ref 4.0–10.5)
nRBC: 0 % (ref 0.0–0.2)

## 2024-04-05 LAB — GLUCOSE, CAPILLARY
Glucose-Capillary: 136 mg/dL — ABNORMAL HIGH (ref 70–99)
Glucose-Capillary: 113 mg/dL — ABNORMAL HIGH (ref 70–99)
Glucose-Capillary: 104 mg/dL — ABNORMAL HIGH (ref 70–99)
Glucose-Capillary: 107 mg/dL — ABNORMAL HIGH (ref 70–99)

## 2024-04-05 NOTE — Progress Notes (Signed)
 PROGRESS NOTE        PATIENT DETAILS Name: Ricky Werner Age: 58 y.o. Sex: male Date of Birth: [birth year] Admit Date: 03/25/2024 Admitting Physician Claven Cumming, MD WUJ:[id number], Salena Craven, MD  Brief Summary: Patient is a 58 y.o.  male with history of EtOH use, seizure disorder, anxiety/depression-presented to [facility] with encephalopathy-he was essentially unresponsive-he was intubated and transferred to [facility] ICU.  He was evaluated by neurology-started on Keppra -underwent EEG/neuroimaging-he was thought to have toxic encephalopathy due to polysubstance abuse-focal seizures with Todd's paresis.  He was stabilized-subsequently extubated-and transferred to [facility].  See below for further details.  Significant events: 4/30>> admitted w/ acute encephalopathy intubated 5/2>> near arrest after being repositioned. Brady w pauses, hypoxemic, biting tube.  5/5>> extubated  5/6>> agitated remains on precedex  and HFNC  5/7>> high flow nasal cannula was titrated of HFNC 5/7>> Cortrak tube placed 5/9>> transferred to [facility]-pulled out NG tube.  Significant studies: 4/30>> CT head: No acute intracranial abnormality  4/30>> CT C-spine: No fracture/subluxation. 4/30>> CT chest/abdomen/pelvis: No traumatic injury-findings consistent with aspiration or bronchopneumonia. 5/01>> MRI brain:+ve symmetric restricted diffusion in the bilateral globus pallidus/deep portions of the cerebellum-differentials include toxic exposure/opioid neurotoxicity/anoxic injury. 5/01>> MRI brain: No stenosis 5/01>> echo: EF 55-60%, grade 1 diastolic dysfunction. 5/01-5/02>> LTM EEG: No seizures. 5/04>> MRI C-spine: No stenosis/compression  Significant microbiology data: 4/30>> COVID/influenza/RSV PCR: Negative 4/30>> blood culture 1/2:+ve ACTINOMYCES NAESLUNDII  5/01>> tracheal aspirate: No growth 5/05>> blood culture: No growth  Procedures: See above  Consults: Neurology PCCM  Subjective: Lying  comfortably in bed-no major issues overnight.  He claims he is eating all of his meals.  No family at bedside.  Objective: Vitals: Blood pressure (!) 149/99, pulse 78, temperature 97.7 F (36.5 C), temperature source Oral, resp. rate 20, height 5\' 6"  (1.676 m), weight 87.2 kg, SpO2 98%.   Exam: Gen Exam:Alert awake-not in any distress HEENT:atraumatic, normocephalic Chest: B/L clear to auscultation anteriorly CVS:S1S2 regular Abdomen:soft non tender, non distended Extremities:no edema Neurology: Non focal Skin: no rash  Pertinent Labs/Radiology:    Latest Ref Rng & Units 04/05/2024    6:09 AM 04/02/2024    6:13 AM 04/01/2024    5:06 AM  CBC  WBC 4.0 - 10.5 K/uL 9.9  11.9  13.5   Hemoglobin 13.0 - 17.0 g/dL 47.8  29.5  62.1   Hematocrit 39.0 - 52.0 % 37.7  34.9  33.1   Platelets 150 - 400 K/uL 406  333  282     Lab Results  Component Value Date   NA 142 04/05/2024   K 3.9 04/05/2024   CL 112 (H) 04/05/2024   CO2 22 04/05/2024      Assessment/Plan: Acute hypoxic and hypercarbic respiratory failure-in the setting of severe toxic encephalopathy/aspiration pneumonia Intubated on initial presentation-extubated on 5/5-currently on room air Continue supportive care.  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 Encephalopathy has resolved with supportive care  Probable focal seizures likely related to alcohol  withdrawal/missed benzodiazepine dosage with postictal Todd's paresis Followed closely by neurology-neuroimaging suggestive of opioid overdose related changes LTM EEG negative No further seizures-he is now awake/alert Continue Keppra  Continue Xanax   EtOH abuse with suspicion for alcohol  withdrawal seizures Per prior notes-patient stopped drinking several days prior to this hospitalization Currently on phenobarb taper  Delirium-likely related to alcohol  withdrawal/ICU delirium Seroquel  twice daily for now-will slowly taper  over the next several  days. Mental status improved-awake/alert/cooperative.  Mood disorder Lexapro /benzos  Chronic benzodiazepine use Scheduled Xanax -slow outpatient taper per PCP if possible.  Aspiration pneumonia Actinomyces bacteremia Completed 7 days Unasyn  Repeat blood cultures negative  HTN BP stable-continue clonidine  Resume amlodipine  if BP starts increasing.  Exaggerated vagal tone with sinus bradycardia/frequent sinus pauses with suction or changing position Telemetry monitoring Echo stable EF.  Chronic HFpEF Euvolemic  Oropharyngeal dysphagia Likely due to encephalopathy/debility/deconditioning Patient pulled out NG tube on 5/9-seems to be tolerating diet. Appreciate SLP follow-up.  Debility/deconditioning Secondary to critical illness PT/OT eval-SNF recommended  Polysubstance abuse EtOH/benzos/cocaine/amphetamine-suspicion for opiates although UDS negative (tox screen may not pick up fentanyl ) Continue to counsel  Nutrition Status: Nutrition Problem: Inadequate oral intake Etiology: social / environmental circumstances (EtOH abuse) Signs/Symptoms: per patient/family report Interventions: Ensure Enlive (each supplement provides 350kcal and 20 grams of protein), Magic cup  Code status:   Code Status: Full Code   DVT Prophylaxis: heparin  injection 5,000 Units Start: 03/26/24 1400   Family Communication: None at bedside   Disposition Plan: Status is: Inpatient Remains inpatient appropriate because: Severity of illness   Planned Discharge Destination:Skilled nursing facility   Diet: Diet Order             DIET DYS 3 Room service appropriate? Yes with Assist; Fluid consistency: Thin  Diet effective now                     Antimicrobial agents: Anti-infectives (From admission, onward)    Start     Dose/Rate Route Frequency Ordered Stop   03/25/24 2030  Ampicillin -Sulbactam (UNASYN ) 3 g in sodium chloride  0.9 % 100 mL IVPB        3 g 200 mL/hr over 30  Minutes Intravenous Every 6 hours 03/25/24 2024 03/31/24 1829   03/25/24 1830  cefTRIAXone  (ROCEPHIN ) 1 g in sodium chloride  0.9 % 100 mL IVPB        1 g 200 mL/hr over 30 Minutes Intravenous  Once 03/25/24 1828 03/25/24 2010   03/25/24 1830  azithromycin  (ZITHROMAX ) 500 mg in sodium chloride  0.9 % 250 mL IVPB        500 mg 250 mL/hr over 60 Minutes Intravenous  Once 03/25/24 1828 03/25/24 2126        MEDICATIONS: Scheduled Meds:  ALPRAZolam   1 mg Oral TID   Chlorhexidine  Gluconate Cloth  6 each Topical Q2200   cloNIDine   0.1 mg Oral TID   vitamin B-12  1,000 mcg Oral Daily   escitalopram   10 mg Oral QHS   feeding supplement  237 mL Oral BID BM   folic acid   1 mg Oral Daily   heparin   5,000 Units Subcutaneous Q8H   levETIRAcetam   1,000 mg Oral BID   multivitamin with minerals  1 tablet Oral Daily   PHENObarbital   32.5 mg Intravenous Q8H   polyethylene glycol  17 g Oral Daily   QUEtiapine   100 mg Oral BID   thiamine   100 mg Oral Daily   Continuous Infusions:   PRN Meds:.acetaminophen , haloperidol  lactate, ipratropium-albuterol , mouth rinse, oxyCODONE , polyethylene glycol   I have personally reviewed following labs and imaging studies  LABORATORY DATA: CBC: Recent Labs  Lab 03/30/24 0647 03/30/24 1046 03/31/24 0620 04/01/24 0506 04/02/24 0613 04/05/24 0609  WBC 8.7  --  11.2* 13.5* 11.9* 9.9  NEUTROABS 4.4  --   --   --   --   --   HGB 10.3* 8.8* 11.0*  11.1* 11.9* 12.5*  HCT 31.8* 26.0* 32.6* 33.1* 34.9* 37.7*  MCV 97.2  --  93.1 92.2 92.6 93.5  PLT 221  --  246 282 333 406*    Basic Metabolic Panel: Recent Labs  Lab 03/30/24 0647 03/30/24 1046 03/31/24 0620 03/31/24 1902 04/01/24 0506 04/02/24 0613 04/05/24 0609  NA 142 139 139 137  --  140 142  K 3.6 3.5 4.2 3.8  --  3.6 3.9  CL 107  --  104 101  --  105 112*  CO2 26  --  23 23  --  24 22  GLUCOSE 111*  --  97 101*  --  127* 109*  BUN 13  --  12 14  --  17 20  CREATININE 0.73  --  0.77 0.93  --   0.67 0.79  CALCIUM  8.7*  --  8.7* 8.9  --  8.8* 8.9  MG 1.7  --  1.8  --  2.0  --   --   PHOS 5.5*  --  3.6  --   --   --   --     GFR: Estimated Creatinine Clearance: 101.7 mL/min (by C-G formula based on SCr of 0.79 mg/dL).  Liver Function Tests: No results for input(s): "AST", "ALT", "ALKPHOS", "BILITOT", "PROT", "ALBUMIN" in the last 168 hours. No results for input(s): "LIPASE", "AMYLASE" in the last 168 hours. No results for input(s): "AMMONIA" in the last 168 hours.  Coagulation Profile: No results for input(s): "INR", "PROTIME" in the last 168 hours.  Cardiac Enzymes: No results for input(s): "CKTOTAL", "CKMB", "CKMBINDEX", "TROPONINI" in the last 168 hours.  BNP (last 3 results) No results for input(s): "PROBNP" in the last 8760 hours.  Lipid Profile: No results for input(s): "CHOL", "HDL", "LDLCALC", "TRIG", "CHOLHDL", "LDLDIRECT" in the last 72 hours.   Thyroid Function Tests: No results for input(s): "TSH", "T4TOTAL", "FREET4", "T3FREE", "THYROIDAB" in the last 72 hours.  Anemia Panel: No results for input(s): "VITAMINB12", "FOLATE", "FERRITIN", "TIBC", "IRON", "RETICCTPCT" in the last 72 hours.  Urine analysis:    Component Value Date/Time   COLORURINE YELLOW 03/25/2024 2143   APPEARANCEUR HAZY (A) 03/25/2024 2143   LABSPEC 1.018 03/25/2024 2143   PHURINE 5.0 03/25/2024 2143   GLUCOSEU >=500 (A) 03/25/2024 2143   HGBUR NEGATIVE 03/25/2024 2143   BILIRUBINUR NEGATIVE 03/25/2024 2143   KETONESUR NEGATIVE 03/25/2024 2143   PROTEINUR 30 (A) 03/25/2024 2143   NITRITE NEGATIVE 03/25/2024 2143   LEUKOCYTESUR NEGATIVE 03/25/2024 2143    Sepsis Labs: Lactic Acid, Venous    Component Value Date/Time   LATICACIDVEN 0.9 03/27/2024 2059    MICROBIOLOGY: Recent Results (from the past 240 hours)  Culture, blood (Routine X 2) w Reflex to ID Panel     Status: None   Collection Time: 03/30/24  2:19 PM   Specimen: BLOOD  Result Value Ref Range Status   Specimen  Description BLOOD SITE NOT SPECIFIED  Final   Special Requests   Final    BOTTLES DRAWN AEROBIC AND ANAEROBIC Blood Culture adequate volume   Culture   Final    NO GROWTH 5 DAYS Performed at [facility], [address] [place], [place] [address]    Report Status 04/04/2024 FINAL  Final  Culture, blood (Routine X 2) w Reflex to ID Panel     Status: None   Collection Time: 03/30/24  2:26 PM   Specimen: BLOOD  Result Value Ref Range Status   Specimen Description BLOOD SITE  NOT SPECIFIED  Final   Special Requests   Final    BOTTLES DRAWN AEROBIC AND ANAEROBIC Blood Culture adequate volume   Culture   Final    NO GROWTH 5 DAYS Performed at [facility], [address] [place], [place] [address]    Report Status 04/04/2024 FINAL  Final    RADIOLOGY STUDIES/RESULTS: No results found.   LOS: 11 days   Kimberly Penna, MD  [facility]    To contact the attending provider between 7A-7P or the covering provider during after hours 7P-7A, please log into the web site [email or web address] and access using universal [place] password for that web site. If you do not have the password, please call the hospital operator.  04/05/2024, 10:15 AM

## 2024-04-06 DIAGNOSIS — G9340 Encephalopathy, unspecified: Secondary | ICD-10-CM | POA: Diagnosis not present

## 2024-04-06 DIAGNOSIS — R4189 Other symptoms and signs involving cognitive functions and awareness: Secondary | ICD-10-CM | POA: Diagnosis not present

## 2024-04-06 DIAGNOSIS — J9601 Acute respiratory failure with hypoxia: Secondary | ICD-10-CM | POA: Diagnosis not present

## 2024-04-06 DIAGNOSIS — N179 Acute kidney failure, unspecified: Secondary | ICD-10-CM | POA: Diagnosis not present

## 2024-04-06 LAB — GLUCOSE, CAPILLARY
Glucose-Capillary: 144 mg/dL — ABNORMAL HIGH (ref 70–99)
Glucose-Capillary: 107 mg/dL — ABNORMAL HIGH (ref 70–99)
Glucose-Capillary: 144 mg/dL — ABNORMAL HIGH (ref 70–99)

## 2024-04-06 NOTE — Progress Notes (Signed)
 PROGRESS NOTE        PATIENT DETAILS Name: Ricky Werner Age: 58 y.o. Sex: male Date of Birth: Aug 14, 1966 Admit Date: 03/25/2024 Admitting Physician Claven Cumming, MD GEX:[id number], Salena Craven, MD  Brief Summary: Patient is a 58 y.o.  male with history of EtOH use, seizure disorder, anxiety/depression-presented to [facility] with encephalopathy-he was essentially unresponsive-he was intubated and transferred to [facility] ICU.  He was evaluated by neurology-started on Keppra -underwent EEG/neuroimaging-he was thought to have toxic encephalopathy due to polysubstance abuse-focal seizures with Todd's paresis.  He was stabilized-subsequently extubated-and transferred to [facility].  See below for further details.  Significant events: 4/30>> admitted w/ acute encephalopathy intubated 5/2>> near arrest after being repositioned. Brady w pauses, hypoxemic, biting tube.  5/5>> extubated  5/6>> agitated remains on precedex  and HFNC  5/7>> high flow nasal cannula was titrated of HFNC 5/7>> Cortrak tube placed 5/9>> transferred to [facility]-pulled out NG tube.  Significant studies: 4/30>> CT head: No acute intracranial abnormality  4/30>> CT C-spine: No fracture/subluxation. 4/30>> CT chest/abdomen/pelvis: No traumatic injury-findings consistent with aspiration or bronchopneumonia. 5/01>> MRI brain:+ve symmetric restricted diffusion in the bilateral globus pallidus/deep portions of the cerebellum-differentials include toxic exposure/opioid neurotoxicity/anoxic injury. 5/01>> MRI brain: No stenosis 5/01>> echo: EF 55-60%, grade 1 diastolic dysfunction. 5/01-5/02>> LTM EEG: No seizures. 5/04>> MRI C-spine: No stenosis/compression  Significant microbiology data: 4/30>> COVID/influenza/RSV PCR: Negative 4/30>> blood culture 1/2:+ve ACTINOMYCES NAESLUNDII  5/01>> tracheal aspirate: No growth 5/05>> blood culture: No growth  Procedures: See  above  Consults: Neurology PCCM  Subjective: Awake/alert-no major issues overnight.  Awaiting SNF placement at this point.  Objective: Vitals: Blood pressure 118/83, pulse 94, temperature 98 F (36.7 C), temperature source Oral, resp. rate 19, height 5\' 6"  (1.676 m), weight 80.3 kg, SpO2 99%.   Exam: Awake/alert Not in any distress Abdomen: Soft nontender nondistended Extremities no edema Nonfocal exam.  Pertinent Labs/Radiology:    Latest Ref Rng & Units 04/05/2024    6:09 AM 04/02/2024    6:13 AM 04/01/2024    5:06 AM  CBC  WBC 4.0 - 10.5 K/uL 9.9  11.9  13.5   Hemoglobin 13.0 - 17.0 g/dL 32.4  40.1  02.7   Hematocrit 39.0 - 52.0 % 37.7  34.9  33.1   Platelets 150 - 400 K/uL 406  333  282     Lab Results  Component Value Date   NA 142 04/05/2024   K 3.9 04/05/2024   CL 112 (H) 04/05/2024   CO2 22 04/05/2024      Assessment/Plan: Acute hypoxic and hypercarbic respiratory failure-in the setting of severe toxic encephalopathy/aspiration pneumonia Intubated on initial presentation-extubated on 5/5-currently on room air Continue supportive care.  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 Encephalopathy has resolved with supportive care  Probable focal seizures likely related to alcohol  withdrawal/missed benzodiazepine dosage with postictal Todd's paresis Followed closely by neurology-neuroimaging suggestive of opioid overdose related changes LTM EEG negative No further seizures-he is now awake/alert Continue Keppra  Continue Xanax   EtOH abuse with suspicion for alcohol  withdrawal seizures Per prior notes-patient stopped drinking several days prior to this hospitalization Will finish phenobarb taper 5/.  Delirium-likely related to alcohol  withdrawal/ICU delirium Seroquel  twice daily for now-will slowly taper over the next several days. Mental status improved-awake/alert/cooperative.  Mood disorder Lexapro /benzos  Chronic benzodiazepine  use Scheduled Xanax -slow outpatient taper per PCP if possible.  Aspiration pneumonia Actinomyces bacteremia Completed 7 days Unasyn  Repeat blood cultures negative  HTN BP stable-continue clonidine  Resume amlodipine  if BP starts increasing.  Exaggerated vagal tone with sinus bradycardia/frequent sinus pauses with suction or changing position Telemetry monitoring Echo stable EF.  Chronic HFpEF Euvolemic  Oropharyngeal dysphagia Likely due to encephalopathy/debility/deconditioning Patient pulled out NG tube on 5/9-seems to be tolerating diet. Appreciate SLP follow-up.  Debility/deconditioning Secondary to critical illness PT/OT eval-SNF recommended  Polysubstance abuse EtOH/benzos/cocaine/amphetamine-suspicion for opiates although UDS negative (tox screen may not pick up fentanyl ) Continue to counsel  Nutrition Status: Nutrition Problem: Inadequate oral intake Etiology: social / environmental circumstances (EtOH abuse) Signs/Symptoms: per patient/family report Interventions: Ensure Enlive (each supplement provides 350kcal and 20 grams of protein), Magic cup  Code status:   Code Status: Full Code   DVT Prophylaxis: heparin  injection 5,000 Units Start: 03/26/24 1400   Family Communication: None at bedside   Disposition Plan: Status is: Inpatient Remains inpatient appropriate because: Severity of illness   Planned Discharge Destination:Skilled nursing facility   Diet: Diet Order             DIET DYS 3 Room service appropriate? Yes with Assist; Fluid consistency: Thin  Diet effective now                     Antimicrobial agents: Anti-infectives (From admission, onward)    Start     Dose/Rate Route Frequency Ordered Stop   03/25/24 2030  Ampicillin -Sulbactam (UNASYN ) 3 g in sodium chloride  0.9 % 100 mL IVPB        3 g 200 mL/hr over 30 Minutes Intravenous Every 6 hours 03/25/24 2024 03/31/24 1829   03/25/24 1830  cefTRIAXone  (ROCEPHIN ) 1 g in sodium  chloride 0.9 % 100 mL IVPB        1 g 200 mL/hr over 30 Minutes Intravenous  Once 03/25/24 1828 03/25/24 2010   03/25/24 1830  azithromycin  (ZITHROMAX ) 500 mg in sodium chloride  0.9 % 250 mL IVPB        500 mg 250 mL/hr over 60 Minutes Intravenous  Once 03/25/24 1828 03/25/24 2126        MEDICATIONS: Scheduled Meds:  ALPRAZolam   1 mg Oral TID   Chlorhexidine  Gluconate Cloth  6 each Topical Q2200   cloNIDine   0.1 mg Oral TID   vitamin B-12  1,000 mcg Oral Daily   escitalopram   10 mg Oral QHS   feeding supplement  237 mL Oral BID BM   folic acid   1 mg Oral Daily   heparin   5,000 Units Subcutaneous Q8H   levETIRAcetam   1,000 mg Oral BID   multivitamin with minerals  1 tablet Oral Daily   PHENObarbital   32.5 mg Intravenous Q8H   polyethylene glycol  17 g Oral Daily   QUEtiapine   100 mg Oral BID   thiamine   100 mg Oral Daily   Continuous Infusions:   PRN Meds:.acetaminophen , haloperidol  lactate, ipratropium-albuterol , mouth rinse, oxyCODONE , polyethylene glycol   I have personally reviewed following labs and imaging studies  LABORATORY DATA: CBC: Recent Labs  Lab 03/30/24 1046 03/31/24 0620 04/01/24 0506 04/02/24 0613 04/05/24 0609  WBC  --  11.2* 13.5* 11.9* 9.9  HGB 8.8* 11.0* 11.1* 11.9* 12.5*  HCT 26.0* 32.6* 33.1* 34.9* 37.7*  MCV  --  93.1 92.2 92.6 93.5  PLT  --  246 282 333 406*    Basic Metabolic Panel: Recent Labs  Lab 03/30/24 1046 03/31/24 0620 03/31/24 1902 04/01/24 0506 04/02/24 1610  04/05/24 0609  NA 139 139 137  --  140 142  K 3.5 4.2 3.8  --  3.6 3.9  CL  --  104 101  --  105 112*  CO2  --  23 23  --  24 22  GLUCOSE  --  97 101*  --  127* 109*  BUN  --  12 14  --  17 20  CREATININE  --  0.77 0.93  --  0.67 0.79  CALCIUM   --  8.7* 8.9  --  8.8* 8.9  MG  --  1.8  --  2.0  --   --   PHOS  --  3.6  --   --   --   --     GFR: Estimated Creatinine Clearance: 97.8 mL/min (by C-G formula based on SCr of 0.79 mg/dL).  Liver Function  Tests: No results for input(s): "AST", "ALT", "ALKPHOS", "BILITOT", "PROT", "ALBUMIN" in the last 168 hours. No results for input(s): "LIPASE", "AMYLASE" in the last 168 hours. No results for input(s): "AMMONIA" in the last 168 hours.  Coagulation Profile: No results for input(s): "INR", "PROTIME" in the last 168 hours.  Cardiac Enzymes: No results for input(s): "CKTOTAL", "CKMB", "CKMBINDEX", "TROPONINI" in the last 168 hours.  BNP (last 3 results) No results for input(s): "PROBNP" in the last 8760 hours.  Lipid Profile: No results for input(s): "CHOL", "HDL", "LDLCALC", "TRIG", "CHOLHDL", "LDLDIRECT" in the last 72 hours.   Thyroid Function Tests: No results for input(s): "TSH", "T4TOTAL", "FREET4", "T3FREE", "THYROIDAB" in the last 72 hours.  Anemia Panel: No results for input(s): "VITAMINB12", "FOLATE", "FERRITIN", "TIBC", "IRON", "RETICCTPCT" in the last 72 hours.  Urine analysis:    Component Value Date/Time   COLORURINE YELLOW 03/25/2024 2143   APPEARANCEUR HAZY (A) 03/25/2024 2143   LABSPEC 1.018 03/25/2024 2143   PHURINE 5.0 03/25/2024 2143   GLUCOSEU >=500 (A) 03/25/2024 2143   HGBUR NEGATIVE 03/25/2024 2143   BILIRUBINUR NEGATIVE 03/25/2024 2143   KETONESUR NEGATIVE 03/25/2024 2143   PROTEINUR 30 (A) 03/25/2024 2143   NITRITE NEGATIVE 03/25/2024 2143   LEUKOCYTESUR NEGATIVE 03/25/2024 2143    Sepsis Labs: Lactic Acid, Venous    Component Value Date/Time   LATICACIDVEN 0.9 03/27/2024 2059    MICROBIOLOGY: Recent Results (from the past 240 hours)  Culture, blood (Routine X 2) w Reflex to ID Panel     Status: None   Collection Time: 03/30/24  2:19 PM   Specimen: BLOOD  Result Value Ref Range Status   Specimen Description BLOOD SITE NOT SPECIFIED  Final   Special Requests   Final    BOTTLES DRAWN AEROBIC AND ANAEROBIC Blood Culture adequate volume   Culture   Final    NO GROWTH 5 DAYS Performed at [facility], [address], [place],  [place] [address]    Report Status 04/04/2024 FINAL  Final  Culture, blood (Routine X 2) w Reflex to ID Panel     Status: None   Collection Time: 03/30/24  2:26 PM   Specimen: BLOOD  Result Value Ref Range Status   Specimen Description BLOOD SITE NOT SPECIFIED  Final   Special Requests   Final    BOTTLES DRAWN AEROBIC AND ANAEROBIC Blood Culture adequate volume   Culture   Final    NO GROWTH 5 DAYS Performed at [facility], [address], [place], [place] [address]    Report Status 04/04/2024 FINAL  Final    RADIOLOGY STUDIES/RESULTS:  No results found.   LOS: 12 days   Kimberly Penna, MD  [facility]    To contact the attending provider between 7A-7P or the covering provider during after hours 7P-7A, please log into the web site [email or web address] and access using universal [place] password for that web site. If you do not have the password, please call the hospital operator.  04/06/2024, 10:03 AM

## 2024-04-06 NOTE — Progress Notes (Signed)
 Speech Language Pathology Treatment: Dysphagia  Patient Details Name: Ricky Werner MRN: [id number] DOB: 05-25-1966 Today's Date: 04/06/2024 Time: 8469-6295 SLP Time Calculation (min) (ACUTE ONLY): 8 min  Assessment / Plan / Recommendation Clinical Impression  Pt fed himself trials of thin liquids and solids without overt s/s of dysphagia or aspiration. Discussed upgrading diet to regular but pt states he would prefer to continue with Dys 3 as it is easier to self-feed. If pt decides he likes to advance, this can be done per RN/MD discretion. No further SLP f/u is clinically indicated at this time, will sign off.    HPI HPI: Ricky Werner is a 58 yo male presenting 4/30 after being found unresponsive. W/u revealed L sided weakness, CTH negative. CT Chest pending. UDS + for cocaine, benzo, amphetamines. Admitted for managements of seizure with post-ictal Todd's paralysis and toxic metabolic encephalopathy in the setting of hyperglycemia and cocaine abuse. ETT 4/30-5/5. PMH includes alcohol  abuse and benzodiazepine dependence, tobacco use, HTN, alcohol  withdrawal seizures as well as concern for unprovoked seizures on Keppra       SLP Plan  All goals met      Recommendations for follow up therapy are one component of a multi-disciplinary discharge planning process, led by the attending physician.  Recommendations may be updated based on patient status, additional functional criteria and insurance authorization.    Recommendations  Diet recommendations: Dysphagia 3 (mechanical soft);Thin liquid Liquids provided via: Cup;Straw Medication Administration: Whole meds with puree Supervision: Patient able to self feed Compensations: Minimize environmental distractions;Slow rate;Small sips/bites Postural Changes and/or Swallow Maneuvers: Seated upright 90 degrees                  Oral care BID   Frequent or constant Supervision/Assistance Dysphagia, unspecified (R13.10)     All goals  met     Amil Kale, M.A., CCC-SLP Speech Language Pathology, Acute Rehabilitation Services  Secure Chat preferred [phone]   04/06/2024, 4:02 PM

## 2024-04-06 NOTE — Progress Notes (Signed)
 Physical Therapy Treatment Patient Details Name: Ricky Werner MRN: [id number] DOB: 1966-05-07 Today's Date: 04/06/2024   History of Present Illness The pt is a 58 yo male presenting 4/30 after being found unresponsive. Work up revealed L-sided weakness, CT head negative. UDS + for cocaine, benzo, amphetamines. Pt admitted for management of seizure with post-ictal Todd's paralysis and toxic metabolic encephalopathy in the setting of hyperglycemia and cocaine abuse. ETT 4/30-5/5. PMH includes: alcohol  abuse and benzodiazepine dependence, cigarette smoking, HTN, alcohol  withdrawal seizures as well as concern for unprovoked seizures on Keppra     PT Comments  Remarkable turnaround with function improving dramatically since last visit. Scored 20/24 with DGI indicating low fall risk. Shows some mild episodes of instability but demonstrates good awareness and is able to self correct. Recs updated. No ambulatory equipment recommended. Will continue to follow and progress during admission. Patient will continue to benefit from skilled physical therapy services to further improve independence with functional mobility.     If plan is discharge home, recommend the following: Assistance with cooking/housework;Assist for transportation   Can travel by private vehicle        Equipment Recommendations  None recommended by PT    Recommendations for Other Services       Precautions / Restrictions Precautions Precautions: Fall;Other (comment) (seizure) Recall of Precautions/Restrictions: Intact Restrictions Weight Bearing Restrictions Per Provider Order: No     Mobility  Bed Mobility Overal bed mobility: Modified Independent             General bed mobility comments: Mod I  to rise and return to bed without physical assist. Managed lines/leads only.    Transfers Overall transfer level: Needs assistance Equipment used: None Transfers: Sit to/from Stand Sit to Stand: Supervision            General transfer comment: Supervision for safety, minimal sway, able to self correct without assistive device.    Ambulation/Gait Ambulation/Gait assistance: Supervision Gait Distance (Feet): 175 Feet Assistive device: None Gait Pattern/deviations: Step-through pattern, Decreased stride length, Drifts right/left Gait velocity: decr Gait velocity interpretation: 1.31 - 2.62 ft/sec, indicative of limited community ambulator   General Gait Details: Minor drift with dynamic challenges, able to self correct without physical intervention or assistive device. No buckling noted. HR to 120s while ambulating.   Stairs             Wheelchair Mobility     Tilt Bed    Modified Rankin (Stroke Patients Only)       Balance Overall balance assessment: Mild deficits observed, not formally tested                               Standardized Balance Assessment Standardized Balance Assessment : Dynamic Gait Index   Dynamic Gait Index Level Surface: Normal Change in Gait Speed: Mild Impairment Gait with Horizontal Head Turns: Mild Impairment Gait with Vertical Head Turns: Mild Impairment Gait and Pivot Turn: Normal Step Over Obstacle: Normal Step Around Obstacles: Normal Steps: Mild Impairment Total Score: 20      Communication Communication Communication: No apparent difficulties  Cognition Arousal: Alert Behavior During Therapy: WFL for tasks assessed/performed   PT - Cognitive impairments: No apparent impairments                       PT - Cognition Comments: Requires some extra time to recall month/year. Following commands: Intact      Cueing  Cueing Techniques: Verbal cues  Exercises General Exercises - Lower Extremity Ankle Circles/Pumps: AROM, Both, 10 reps, Seated Quad Sets: Strengthening, Both, 10 reps, Supine Gluteal Sets: Strengthening, Both, 10 reps, Supine Long Arc Quad: Strengthening, Both, 10 reps, Seated Hip Flexion/Marching:  Strengthening, Both, 10 reps, Seated    General Comments General comments (skin integrity, edema, etc.): HR into 120s while ambulating. 105 at rest.      Pertinent Vitals/Pain Pain Assessment Pain Assessment: No/denies pain    Home Living                          Prior Function            PT Goals (current goals can now be found in the care plan section) Acute Rehab PT Goals Patient Stated Goal: to go home PT Goal Formulation: With patient Time For Goal Achievement: 04/14/24 Potential to Achieve Goals: Good Progress towards PT goals: Progressing toward goals    Frequency    Min 2X/week      PT Plan      Co-evaluation              AM-PAC PT "6 Clicks" Mobility   Outcome Measure  Help needed turning from your back to your side while in a flat bed without using bedrails?: None Help needed moving from lying on your back to sitting on the side of a flat bed without using bedrails?: None Help needed moving to and from a bed to a chair (including a wheelchair)?: A Little Help needed standing up from a chair using your arms (e.g., wheelchair or bedside chair)?: A Little Help needed to walk in hospital room?: A Little Help needed climbing 3-5 steps with a railing? : A Little 6 Click Score: 20    End of Session Equipment Utilized During Treatment: Gait belt Activity Tolerance: Patient tolerated treatment well Patient left: in bed;with call bell/phone within reach;with bed alarm set   PT Visit Diagnosis: Unsteadiness on feet (R26.81);Other abnormalities of gait and mobility (R26.89);Difficulty in walking, not elsewhere classified (R26.2)     Time: 2956-2130 PT Time Calculation (min) (ACUTE ONLY): 11 min  Charges:    $Gait Training: 8-22 mins PT General Charges $$ ACUTE PT VISIT: 1 Visit                     Jory Ng, PT, DPT [facility]  Rehabilitation Services Physical Therapist Office: [phone] Website: [email or web address]    Alinda Irani 04/06/2024, 6:22 PM

## 2024-04-07 ENCOUNTER — Other Ambulatory Visit (HOSPITAL_COMMUNITY): Payer: Self-pay

## 2024-04-07 DIAGNOSIS — N179 Acute kidney failure, unspecified: Secondary | ICD-10-CM | POA: Diagnosis not present

## 2024-04-07 DIAGNOSIS — J9601 Acute respiratory failure with hypoxia: Secondary | ICD-10-CM | POA: Diagnosis not present

## 2024-04-07 DIAGNOSIS — G9340 Encephalopathy, unspecified: Secondary | ICD-10-CM | POA: Diagnosis not present

## 2024-04-07 DIAGNOSIS — R4189 Other symptoms and signs involving cognitive functions and awareness: Secondary | ICD-10-CM | POA: Diagnosis not present

## 2024-04-07 LAB — GLUCOSE, CAPILLARY
Glucose-Capillary: 120 mg/dL — ABNORMAL HIGH (ref 70–99)
Glucose-Capillary: 94 mg/dL (ref 70–99)

## 2024-04-07 MED ORDER — QUETIAPINE FUMARATE 50 MG PO TABS
50.0000 mg | ORAL_TABLET | Freq: Two times a day (BID) | ORAL | Status: DC
  Administered 2024-04-07: 50 mg via ORAL
  Filled 2024-04-07: qty 1

## 2024-04-07 MED ORDER — CYANOCOBALAMIN 1000 MCG PO TABS: 1000.0000 ug | ORAL_TABLET | Freq: Every day | ORAL | 0 refills | Status: AC

## 2024-04-07 MED ORDER — ALPRAZOLAM 2 MG PO TABS: 1.0000 mg | ORAL_TABLET | Freq: Three times a day (TID) | ORAL | Status: DC

## 2024-04-07 MED ORDER — FOLIC ACID 1 MG PO TABS: 1.0000 mg | ORAL_TABLET | Freq: Every day | ORAL | 0 refills | Status: AC

## 2024-04-07 MED ORDER — AMLODIPINE BESYLATE 5 MG PO TABS: 5.0000 mg | ORAL_TABLET | Freq: Every day | ORAL | 1 refills | Status: AC

## 2024-04-07 MED ORDER — QUETIAPINE FUMARATE 50 MG PO TABS: 50.0000 mg | ORAL_TABLET | Freq: Two times a day (BID) | ORAL | 0 refills | Status: AC

## 2024-04-07 MED ORDER — ADULT MULTIVITAMIN W/MINERALS CH: 1.0000 | ORAL_TABLET | Freq: Every day | ORAL | 0 refills | Status: AC

## 2024-04-07 MED ORDER — CLONIDINE HCL 0.1 MG PO TABS: 0.1000 mg | ORAL_TABLET | Freq: Three times a day (TID) | ORAL | 1 refills | Status: AC

## 2024-04-07 NOTE — Progress Notes (Signed)
 Physical Therapy Treatment and Discharge Patient Details Name: Ricky Werner MRN: [id number] DOB: 1966-09-24 Today's Date: 04/07/2024   History of Present Illness The pt is a 58 yo male presenting 4/30 after being found unresponsive. Work up revealed L-sided weakness, CT head negative. UDS + for cocaine, benzo, amphetamines. Pt admitted for management of seizure with post-ictal Todd's paralysis and toxic metabolic encephalopathy in the setting of hyperglycemia and cocaine abuse. ETT 4/30-5/5. PMH includes: alcohol  abuse and benzodiazepine dependence, cigarette smoking, HTN, alcohol  withdrawal seizures as well as concern for unprovoked seizures on Keppra     PT Comments  Mobilizing at a Mod I to fully independent level today. DGI 21/24 indicating low fall risk. Safely completed stair training similar to his described home set-up for home entry. States he uses a SPC at baseline intermittently for balance. Educated on OPPT recommendation and follow-up if symptoms persist. Safe for d/c from mobility standpoint. Acute goals met.   Patient discharged from PT services secondary to goals met and no further PT needs identified.   Progress and discharge plan discussed with patient and/or caregiver: Patient/Caregiver agrees with plan    If plan is discharge home, recommend the following: Assist for transportation     Equipment Recommendations  None recommended by PT       Precautions / Restrictions Precautions Precautions: Fall;Other (comment) (seizure) Recall of Precautions/Restrictions: Intact Restrictions Weight Bearing Restrictions Per Provider Order: No     Mobility  Bed Mobility Overal bed mobility: Independent                  Transfers Overall transfer level: Modified independent Equipment used: None Transfers: Sit to/from Stand             General transfer comment: Mod I stood twice from bed without issues. Managed lines/leads only.     Ambulation/Gait Ambulation/Gait assistance: Modified independent (Device/Increase time) Gait Distance (Feet): 220 Feet Assistive device: None Gait Pattern/deviations: WFL(Within Functional Limits)   Gait velocity interpretation: >2.62 ft/sec, indicative of community ambulatory   General Gait Details: Stable, no assistive device, tolerated dynamic challenges without LOB including backwards steps, high march, quick turns, variable speeds.   Stairs Stairs: Yes Stairs assistance: Modified independent (Device/Increase time) Stair Management: One rail Right, Alternating pattern, Forwards Number of Stairs: 13 General stair comments: Cues for sequencing, rail use, safety and awareness. Navigates at mod I level without LOB, alternating step pattern. States he feels confident with task, denies need for further practice.   Wheelchair Mobility     Tilt Bed    Modified Rankin (Stroke Patients Only)       Balance Overall balance assessment: Mild deficits observed, not formally tested                               Standardized Balance Assessment Standardized Balance Assessment : Dynamic Gait Index   Dynamic Gait Index Level Surface: Normal Change in Gait Speed: Mild Impairment Gait with Horizontal Head Turns: Mild Impairment Gait with Vertical Head Turns: Normal Gait and Pivot Turn: Normal Step Over Obstacle: Normal Step Around Obstacles: Normal Steps: Mild Impairment Total Score: 21      Communication Communication Communication: No apparent difficulties  Cognition Arousal: Alert Behavior During Therapy: WFL for tasks assessed/performed   PT - Cognitive impairments: No apparent impairments                         Following  commands: Intact      Cueing Cueing Techniques: Verbal cues  Exercises      General Comments General comments (skin integrity, edema, etc.): HR 113 while ambulating. Denies dizziness.      Pertinent Vitals/Pain Pain  Assessment Pain Assessment: No/denies pain    Home Living                          Prior Function            PT Goals (current goals can now be found in the care plan section) Acute Rehab PT Goals Patient Stated Goal: to go home PT Goal Formulation: With patient Time For Goal Achievement: 04/14/24 Potential to Achieve Goals: Good Progress towards PT goals: Goals met/education completed, patient discharged from PT    Frequency    Min 2X/week      PT Plan      Co-evaluation              AM-PAC PT "6 Clicks" Mobility   Outcome Measure  Help needed turning from your back to your side while in a flat bed without using bedrails?: None Help needed moving from lying on your back to sitting on the side of a flat bed without using bedrails?: None Help needed moving to and from a bed to a chair (including a wheelchair)?: None Help needed standing up from a chair using your arms (e.g., wheelchair or bedside chair)?: None Help needed to walk in hospital room?: None Help needed climbing 3-5 steps with a railing? : None 6 Click Score: 24    End of Session   Activity Tolerance: Patient tolerated treatment well Patient left: in bed;with call bell/phone within reach   PT Visit Diagnosis: Unsteadiness on feet (R26.81);Other abnormalities of gait and mobility (R26.89);Difficulty in walking, not elsewhere classified (R26.2)     Time: 4098-1191 PT Time Calculation (min) (ACUTE ONLY): 9 min  Charges:    $Gait Training: 8-22 mins PT General Charges $$ ACUTE PT VISIT: 1 Visit                     Jory Ng, PT, DPT [facility]  Rehabilitation Services Physical Therapist Office: [phone] Website: [email or web address]    Alinda Irani 04/07/2024, 9:24 AM

## 2024-04-07 NOTE — Progress Notes (Signed)
 Occupational Therapy Treatment Patient Details Name: Ricky Werner MRN: [id number] DOB: Jan 19, 1966 Today's Date: 04/07/2024   History of present illness The pt is a 58 yo male presenting 4/30 after being found unresponsive. Work up revealed L-sided weakness, CT head negative. UDS + for cocaine, benzo, amphetamines. Pt admitted for management of seizure with post-ictal Todd's paralysis and toxic metabolic encephalopathy in the setting of hyperglycemia and cocaine abuse. ETT 4/30-5/5. PMH includes: alcohol  abuse and benzodiazepine dependence, cigarette smoking, HTN, alcohol  withdrawal seizures as well as concern for unprovoked seizures on Keppra    OT comments  Pt has made great progress since last OT session. Pt able to mobilize around unit without AD and manage ADLs in room without issues. Discussed safety with tub transfers at home and encouraged assistance with med mgmt initially to maximize compliance after discharge. No OT needs upon DC.       If plan is discharge home, recommend the following:  Direct supervision/assist for financial management;Direct supervision/assist for medications management;Assist for transportation   Equipment Recommendations  None recommended by OT    Recommendations for Other Services      Precautions / Restrictions Precautions Precautions: Fall Restrictions Weight Bearing Restrictions Per Provider Order: No       Mobility Bed Mobility Overal bed mobility: Independent                  Transfers Overall transfer level: Independent Equipment used: None Transfers: Sit to/from Stand Sit to Stand: Independent                 Balance Overall balance assessment: Mild deficits observed, not formally tested                                         ADL either performed or assessed with clinical judgement   ADL Overall ADL's : Needs assistance/impaired                 Upper Body Dressing : Modified  independent;Standing Upper Body Dressing Details (indicate cue type and reason): doffing gown from backside in standing Lower Body Dressing: Sit to/from stand;Sitting/lateral leans;Modified independent Lower Body Dressing Details (indicate cue type and reason): doffing/donning socks EOB   Toilet Transfer Details (indicate cue type and reason): reports he has been going to/from bathroom without assist now.         Functional mobility during ADLs: Supervision/safety General ADL Comments: able to mobilize around unit without AD, discussed tub transfers and safety strategies to decrease fall risk, med mgmt with son assist. pt reports son handles the bills    Extremity/Trunk Assessment Upper Extremity Assessment Upper Extremity Assessment: Overall WFL for tasks assessed;Right hand dominant   Lower Extremity Assessment Lower Extremity Assessment: Defer to PT evaluation        Vision   Vision Assessment?: No apparent visual deficits   Perception     Praxis     Communication Communication Communication: No apparent difficulties   Cognition Arousal: Alert Behavior During Therapy: WFL for tasks assessed/performed Cognition: No family/caregiver present to determine baseline             OT - Cognition Comments: memory deficits regarding situation/events that led to hospitalization. able to follow directions, minor decreased insight into deficits. discussed med mgmt at home and encouraged pt to have son assist/go over meds initially at DC to maximize compliance  Following commands: Intact        Cueing   Cueing Techniques: Verbal cues  Exercises      Shoulder Instructions       General Comments HR 113 while ambulating. Denies dizziness.    Pertinent Vitals/ Pain       Pain Assessment Pain Assessment: No/denies pain  Home Living                                          Prior Functioning/Environment               Frequency  Min 2X/week        Progress Toward Goals  OT Goals(current goals can now be found in the care plan section)  Progress towards OT goals: Progressing toward goals  Acute Rehab OT Goals Patient Stated Goal: go home today OT Goal Formulation: With patient Time For Goal Achievement: 04/14/24 Potential to Achieve Goals: Good ADL Goals Pt Will Perform Upper Body Bathing: with min assist Pt Will Perform Lower Body Bathing: with mod assist;with adaptive equipment;sit to/from stand Pt Will Perform Upper Body Dressing: with min assist;sitting Pt Will Perform Lower Body Dressing: with mod assist;sit to/from stand;with adaptive equipment Pt Will Transfer to Toilet: with min assist;stand pivot transfer;regular height toilet Additional ADL Goal #1: Pt will follow 2 step command 50% of session  Plan      Co-evaluation                 AM-PAC OT "6 Clicks" Daily Activity     Outcome Measure   Help from another person eating meals?: None Help from another person taking care of personal grooming?: None Help from another person toileting, which includes using toliet, bedpan, or urinal?: None Help from another person bathing (including washing, rinsing, drying)?: A Little Help from another person to put on and taking off regular upper body clothing?: None Help from another person to put on and taking off regular lower body clothing?: None 6 Click Score: 23    End of Session Equipment Utilized During Treatment: Gait belt  OT Visit Diagnosis: Unsteadiness on feet (R26.81);Muscle weakness (generalized) (M62.81)   Activity Tolerance Patient tolerated treatment well   Patient Left in bed;with call bell/phone within reach   Nurse Communication Mobility status        Time: 1610-9604 OT Time Calculation (min): 15 min  Charges: OT General Charges $OT Visit: 1 Visit OT Treatments $Therapeutic Activity: 8-22 mins  Ricky Werner, OTR/L Acute Rehab Services Office:  [phone]   Annabella Barr 04/07/2024, 9:57 AM

## 2024-04-07 NOTE — Progress Notes (Signed)
 Discharge instructions (including medications) discussed with and copy provided to patient/caregiver

## 2024-04-07 NOTE — Plan of Care (Signed)
  Problem: Education: Goal: Knowledge of General Education information will improve Description: Including pain rating scale, medication(s)/side effects and non-pharmacologic comfort measures Outcome: Adequate for Discharge   Problem: Health Behavior/Discharge Planning: Goal: Ability to manage health-related needs will improve Outcome: Adequate for Discharge   Problem: Clinical Measurements: Goal: Ability to maintain clinical measurements within normal limits will improve Outcome: Adequate for Discharge Goal: Will remain free from infection Outcome: Adequate for Discharge Goal: Diagnostic test results will improve Outcome: Adequate for Discharge Goal: Respiratory complications will improve Outcome: Adequate for Discharge Goal: Cardiovascular complication will be avoided Outcome: Adequate for Discharge   Problem: Activity: Goal: Risk for activity intolerance will decrease Outcome: Adequate for Discharge   Problem: Nutrition: Goal: Adequate nutrition will be maintained Outcome: Adequate for Discharge   Problem: Coping: Goal: Level of anxiety will decrease Outcome: Adequate for Discharge   Problem: Elimination: Goal: Will not experience complications related to bowel motility Outcome: Adequate for Discharge Goal: Will not experience complications related to urinary retention Outcome: Adequate for Discharge   Problem: Pain Managment: Goal: General experience of comfort will improve and/or be controlled Outcome: Adequate for Discharge   Problem: Safety: Goal: Ability to remain free from injury will improve Outcome: Adequate for Discharge   Problem: Skin Integrity: Goal: Risk for impaired skin integrity will decrease Outcome: Adequate for Discharge   Problem: Activity: Goal: Ability to tolerate increased activity will improve Outcome: Adequate for Discharge   Problem: Respiratory: Goal: Ability to maintain a clear airway and adequate ventilation will  improve Outcome: Adequate for Discharge   Problem: Role Relationship: Goal: Method of communication will improve Outcome: Adequate for Discharge   Problem: Safety: Goal: Non-violent Restraint(s) Outcome: Adequate for Discharge   Problem: Inadequate Intake (NI-2.1) Goal: Food and/or nutrient delivery Description: Individualized approach for food/nutrient provision. Outcome: Adequate for Discharge   Problem: SLP Dysphagia Goals Goal: Patient will demonstrate readiness for PO's Description: Patient will demonstrate readiness for PO's and/or instrumental swallow study as evidenced by: Outcome: Adequate for Discharge   Problem: Acute Rehab OT Goals (only OT should resolve) Goal: Pt. Will Perform Upper Body Bathing Outcome: Adequate for Discharge Goal: Pt. Will Perform Lower Body Bathing Outcome: Adequate for Discharge Goal: Pt. Will Perform Upper Body Dressing Outcome: Adequate for Discharge Goal: Pt. Will Perform Lower Body Dressing Outcome: Adequate for Discharge Goal: Pt. Will Transfer To Toilet Outcome: Adequate for Discharge Goal: OT Additional ADL Goal #1 Outcome: Adequate for Discharge

## 2024-04-07 NOTE — TOC Transition Note (Signed)
 Transition of Care [facility]) - Discharge Note   Patient Details  Name: Ricky Werner MRN: [id number] Date of Birth: 1966-09-06  Transition of [facility]) CM/SW Contact:  Eusebio High, RN Phone Number: 04/07/2024, 10:03 AM   Clinical Narrative:    Patient will DC to home today. Outpatient PT has been referred to [facility] location. Family will transport to home  No additional TOC needs      Barriers to Discharge: Continued Medical Work up, English as a second language teacher   Patient Goals and CMS Choice            Discharge Placement                       Discharge Plan and Services Additional resources added to the After Visit Summary for   In-house Referral: Clinical Social Work                                   Social Drivers of Health (SDOH) Interventions SDOH Screenings   Tobacco Use: High Risk (04/01/2024)     Readmission Risk Interventions     No data to display

## 2024-04-07 NOTE — Discharge Summary (Signed)
 PATIENT DETAILS Name: Ricky Werner Age: 58 y.o. Sex: male Date of Birth: 1966-06-07 MRN: [id number]. Admitting Physician: Claven Cumming, MD NWG:[id number], Salena Craven, MD  Admit Date: 03/25/2024 Discharge date: 04/07/2024  Recommendations for Outpatient Follow-up:  Follow up with PCP in 1-2 weeks Please obtain CMP/CBC in one week Continue counseling regarding  importance of avoiding drug use (see below) Slowly titrate down benzodiazepine use Slowly titrate down Seroquel .  Admitted From:  Home  Disposition: Home with outpatient physical therapy.   Discharge Condition: good  CODE STATUS:   Code Status: Full Code   Diet recommendation:  Diet Order             Diet - low sodium heart healthy           DIET DYS 3 Room service appropriate? Yes with Assist; Fluid consistency: Thin  Diet effective now                    Brief Summary: Patient is a 58 y.o.  male with history of EtOH use, seizure disorder, anxiety/depression-presented to [facility] with encephalopathy-he was essentially unresponsive-he was intubated and transferred to [facility] ICU.  He was evaluated by neurology-started on Keppra -underwent EEG/neuroimaging-he was thought to have toxic encephalopathy due to polysubstance abuse-focal seizures with Todd's paresis.  He was stabilized-subsequently extubated-and transferred to [facility].  See below for further details.   Significant events: 4/30>> admitted w/ acute encephalopathy intubated 5/2>> near arrest after being repositioned. Brady w pauses, hypoxemic, biting tube.  5/5>> extubated  5/6>> agitated remains on precedex  and HFNC  5/7>> high flow nasal cannula was titrated of HFNC 5/7>> Cortrak tube placed 5/9>> transferred to [facility]-pulled out NG tube.   Significant studies: 4/30>> CT head: No acute intracranial abnormality  4/30>> CT C-spine: No fracture/subluxation. 4/30>> CT chest/abdomen/pelvis: No traumatic injury-findings consistent with aspiration or  bronchopneumonia. 5/01>> MRI brain:+ve symmetric restricted diffusion in the bilateral globus pallidus/deep portions of the cerebellum-differentials include toxic exposure/opioid neurotoxicity/anoxic injury. 5/01>> MRI brain: No stenosis 5/01>> echo: EF 55-60%, grade 1 diastolic dysfunction. 5/01-5/02>> LTM EEG: No seizures. 5/04>> MRI C-spine: No stenosis/compression   Significant microbiology data: 4/30>> COVID/influenza/RSV PCR: Negative 4/30>> blood culture 1/2:+ve ACTINOMYCES NAESLUNDII  5/01>> tracheal aspirate: No growth 5/05>> blood culture: No growth   Procedures: See above   Consults: Neurology PCCM  Brief Hospital Course: Acute hypoxic and hypercarbic respiratory failure-in the setting of severe toxic encephalopathy/aspiration pneumonia Intubated on initial presentation-extubated on 5/5-currently on room air   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 Encephalopathy has resolved with supportive care   Probable focal seizures likely related to alcohol  withdrawal/missed benzodiazepine dosage with postictal Todd's paresis Followed closely by neurology-neuroimaging suggestive of opioid overdose related changes LTM EEG negative No further seizures-he is now awake/alert Continue Keppra  Continue Xanax -PCP to consider slow taper to discontinue   EtOH abuse with suspicion for alcohol  withdrawal seizures Per prior notes-patient stopped drinking several days prior to this hospitalization Will finish phenobarb taper 5/.   Delirium-likely related to alcohol  withdrawal/ICU delirium Mental status improved-awake/alert/cooperative. Seroquel  dosage decreased to 50 mg twice daily-further tapering can be done by PCP in the outpatient setting.   Mood disorder Lexapro Precious Broccoli PCP to consider titrating down benzos-with him to eventually discontinue.  Dose of Xanax  changed to 1 mg 3 times daily (on 2 mg prior to this hospitalization)   Chronic benzodiazepine  use Scheduled Xanax -slow outpatient taper per PCP if possible.   Aspiration pneumonia Actinomyces bacteremia Completed 7 days Unasyn  Repeat blood cultures negative  HTN BP stable-but creeping up-continue clonidine -restart low-dose amlodipine  on discharge Losartan remains on hold-PCP to reassess.     Exaggerated vagal tone with sinus bradycardia/frequent sinus pauses with suction or changing position Telemetry monitoring Echo stable EF.   Chronic HFpEF Euvolemic   Oropharyngeal dysphagia Likely due to encephalopathy/debility/deconditioning Patient pulled out NG tube on 5/9-seems to be tolerating diet. Appreciate SLP follow-up.   Debility/deconditioning Secondary to critical illness PT/OT eval-SNF recommended initially-however over the past several days-remarkable improvement-latest recommendations are for outpatient physical therapy.   Polysubstance abuse EtOH/benzos/cocaine/amphetamine-suspicion for opiates although UDS negative (tox screen may not pick up fentanyl ) Has been counseled extensively over this hospitalization.  He understands the life-threatening/life disabling risk of illicit substance use-and the risk of polypharmacy with his medications.   Nutrition Status: Nutrition Problem: Inadequate oral intake Etiology: social / environmental circumstances (EtOH abuse) Signs/Symptoms: per patient/family report Interventions: Ensure Enlive (each supplement provides 350kcal and 20 grams of protein), Magic cup  Discharge Diagnoses:  Principal Problem:   Unresponsive Active Problems:   Acute encephalopathy   Acute respiratory failure with hypoxia (HCC)   AKI (acute kidney injury) (HCC)   Multifocal pneumonia   Discharge Instructions:  Activity:  As tolerated with Full fall precautions use walker/cane & assistance as needed  Discharge Instructions     Call MD for:  difficulty breathing, headache or visual disturbances   Complete by: As directed    Call MD for:   extreme fatigue   Complete by: As directed    Call MD for:  persistant dizziness or light-headedness   Complete by: As directed    Call MD for:  persistant nausea and vomiting   Complete by: As directed    Call MD for:  severe uncontrolled pain   Complete by: As directed    Diet - low sodium heart healthy   Complete by: As directed    Discharge instructions   Complete by: As directed    Follow with Primary MD  [name], MD in 1-2 weeks  Please get a complete blood count and chemistry panel checked by your Primary MD at your next visit, and again as instructed by your Primary MD.  Get Medicines reviewed and adjusted: Please take all your medications with you for your next visit with your Primary MD  Laboratory/radiological data: Please request your Primary MD to go over all hospital tests and procedure/radiological results at the follow up, please ask your Primary MD to get all Hospital records sent to his/her office.  In some cases, they will be blood work, cultures and biopsy results pending at the time of your discharge. Please request that your primary care M.D. follows up on these results.  Also Note the following: If you experience worsening of your admission symptoms, develop shortness of breath, life threatening emergency, suicidal or homicidal thoughts you must seek medical attention immediately by calling 911 or calling your MD immediately  if symptoms less severe.  You must read complete instructions/literature along with all the possible adverse reactions/side effects for all the Medicines you take and that have been prescribed to you. Take any new Medicines after you have completely understood and accpet all the possible adverse reactions/side effects.   Do not drive when taking Pain medications or sleeping medications (Benzodaizepines)  Do not take more than prescribed Pain, Sleep and Anxiety Medications. It is not advisable to combine anxiety,sleep and pain  medications without talking with your primary care practitioner  Special Instructions: If you have smoked or chewed  Tobacco  in the last 2 yrs please stop smoking, stop any regular Alcohol   and or any Recreational drug use.  Wear Seat belts while driving.  Please note: You were cared for by a hospitalist during your hospital stay. Once you are discharged, your primary care physician will handle any further medical issues. Please note that NO REFILLS for any discharge medications will be authorized once you are discharged, as it is imperative that you return to your primary care physician (or establish a relationship with a primary care physician if you do not have one) for your post hospital discharge needs so that they can reassess your need for medications and monitor your lab values.   Increase activity slowly   Complete by: As directed       Allergies as of 04/07/2024   No Known Allergies      Medication List     STOP taking these medications    olmesartan  20 MG tablet Commonly known as: Benicar        TAKE these medications    acetaminophen -codeine  300-30 MG tablet Commonly known as: TYLENOL  #3 Take 1 tablet by mouth every 4 (four) hours as needed for moderate pain (pain score 4-6) or severe pain (pain score 7-10). For up to five days   alprazolam  2 MG tablet Commonly known as: XANAX  Take 0.5 tablets (1 mg total) by mouth 3 (three) times daily. What changed: how much to take   amLODipine  5 MG tablet Commonly known as: NORVASC  Take 1 tablet (5 mg total) by mouth daily. What changed:  medication strength how much to take   aspirin  EC 81 MG tablet Take 81 mg by mouth daily.   CLEAR EYES COMPLETE OP Place 1 drop into both eyes daily as needed (dry eyes/irritation).   cloNIDine  0.1 MG tablet Commonly known as: CATAPRES  Take 1 tablet (0.1 mg total) by mouth 3 (three) times daily. What changed:  medication strength how much to take   cyanocobalamin  1000 MCG  tablet Take 1 tablet (1,000 mcg total) by mouth daily. Start taking on: Apr 08, 2024   escitalopram  10 MG tablet Commonly known as: LEXAPRO  Take 1 tablet by mouth daily.   folic acid  1 MG tablet Commonly known as: FOLVITE  Take 1 tablet (1 mg total) by mouth daily. Start taking on: Apr 08, 2024   levETIRAcetam  1000 MG tablet Commonly known as: KEPPRA  Take 1 tablet (1,000 mg total) by mouth 2 (two) times daily.   methocarbamol  500 MG tablet Commonly known as: ROBAXIN  Take 1 tablet (500 mg total) by mouth every 8 (eight) hours as needed for muscle spasms.   multivitamin with minerals Tabs tablet Take 1 tablet by mouth daily. Start taking on: Apr 08, 2024   naproxen  500 MG tablet Commonly known as: NAPROSYN  Take 1 tablet (500 mg total) by mouth 2 (two) times daily.   QUEtiapine  50 MG tablet Commonly known as: SEROQUEL  Take 1 tablet (50 mg total) by mouth 2 (two) times daily.        Follow-up Information     [name], MD. Schedule an appointment as soon as possible for a visit in 1 week(s).   Specialty: Internal Medicine Contact information: [address] [place] [address] [phone]                No Known Allergies   Other Procedures/Studies: DG CHEST PORT 1 VIEW Result Date: 03/31/2024 CLINICAL DATA:  200808 Hypoxia [id number] EXAM: PORTABLE CHEST - 1 VIEW COMPARISON:  Mar 27, 2024 FINDINGS: Elevation of the right hemidiaphragm. Improved aeration in the right lung base. Minimal persistent bibasilar airspace opacities, possibly reflecting atelectasis. No focal airspace consolidation, pleural effusion, or pneumothorax. Mild cardiomegaly. Interval removal of the endotracheal and esophagogastric tubes. No acute fracture or destructive lesions. Multilevel thoracic osteophytosis. IMPRESSION: 1. Improved aeration of the right lung base with mild persistent bibasilar airspace opacities, possibly reflecting atelectasis. 2. Interval removal of the  endotracheal and esophagogastric tubes. Electronically Signed   By: Rance Burrows M.D.   On: 03/31/2024 13:33   MR CERVICAL SPINE W WO CONTRAST Result Date: 03/30/2024 CLINICAL DATA:  Demyelinating disease. EXAM: MRI CERVICAL SPINE WITHOUT AND WITH CONTRAST TECHNIQUE: Multiplanar and multiecho pulse sequences of the cervical spine, to include the craniocervical junction and cervicothoracic junction, were obtained without and with intravenous contrast. CONTRAST:  9mL GADAVIST  GADOBUTROL  1 MMOL/ML IV SOLN COMPARISON:  None Available. FINDINGS: Alignment: No substantial sagittal subluxation. Vertebrae: No fracture, evidence of discitis, or bone lesion. Cord: Normal cord signal.  No abnormal enhancement. Posterior Fossa, vertebral arteries, paraspinal tissues: Negative. Disc levels: Mild bilateral foraminal stenosis at C4-C5 and C5-C6 due to facet and uncovertebral hypertrophy. No significant canal stenosis. IMPRESSION: 1. Normal cord signal. 2. Mild bilateral foraminal stenosis C4-C5 and C5-C6. Electronically Signed   By: Stevenson Elbe M.D.   On: 03/30/2024 01:55   DG CHEST PORT 1 VIEW Result Date: 03/27/2024 CLINICAL DATA:  Hypoxemia. EXAM: PORTABLE CHEST 1 VIEW COMPARISON:  03/25/2024 FINDINGS: Endotracheal tube tip is 3.7 cm above the base of the carina. The NG tube passes into the stomach although the distal tip position is not included on the film. Bibasilar collapse/consolidation again noted, mildly progressive on the right and similar on the left. Probable small bilateral pleural effusions. Telemetry leads overlie the chest. IMPRESSION: 1. Bibasilar collapse/consolidation, mildly progressive on the right and similar on the left. 2. Probable small bilateral pleural effusions. Electronically Signed   By: Donnal Fusi M.D.   On: 03/27/2024 09:26   Overnight EEG with video Result Date: 03/27/2024 Arleene Lack, MD     03/28/2024  7:34 AM Patient Name: Ricky Werner MRN: [id number] Epilepsy Attending:  Arleene Lack Referring Physician/Provider: Tona Francis, MD Duration: 03/26/2024 0901 to 03/27/2024 1015 Patient history: 58yo M with left-sided weakness. EEG to evaluate for seizure Level of alertness: Awake, asleep AEDs during EEG study: Xanax , LEV Technical aspects: This EEG study was done with scalp electrodes positioned according to the 10-20 International system of electrode placement. Electrical activity was reviewed with band pass filter of 1-70Hz , sensitivity of 7 uV/mm, display speed of 22mm/sec with a 60Hz  notched filter applied as appropriate. EEG data were recorded continuously and digitally stored.  Video monitoring was available and reviewed as appropriate. Description: The posterior dominant rhythm consists of 9-10 Hz activity of moderate voltage (25-35 uV) seen predominantly in posterior head regions, symmetric and reactive to eye opening and eye closing. Sleep was characterized by vertex waves, sleep spindles (12 to 14 Hz), maximal frontocentral region. EEG showed continuous generalized 3 to 6 Hz theta-delta slowing admixed with 12-15hz  beta activity distributed symmetrically and diffusely. Hyperventilation and photic stimulation were not performed.  Event button was pressed on 03/27/2024 at 0836 biting tube, hypoxic and bradycardic in 30s. Concomitant eeg before, during and after the event didn't show any eeg change to suggest seizure EEG was disconnected between 03/26/2024  0944 to 1120. ABNORMALITY - Continuous slow, generalized IMPRESSION: This study is suggestive of moderate diffuse encephalopathy. No  seizures or epileptiform discharges were seen throughout the recording. Event button was pressed on 03/27/2024 at 0836 biting tube, hypoxic and bradycardic in 30s without concomitant eeg change. This was not an epileptic event. Arleene Lack   ECHOCARDIOGRAM COMPLETE Result Date: 03/26/2024    ECHOCARDIOGRAM REPORT   Patient Name:   Ricky Werner Date of Exam: 03/26/2024 Medical Rec #:  [id number]        Height:       66.0 in Accession #:    [id number]      Weight:       188.7 lb Date of Birth:  [birth year]       BSA:          1.951 m Patient Age:    58 years        BP:           75/58 mmHg Patient Gender: M               HR:           69 bpm. Exam Location:  Inpatient Procedure: 2D Echo, Cardiac Doppler and Color Doppler (Both Spectral and Color            Flow Doppler were utilized during procedure). Indications:    Cardiac arrest  History:        Patient has prior history of Echocardiogram examinations, most                 recent 04/05/2023. Risk Factors:Hypertension.  Sonographer:    Juanita Shaw Referring Phys: [id number] Arzella Bitters [name] IMPRESSIONS  1. Left ventricular ejection fraction, by estimation, is 55 to 60%. The left ventricle has normal function. The left ventricle has no regional wall motion abnormalities. Left ventricular diastolic parameters are consistent with Grade I diastolic dysfunction (impaired relaxation).  2. Right ventricular systolic function is mildly reduced. The right ventricular size is mildly enlarged. Tricuspid regurgitation signal is inadequate for assessing PA pressure.  3. The mitral valve is normal in structure. No evidence of mitral valve regurgitation. No evidence of mitral stenosis.  4. The aortic valve is tricuspid. There is mild calcification of the aortic valve. Aortic valve regurgitation is not visualized. No aortic stenosis is present.  5. The inferior vena cava is dilated in size with <50% respiratory variability, suggesting right atrial pressure of 15 mmHg. FINDINGS  Left Ventricle: Left ventricular ejection fraction, by estimation, is 55 to 60%. The left ventricle has normal function. The left ventricle has no regional wall motion abnormalities. The left ventricular internal cavity size was normal in size. There is  no left ventricular hypertrophy. Left ventricular diastolic parameters are consistent with Grade I diastolic dysfunction (impaired relaxation). Right  Ventricle: The right ventricular size is mildly enlarged. No increase in right ventricular wall thickness. Right ventricular systolic function is mildly reduced. Tricuspid regurgitation signal is inadequate for assessing PA pressure. Left Atrium: Left atrial size was normal in size. Right Atrium: Right atrial size was normal in size. Pericardium: There is no evidence of pericardial effusion. Mitral Valve: The mitral valve is normal in structure. Mild mitral annular calcification. No evidence of mitral valve regurgitation. No evidence of mitral valve stenosis. MV peak gradient, 1.0 mmHg. The mean mitral valve gradient is 0.0 mmHg. Tricuspid Valve: The tricuspid valve is normal in structure. Tricuspid valve regurgitation is not demonstrated. Aortic Valve: The aortic valve is tricuspid. There is mild calcification of the aortic valve. Aortic valve regurgitation is not visualized. No aortic stenosis  is present. Aortic valve mean gradient measures 3.0 mmHg. Aortic valve peak gradient measures 5.4 mmHg. Aortic valve area, by VTI measures 3.87 cm. Pulmonic Valve: The pulmonic valve was normal in structure. Pulmonic valve regurgitation is not visualized. Aorta: The aortic root is normal in size and structure. Venous: The inferior vena cava is dilated in size with less than 50% respiratory variability, suggesting right atrial pressure of 15 mmHg. IAS/Shunts: No atrial level shunt detected by color flow Doppler.  LEFT VENTRICLE PLAX 2D LVIDd:         4.60 cm     Diastology LVIDs:         2.80 cm     LV e' medial:    6.85 cm/s LV PW:         0.90 cm     LV E/e' medial:  6.2 LV IVS:        0.90 cm     LV e' lateral:   11.20 cm/s LVOT diam:     2.10 cm     LV E/e' lateral: 3.8 LV SV:         66 LV SV Index:   34 LVOT Area:     3.46 cm  LV Volumes (MOD) LV vol d, MOD A2C: 90.4 ml LV vol d, MOD A4C: 93.0 ml LV vol s, MOD A2C: 39.4 ml LV vol s, MOD A4C: 35.4 ml LV SV MOD A2C:     51.0 ml LV SV MOD A4C:     93.0 ml LV SV MOD BP:       53.4 ml RIGHT VENTRICLE            IVC RV Basal diam:  4.20 cm    IVC diam: 2.20 cm RV Mid diam:    3.30 cm RV S prime:     7.29 cm/s TAPSE (M-mode): 1.6 cm LEFT ATRIUM             Index        RIGHT ATRIUM           Index LA diam:        2.50 cm 1.28 cm/m   RA Area:     11.60 cm LA Vol (A2C):   30.7 ml 15.74 ml/m  RA Volume:   27.20 ml  13.94 ml/m LA Vol (A4C):   13.0 ml 6.66 ml/m LA Biplane Vol: 21.4 ml 10.97 ml/m  AORTIC VALVE                    PULMONIC VALVE AV Area (Vmax):    2.66 cm     PV Vmax:       0.76 m/s AV Area (Vmean):   2.88 cm     PV Peak grad:  2.3 mmHg AV Area (VTI):     3.87 cm AV Vmax:           116.00 cm/s AV Vmean:          75.700 cm/s AV VTI:            0.170 m AV Peak Grad:      5.4 mmHg AV Mean Grad:      3.0 mmHg LVOT Vmax:         89.00 cm/s LVOT Vmean:        63.000 cm/s LVOT VTI:          0.190 m LVOT/AV VTI ratio: 1.12  AORTA Ao Root diam: 3.30 cm Ao Asc diam:  3.50  cm MITRAL VALVE MV Area (PHT): 3.63 cm    SHUNTS MV Area VTI:   4.80 cm    Systemic VTI:  0.19 m MV Peak grad:  1.0 mmHg    Systemic Diam: 2.10 cm MV Mean grad:  0.0 mmHg MV Vmax:       0.51 m/s MV Vmean:      27.8 cm/s MV Decel Time: 209 msec MV E velocity: 42.30 cm/s MV A velocity: 43.10 cm/s MV E/A ratio:  0.98 Dalton McleanMD Electronically signed by Archer Bear Signature Date/Time: 03/26/2024/2:06:19 PM    Final    MR BRAIN WO CONTRAST Result Date: 03/26/2024 CLINICAL DATA:  58 year old male found unresponsive. EXAM: MRI HEAD WITHOUT CONTRAST TECHNIQUE: Multiplanar, multiecho pulse sequences of the brain and surrounding structures were obtained without intravenous contrast. COMPARISON:  Intracranial MRA today reported separately. Head CT yesterday. Brain MRI 04/19/2023. FINDINGS: Brain: Symmetric small foci of restricted diffusion in the bilateral globus pallidus (series 2, image 25). And larger but patchy and symmetric restricted diffusion in the bilateral central cerebellum on series 2, image 13.  Mild asymmetric left SCA territory. A compared to the MRI last year, the bilateral deep gray nuclei appear heterogeneous on FLAIR, with suspected abnormal symmetric medial thalamic signal on series 7, image 20. Furthermore, there is suspicion of exaggerated T2 hyperintensity throughout the bilateral cerebellar folia (series 6, image 6). However, no obvious cerebral cortical edema or enlargement, no definite cortical restriction. Brainstem seems to remain normal. No midline shift, mass effect, evidence of mass lesion, ventriculomegaly, extra-axial collection or acute intracranial hemorrhage. Cervicomedullary junction and pituitary are within normal limits. No chronic cerebral blood/that no chronic cerebral blood products. Stable mild chronic T2 hyperintensity in the right corona radiata. Left cerebellum developmental venous anomaly, normal variant. Vascular: Major intracranial vascular flow voids are preserved. MRA reported separately. Skull and upper cervical spine: Negative. Visualized bone marrow signal is within normal limits. Sinuses/Orbits: Intubated, fluid in the pharynx. Only mild paranasal sinus opacification. Negative orbits. Other: Mastoids well aerated. Visible internal auditory structures appear normal. IMPRESSION: 1. Positive for Symmetric restricted diffusion in the bilateral globus pallidus, and deep portions of the cerebellum. Questionable also subtle abnormal signal also in both medial thalami, and generally involving the bilateral cerebellar folia. Differential considerations include Toxic exposures, opioid neurotoxicity (CHANTER), Anoxic injury. 2. No intracranial mass effect. And elsewhere stable MRI appearance of the brain since last year. Electronically Signed   By: Marlise Simpers M.D.   On: 03/26/2024 11:51   MR ANGIO HEAD WO CONTRAST Result Date: 03/26/2024 CLINICAL DATA:  58 year old male found unresponsive. EXAM: MRA HEAD WITHOUT CONTRAST TECHNIQUE: Angiographic images of the Circle of Willis  were acquired using MRA technique without intravenous contrast. COMPARISON:  Brain MRI today reported separately. FINDINGS: Anterior circulation: Antegrade flow in the ICA siphons, tortuous right ICA just below the skull base. No siphon stenosis. Normal posterior communicating artery, fell mic artery origins. Patent carotid termini. Patent MCA and ACA origins. Tortuous A1 segments, the left is mildly irregular. Normal anterior communicating artery. Tortuous ACA A2 segments but otherwise negative visible bilateral ACA branches. Tortuous MCA M1 segments. Patent MCA bi/trifurcations. Visible bilateral MCA branches are within normal limits. Posterior circulation: Antegrade flow in the posterior circulation with dominant appearing distal left vertebral artery supplying the basilar. Distal right vertebral terminates in PICA. Left PICA origin is patent. No distal vertebral or basilar stenosis. Basilar terminates at the SCA origins which remain patent. Fetal bilateral PCA origins with mildly tortuous posterior communicating  arteries. Bilateral PCA branches are within normal limits. Anatomic variants: Dominant left vertebral supplies the basilar which terminates at the SCAs. Distal right vertebral terminates in PICA. Fetal PCA origins. Other: Brain MRI today reported separately. IMPRESSION: Negative intracranial MRA; some generalized arterial tortuosity with no arterial stenosis. Electronically Signed   By: Marlise Simpers M.D.   On: 03/26/2024 11:17   DG Chest Port 1 View Result Date: 03/25/2024 CLINICAL DATA:  Hypoxemia, respiratory failure, intubated EXAM: PORTABLE CHEST 1 VIEW COMPARISON:  03/25/2024 FINDINGS: Single frontal view of the chest demonstrates endotracheal tube overlying tracheal air column, tip 2 cm above carina. Enteric catheter passes below diaphragm, tip projecting over gastric body. Cardiac silhouette is unremarkable. Bibasilar consolidation has progressed since earlier CT. Small bilateral pleural effusions  are suspected. No pneumothorax. No acute bony abnormalities. IMPRESSION: 1. Support devices as above. 2. Progressive bibasilar consolidation, with small bilateral pleural effusions. Electronically Signed   By: Bobbye Burrow M.D.   On: 03/25/2024 23:24   CT Cervical Spine Wo Contrast Result Date: 03/25/2024 CLINICAL DATA:  Neck trauma.  Patient was found unresponsive by son. EXAM: CT CERVICAL SPINE WITHOUT CONTRAST TECHNIQUE: Multidetector CT imaging of the cervical spine was performed without intravenous contrast. Multiplanar CT image reconstructions were also generated. RADIATION DOSE REDUCTION: This exam was performed according to the departmental dose-optimization program which includes automated exposure control, adjustment of the mA and/or kV according to patient size and/or use of iterative reconstruction technique. COMPARISON:  None Available. FINDINGS: Alignment: No significant listhesis is present. Normal cervical lordosis is present. Skull base and vertebrae: The craniocervical junction is normal. The vertebral body heights are normal. Soft tissues and spinal canal: No prevertebral fluid or swelling. No visible canal hematoma. Patient is intubated. Atherosclerotic calcifications are present at the carotid bifurcations bilaterally without significant stenosis. Disc levels: Uncovertebral and facet hypertrophy results an left greater than right foraminal narrowing at C3-4 and C5-6. No significant central canal stenosis is present. Upper chest: The lung apices are clear. The thoracic inlet is within normal limits. IMPRESSION: 1. No acute fracture or traumatic subluxation. 2. Uncovertebral and facet hypertrophy results in left greater than right foraminal narrowing at C3-4 and C5-6. 3. Atherosclerotic calcifications at the carotid bifurcations bilaterally without significant stenosis. Electronically Signed   By: Audree Leas M.D.   On: 03/25/2024 18:34   CT CHEST ABDOMEN PELVIS WO CONTRAST Result  Date: 03/25/2024 CLINICAL DATA:  Blunt polytrauma, found unresponsive by son EXAM: CT CHEST, ABDOMEN AND PELVIS WITHOUT CONTRAST TECHNIQUE: Multidetector CT imaging of the chest, abdomen and pelvis was performed following the standard protocol without IV contrast. RADIATION DOSE REDUCTION: This exam was performed according to the departmental dose-optimization program which includes automated exposure control, adjustment of the mA and/or kV according to patient size and/or use of iterative reconstruction technique. COMPARISON:  Chest radiograph earlier today; CT chest 11/29/2023; CT abdomen pelvis 10/19/2018 FINDINGS: CT CHEST FINDINGS Cardiovascular: No pericardial effusion. Coronary artery and aortic atherosclerotic calcification. Evaluation for vascular injury is limited without IV contrast. Mediastinum/Nodes: Endotracheal tube with tip in the intrathoracic trachea. Unremarkable esophagus. No mediastinal hematoma. Lungs/Pleura: Peribronchovascular infiltrates/atelectasis in the left greater than right lower lobes. Peribronchial wall thickening in the lower lobes. No pleural effusion or pneumothorax. Musculoskeletal: No acute fracture. Chronic superior endplate compression fractures of T10 and T11. CT ABDOMEN PELVIS FINDINGS Hepatobiliary: Cholelithiasis. No evidence of acute cholecystitis. Unremarkable noncontrast appearance of the liver. Pancreas: Unremarkable. Spleen: Unremarkable. Adrenals/Urinary Tract: Normal adrenal glands and kidneys. Unremarkable bladder. Stomach/Bowel: No  bowel obstruction or bowel wall thickening. Stomach and appendix are within normal limits. Vascular/Lymphatic: Aortic atherosclerosis. No enlarged abdominal or pelvic lymph nodes. Reproductive: Unremarkable. Other: No free intraperitoneal fluid or air. Musculoskeletal: No acute fracture. IMPRESSION: 1. No acute traumatic injury in the chest, abdomen, or pelvis. 2. Peribronchovascular infiltrates/atelectasis in the left greater than  right lower lobes. Peribronchial wall thickening in the lower lobes. Findings are favored to represent aspiration and/or bronchopneumonia. 3. Cholelithiasis. 4. Aortic Atherosclerosis (ICD10-I70.0). Electronically Signed   By: Rozell Cornet M.D.   On: 03/25/2024 18:16   CT HEAD WO CONTRAST ( ) Result Date: 03/25/2024 CLINICAL DATA:  Neuro deficit, acute, stroke suspected. Patient found unresponsive on ground by son. EXAM: CT HEAD WITHOUT CONTRAST TECHNIQUE: Contiguous axial images were obtained from the base of the skull through the vertex without intravenous contrast. RADIATION DOSE REDUCTION: This exam was performed according to the departmental dose-optimization program which includes automated exposure control, adjustment of the mA and/or kV according to patient size and/or use of iterative reconstruction technique. COMPARISON:  MR head without contrast 04/19/2023 FINDINGS: Brain: No acute infarct, hemorrhage, or mass lesion is present. Deep brain nuclei are within normal limits. No significant white matter lesions are present. The ventricles are of normal size. No significant extraaxial fluid collection is present. The brainstem and cerebellum are within normal limits. Vascular: Minimal atherosclerotic calcifications are present in the cavernous internal carotid arteries bilaterally. No hyperdense vessel is present. Skull: Calvarium is intact. No focal lytic or blastic lesions are present. No significant extracranial soft tissue lesion is present. Sinuses/Orbits: The paranasal sinuses and mastoid air cells are clear. The globes and orbits are within normal limits. IMPRESSION: Negative CT of the head. No acute or focal lesion to explain the patient's symptoms. Electronically Signed   By: Audree Leas M.D.   On: 03/25/2024 18:02   DG Chest Portable 1 View Result Date: 03/25/2024 CLINICAL DATA:  Intubation EXAM: PORTABLE CHEST 1 VIEW COMPARISON:  08/16/2020 FINDINGS: Endotracheal tube placed with  tip measuring 3.6 cm above the carina. Shallow inspiration. Heart size and pulmonary vascularity are normal. Lungs are clear. No pleural effusion or pneumothorax. Mediastinal contours appear intact. Old fracture deformity of the left clavicle. IMPRESSION: Endotracheal tube appears in satisfactory position. Lungs are clear. Electronically Signed   By: Boyce Byes M.D.   On: 03/25/2024 17:25     TODAY-DAY OF DISCHARGE:  Subjective:   Ricky Werner today has no headache,no chest abdominal pain,no new weakness tingling or numbness, feels much better wants to go home today.   Objective:   Blood pressure (!) 127/90, pulse 95, temperature 97.6 F (36.4 C), temperature source Oral, resp. rate 18, height 5\' 6"  (1.676 m), weight 81.2 kg, SpO2 98%.  Intake/Output Summary (Last 24 hours) at 04/07/2024 0959 Last data filed at 04/07/2024 0725 Gross per 24 hour  Intake 840 ml  Output --  Net 840 ml   Filed Weights   04/04/24 0341 04/06/24 0500 04/07/24 0500  Weight: 87.2 kg 80.3 kg 81.2 kg    Exam: Awake Alert, Oriented *3, No new F.N deficits, Normal affect [place].AT,PERRAL Supple Neck,No JVD, No cervical lymphadenopathy appriciated.  Symmetrical Chest wall movement, Good air movement bilaterally, CTAB RRR,No Gallops,Rubs or new Murmurs, No Parasternal Heave +ve B.Sounds, Abd Soft, Non tender, No organomegaly appriciated, No rebound -guarding or rigidity. No Cyanosis, Clubbing or edema, No new Rash or bruise   PERTINENT RADIOLOGIC STUDIES: No results found.   PERTINENT LAB RESULTS: CBC: Recent Labs  04/05/24 0609  WBC 9.9  HGB 12.5*  HCT 37.7*  PLT 406*   CMET CMP     Component Value Date/Time   NA 142 04/05/2024 0609   K 3.9 04/05/2024 0609   CL 112 (H) 04/05/2024 0609   CO2 22 04/05/2024 0609   GLUCOSE 109 (H) 04/05/2024 0609   BUN 20 04/05/2024 0609   CREATININE 0.79 04/05/2024 0609   CALCIUM  8.9 04/05/2024 0609   PROT 5.0 (L) 03/27/2024 0726   ALBUMIN 2.5 (L)  03/27/2024 0726   AST 33 03/27/2024 0726   ALT 48 (H) 03/27/2024 0726   ALKPHOS 23 (L) 03/27/2024 0726   BILITOT 1.1 03/27/2024 0726   GFRNONAA >60 04/05/2024 0609    GFR Estimated Creatinine Clearance: 98.3 mL/min (by C-G formula based on SCr of 0.79 mg/dL). No results for input(s): "LIPASE", "AMYLASE" in the last 72 hours. No results for input(s): "CKTOTAL", "CKMB", "CKMBINDEX", "TROPONINI" in the last 72 hours. Invalid input(s): "POCBNP" No results for input(s): "DDIMER" in the last 72 hours. No results for input(s): "HGBA1C" in the last 72 hours. No results for input(s): "CHOL", "HDL", "LDLCALC", "TRIG", "CHOLHDL", "LDLDIRECT" in the last 72 hours. No results for input(s): "TSH", "T4TOTAL", "T3FREE", "THYROIDAB" in the last 72 hours.  Invalid input(s): "FREET3" No results for input(s): "VITAMINB12", "FOLATE", "FERRITIN", "TIBC", "IRON", "RETICCTPCT" in the last 72 hours. Coags: No results for input(s): "INR" in the last 72 hours.  Invalid input(s): "PT" Microbiology: Recent Results (from the past 240 hours)  Culture, blood (Routine X 2) w Reflex to ID Panel     Status: None   Collection Time: 03/30/24  2:19 PM   Specimen: BLOOD  Result Value Ref Range Status   Specimen Description BLOOD SITE NOT SPECIFIED  Final   Special Requests   Final    BOTTLES DRAWN AEROBIC AND ANAEROBIC Blood Culture adequate volume   Culture   Final    NO GROWTH 5 DAYS Performed at [facility], [address] [place], [place] [address]    Report Status 04/04/2024 FINAL  Final  Culture, blood (Routine X 2) w Reflex to ID Panel     Status: None   Collection Time: 03/30/24  2:26 PM   Specimen: BLOOD  Result Value Ref Range Status   Specimen Description BLOOD SITE NOT SPECIFIED  Final   Special Requests   Final    BOTTLES DRAWN AEROBIC AND ANAEROBIC Blood Culture adequate volume   Culture   Final    NO GROWTH 5 DAYS Performed at [facility], [address] [place], [place]  [address]    Report Status 04/04/2024 FINAL  Final    FURTHER DISCHARGE INSTRUCTIONS:  Get Medicines reviewed and adjusted: Please take all your medications with you for your next visit with your Primary MD  Laboratory/radiological data: Please request your Primary MD to go over all hospital tests and procedure/radiological results at the follow up, please ask your Primary MD to get all Hospital records sent to his/her office.  In some cases, they will be blood work, cultures and biopsy results pending at the time of your discharge. Please request that your primary care M.D. goes through all the records of your hospital data and follows up on these results.  Also Note the following: If you experience worsening of your admission symptoms, develop shortness of breath, life threatening emergency, suicidal or homicidal thoughts you must seek medical attention immediately by calling 911 or calling your MD immediately  if  symptoms less severe.  You must read complete instructions/literature along with all the possible adverse reactions/side effects for all the Medicines you take and that have been prescribed to you. Take any new Medicines after you have completely understood and accpet all the possible adverse reactions/side effects.   Do not drive when taking Pain medications or sleeping medications (Benzodaizepines)  Do not take more than prescribed Pain, Sleep and Anxiety Medications. It is not advisable to combine anxiety,sleep and pain medications without talking with your primary care practitioner  Special Instructions: If you have smoked or chewed Tobacco  in the last 2 yrs please stop smoking, stop any regular Alcohol   and or any Recreational drug use.  Wear Seat belts while driving.  Please note: You were cared for by a hospitalist during your hospital stay. Once you are discharged, your primary care physician will handle any further medical issues. Please note that NO REFILLS for any  discharge medications will be authorized once you are discharged, as it is imperative that you return to your primary care physician (or establish a relationship with a primary care physician if you do not have one) for your post hospital discharge needs so that they can reassess your need for medications and monitor your lab values.  Total Time spent coordinating discharge including counseling, education and face to face time equals greater than 30 minutes.  SignedKimberly Penna 04/07/2024 9:59 AM

## 2024-04-14 ENCOUNTER — Telehealth (INDEPENDENT_AMBULATORY_CARE_PROVIDER_SITE_OTHER): Payer: Self-pay | Admitting: Gastroenterology

## 2024-04-14 NOTE — Telephone Encounter (Signed)
 Pt has been in hospital for 13 days at this time. Pt was seen on 03/11/24 and we needed to schedule colonoscopy for pt. Will pt need to come in for an office visit after being discharged? Please advise. Thank you

## 2024-04-15 NOTE — Telephone Encounter (Signed)
Please see below message. Thank you

## 2024-04-23 ENCOUNTER — Institutional Professional Consult (permissible substitution) (INDEPENDENT_AMBULATORY_CARE_PROVIDER_SITE_OTHER): Admitting: Otolaryngology

## 2024-05-01 ENCOUNTER — Ambulatory Visit: Payer: Medicaid Other | Admitting: Neurology

## 2024-05-13 ENCOUNTER — Ambulatory Visit: Admitting: Gastroenterology

## 2024-05-14 ENCOUNTER — Encounter: Payer: Self-pay | Admitting: Gastroenterology

## 2024-05-15 ENCOUNTER — Ambulatory Visit: Payer: Medicaid Other | Admitting: Neurology

## 2024-05-22 ENCOUNTER — Telehealth: Payer: Self-pay

## 2024-05-22 ENCOUNTER — Telehealth: Payer: Self-pay | Admitting: Pharmacy Technician

## 2024-05-22 ENCOUNTER — Other Ambulatory Visit (HOSPITAL_COMMUNITY): Payer: Self-pay

## 2024-05-22 NOTE — Telephone Encounter (Signed)
 Pt needs a PA for Levetiracetam 

## 2024-05-22 NOTE — Telephone Encounter (Signed)
 Pharmacy Patient Advocate Encounter   Received notification from Pt Calls Messages that prior authorization for LEVETIRACETAM  1000MG  is required/requested.   Insurance verification completed.   The patient is insured through [facility] .   Per test claim: PA required; PA submitted to above mentioned insurance via CoverMyMeds Key/confirmation #/EOC [id number] Status is pending

## 2024-05-22 NOTE — Telephone Encounter (Signed)
 PA has been submitted, and telephone encounter has been created. Please see telephone encounter dated 6.27.25.

## 2024-05-24 NOTE — Progress Notes (Deleted)
 Ricky Werner, male    DOB: 05-02-1966    MRN: [id number]   Brief patient profile:  76 yowm  active smoker  referred to pulmonary clinic in [place]  07/25/2023 by Dr Bertell  for progressive doe x 2021.    History of Present Illness  07/25/2023  Pulmonary/ 1st office eval/ Ricky Werner / [facility]  Chief Complaint  Patient presents with   Establish Care   Shortness of Breath  Dyspnea:  maybe one or two aisles and out of breath.  Cough: dry raspy with choking sensation  Sleep: on couch with a pillow due to vertigo but can't sleep due to choking spells  SABA use: never used  02: none  Lung cancer screen:  referred  Rec Stop  lisinopril  and start olmesartan  20 mg  twice daily - take only once daily if too strong  Please schedule a follow up office visit in 6 weeks, call sooner if needed     12/13/2023  f/u ov/[place] office/Ricky Werner Werner: ? Ace case /emphysema on LDSCT  maint on no resp meds   Chief Complaint  Patient presents with   Follow-up    Breathing is unchanged. Denies new co's.   Dyspnea:  no better =  50 ft (not reproduced in clinic) but bothered also by severe vertigo and tinnitis  Cough: raspy assoc with hoarseness and nasal obst bilaterally x years p broke nose s ent eval yet  Sleeping: flat and no longer having choking spells or    resp cc  SABA use: none  02: none  Lung cancer screening: 11/29/23 rec Proceed with CPST  when available  My office will be contacting you by phone for referral to [facility] and PFTs at [facility]   If we see a problem on your PFTs or CPST indicating a pulmonary issue, we will schedule you back here   If you stop smoking now, your lungs should be fine and we'll see you back as needed   Late add: - PFT's  01/22/23  FEV1 2.70 (85 % ) ratio 0.74  p 0 % improvement from saba p 0 prior to study with DLCO  19.7 (80%)   and FV curve abnormal insp> exp  suggesting upper airway obstruction with no expiratory issues so not suggestive of a lung  problem > ent eval next step    02/19/2024  f/u ov/[place] office/Ricky Werner: doe/ nasal obstruction/cough    maint on no resp rx/ still smoking  Chief Complaint  Patient presents with   Follow-up    Follow up for DOE , has gotten worse b/c of pollen   Dyspnea:  mainly due to cough  Cough: mildly congested sounding / no purulent sputum / much worse since pollen started up  Sleeping: on couch arm x  one pillow freq awakening from cough    resp cc  SABA use: none  02: none  Rec    05/25/2024  f/u ov/[place] office/Ricky Werner: *** maint on ***  No chief complaint on file.   Dyspnea:  *** Cough: *** Sleeping: ***   resp cc  SABA use: *** 02: ***  Lung cancer screening: 11/29/23 RADS 1    No obvious day to day or daytime variability or assoc excess/ purulent sputum or mucus plugs or hemoptysis or cp or chest tightness, subjective wheeze or overt sinus or hb symptoms.    Also denies any obvious fluctuation of symptoms with weather or environmental changes or other aggravating or alleviating  factors except as outlined above   No unusual exposure hx or h/o childhood pna/ asthma or knowledge of premature birth.  Current Allergies, Complete Past Medical History, Past Surgical History, Family History, and Social History were reviewed in [name].  ROS  The following are not active complaints unless bolded Hoarseness, sore throat, dysphagia, dental problems, itching, sneezing,  nasal congestion or discharge of excess mucus or purulent secretions, ear ache,   fever, chills, sweats, unintended wt loss or wt gain, classically pleuritic or exertional cp,  orthopnea pnd or arm/hand swelling  or leg swelling, presyncope, palpitations, abdominal pain, anorexia, nausea, vomiting, diarrhea  or change in bowel habits or change in bladder habits, change in stools or change in urine, dysuria, hematuria,  rash, arthralgias, visual complaints, headache, numbness, weakness or  ataxia or problems with walking or coordination,  change in mood or  memory.        No outpatient medications have been marked as taking for the 05/25/24 encounter (Appointment) with Darlean Ozell NOVAK, MD.                Past Medical History:  Diagnosis Date   Alcohol  abuse    Anxiety    BPH (benign prostatic hyperplasia)    Carpal tunnel syndrome on right    Chronic back pain    Depression    Headache    History of dental problems    Hypertension    Osteoarthritis    Panic attacks    Right tennis elbow    Sciatica    Seizures (HCC) 09/30/2020   Vertigo       Objective:    Wts  05/25/2024          ***  02/19/2024        180  12/13/23 177 lb 3.2 oz (80.4 kg)  12/10/23 179 lb 12.8 oz (81.6 kg)  07/25/23 173 lb (78.5 kg)    Vital signs reviewed  05/25/2024  - Note at rest 02 sats  ***% on ***   General appearance:    ***  Prominent pw***             Assessment

## 2024-05-25 ENCOUNTER — Ambulatory Visit: Admitting: Internal Medicine

## 2024-05-25 ENCOUNTER — Encounter: Payer: Self-pay | Admitting: Internal Medicine

## 2024-05-26 ENCOUNTER — Telehealth (HOSPITAL_COMMUNITY): Payer: Self-pay

## 2024-05-26 NOTE — Telephone Encounter (Signed)
 front office called pt to schedule a CPX test ordered by Dr. Nishan and pt did not answer the telephone call - - front office personnel left pt a voice mail message instructing pt to call back to get scheduled.

## 2024-05-28 ENCOUNTER — Ambulatory Visit (HOSPITAL_COMMUNITY): Admitting: Psychiatry

## 2024-06-02 ENCOUNTER — Other Ambulatory Visit (HOSPITAL_COMMUNITY): Payer: Self-pay

## 2024-06-02 NOTE — Telephone Encounter (Signed)
 Pharmacy Patient Advocate Encounter  Received notification from [employer] that Prior Authorization for LEVETIRACETAM  1000MG  has been CANCELLED due to   PA #/Case ID/Reference #: [id number]

## 2024-06-16 ENCOUNTER — Encounter (INDEPENDENT_AMBULATORY_CARE_PROVIDER_SITE_OTHER): Payer: Self-pay | Admitting: Otolaryngology

## 2024-06-16 ENCOUNTER — Ambulatory Visit (INDEPENDENT_AMBULATORY_CARE_PROVIDER_SITE_OTHER): Admitting: Otolaryngology

## 2024-06-16 VITALS — BP 142/99 | HR 123

## 2024-06-16 DIAGNOSIS — R0982 Postnasal drip: Secondary | ICD-10-CM

## 2024-06-16 DIAGNOSIS — R0600 Dyspnea, unspecified: Secondary | ICD-10-CM

## 2024-06-16 DIAGNOSIS — F17210 Nicotine dependence, cigarettes, uncomplicated: Secondary | ICD-10-CM

## 2024-06-16 DIAGNOSIS — R0981 Nasal congestion: Secondary | ICD-10-CM

## 2024-06-16 DIAGNOSIS — R490 Dysphonia: Secondary | ICD-10-CM

## 2024-06-16 DIAGNOSIS — J3089 Other allergic rhinitis: Secondary | ICD-10-CM

## 2024-06-16 DIAGNOSIS — Z720 Tobacco use: Secondary | ICD-10-CM

## 2024-06-16 MED ORDER — FLUTICASONE PROPIONATE 50 MCG/ACT NA SUSP: 2.0000 | Freq: Every day | NASAL | 6 refills | Status: AC

## 2024-06-16 MED ORDER — LEVOCETIRIZINE DIHYDROCHLORIDE 5 MG PO TABS: 5.0000 mg | ORAL_TABLET | Freq: Every evening | ORAL | 3 refills | Status: DC

## 2024-06-16 NOTE — Progress Notes (Signed)
 ENT CONSULT:  Reason for Consult: concern for upper airway obstruction based on PFTs need to rule out SGS    HPI: Discussed the use of AI scribe software for clinical note transcription with the patient, who gave verbal consent to proceed.  History of Present Illness Ricky Werner is a 58 year old male current smoker, trying to cut down, who presents for evaluation of potential airway narrowing following pulmonary function test findings concerning for SGS. He was referred by his pulmonary doctor for further evaluation.  He is a current smoker experiencing shortness of breath with exertion, such as walking short distances. He has not been using inhalers and has not been tested for allergies to dust or pollen. He describes chronic nasal congestion and post-nasal drainage, which he manages by blowing his nose, and he is a mouth breather due to nasal congestion.  He recalls undergoing an x-ray as part of his workup for COPD.   He has hoarseness most of the time. He denies trouble with swallowing.      Records Reviewed:  Office visit with Dr Darlean 02/19/24 58 yowm  active smoker  referred to pulmonary clinic in [place]  07/25/2023 by Dr Bertell  for progressive doe x 2021.      History of Present Illness  07/25/2023  Pulmonary/ 1st office eval/ [facility] / [place] [facility]     Chief Complaint  Patient presents with   Establish Care   Shortness of Breath  Dyspnea:  maybe one or two aisles and out of breath.  Cough: dry raspy with choking sensation  Sleep: on couch with a pillow due to vertigo but can't sleep due to choking spells  SABA use: never used  02: none  Lung cancer screen:  referred  Rec Stop  lisinopril  and start olmesartan  20 mg  twice daily - take only once daily if too strong  Please schedule a follow up office visit in 6 weeks, call sooner if needed    12/13/2023  f/u ov/ office/[facility] re: ? Ace case /emphysema on LDSCT  maint on no resp meds       Chief Complaint   Patient presents with   Follow-up      Breathing is unchanged. Denies new co's.   Dyspnea:  no better =  50 ft (not reproduced in clinic) but bothered also by severe vertigo and tinnitis  Cough: raspy assoc with hoarseness and nasal obst bilaterally x years p broke nose s ent eval yet  Sleeping: flat and no longer having choking spells or    resp cc  SABA use: none  02: none  Lung cancer screening: 11/29/23  rec Proceed with CPST  when available  My office will be contacting you by phone for referral to [facility] and [facility] at [facility]   If we see a problem on your PFTs or CPST indicating a pulmonary issue, we will schedule you back here    If you stop smoking now, your lungs should be fine and we'll see you back as needed    Late add: - PFT's  01/22/23  FEV1 2.70 (85 % ) ratio 0.74  p 0 % improvement from saba p 0 prior to study with DLCO  19.7 (80%)   and FV curve abnormal insp> exp  suggesting upper airway obstruction with no expiratory issues so not suggestive of a lung problem > ent eval next step      Past Medical History:  Diagnosis Date   Alcohol  abuse  Anxiety    BPH (benign prostatic hyperplasia)    Carpal tunnel syndrome on right    Chronic back pain    Depression    History of dental problems    Hypertension    Osteoarthritis    Panic attacks    Right tennis elbow    Sciatica    Seizures (HCC) 09/30/2020   Vertigo     Past Surgical History:  Procedure Laterality Date   KIDNEY STONE SURGERY      Family History  Problem Relation Age of Onset   Heart failure Mother    Hypertension Mother    Hyperlipidemia Mother    Diabetes Mother    Breast cancer Mother    Stroke Father    Hypertension Father    Hypertension Brother     Social History:  reports that he has been smoking cigarettes. He has a 39 pack-year smoking history. He has been exposed to tobacco smoke. He has never used smokeless tobacco. He reports current alcohol  use of about 6.0 standard drinks  of alcohol  per week. He reports current drug use. Drugs: Marijuana, Cocaine, and Fentanyl .  Allergies: No Known Allergies  Medications: I have reviewed the patient's current medications.  The PMH, PSH, Medications, Allergies, and SH were reviewed and updated.  ROS: Constitutional: Negative for fever, weight loss and weight gain. Cardiovascular: Negative for chest pain and dyspnea on exertion. Respiratory: Is not experiencing shortness of breath at rest. Gastrointestinal: Negative for nausea and vomiting. Neurological: Negative for headaches. Psychiatric: The patient is not nervous/anxious  Blood pressure (!) 142/99, pulse (!) 123, SpO2 95%. There is no height or weight on file to calculate BMI.  PHYSICAL EXAM:  Exam: General: Well-developed, well-nourished Communication and Voice: mildly hoarse  Respiratory Respiratory effort: Equal inspiration and expiration without stridor Cardiovascular Peripheral Vascular: Warm extremities with equal color/perfusion Eyes: No nystagmus with equal extraocular motion bilaterally Neuro/Psych/Balance: Patient oriented to person, place, and time; Appropriate mood and affect; Gait is intact with no imbalance; Cranial nerves I-XII are intact Head and Face Inspection: Normocephalic and atraumatic without mass or lesion Palpation: Facial skeleton intact without bony stepoffs Salivary Glands: No mass or tenderness Facial Strength: Facial motility symmetric and full bilaterally ENT Pinna: External ear intact and fully developed External canal: Canal is patent with intact skin Tympanic Membrane: Clear and mobile External Nose: No scar or anatomic deformity Internal Nose: Septum is relatively straight on the left. No polyp, or purulence. Mucosal edema and erythema present.  Bilateral inferior turbinate hypertrophy.  Lips, Teeth, and gums: Mucosa and teeth intact and viable TMJ: No pain to palpation with full mobility Oral cavity/oropharynx: No  erythema or exudate, no lesions present Nasopharynx: No mass or lesion with intact mucosa Hypopharynx: Intact mucosa without pooling of secretions Larynx Glottic: Full true vocal cord mobility without lesion or mass Minimal edema likely Rainke's changes Supraglottic: Normal appearing epiglottis and AE folds Interarytenoid Space: Moderate pachydermia&edema Subglottic Space: Patent without lesion or edema Neck Neck and Trachea: Midline trachea without mass or lesion Thyroid: No mass or nodularity Lymphatics: No lymphadenopathy  Procedure: Preoperative diagnosis: hoarseness, dyspnea, hx of smoking   Postoperative diagnosis:   Same  Procedure: Flexible fiberoptic laryngoscopy  Surgeon: Elena Larry, MD  Anesthesia: Topical lidocaine  and Afrin Complications: None Condition is stable throughout exam  Indications and consent:  The patient presents to the clinic with above symptoms. Indirect laryngoscopy view was incomplete. Thus it was recommended that they undergo a flexible fiberoptic laryngoscopy. All of the risks,  benefits, and potential complications were reviewed with the patient preoperatively and verbal informed consent was obtained.  Procedure: The patient was seated upright in the clinic. Topical lidocaine  and Afrin were applied to the nasal cavity. After adequate anesthesia had occurred, I then proceeded to pass the flexible telescope into the nasal cavity. The nasal cavity was patent without rhinorrhea or polyp. The nasopharynx was also patent without mass or lesion. The base of tongue was visualized and was normal. There were no signs of pooling of secretions in the piriform sinuses. The true vocal folds were mobile bilaterally. There were no signs of glottic or supraglottic mucosal lesion or mass. There was moderate interarytenoid pachydermia and post cricoid edema. The telescope was then slowly withdrawn and the patient tolerated the procedure throughout.      Studies  Reviewed: CXR 03/27/24 IMPRESSION: 1. Bibasilar collapse/consolidation, mildly progressive on the right and similar on the left. 2. Probable small bilateral pleural effusions.  Assessment/Plan: Encounter Diagnoses  Name Primary?   Tobacco abuse    Dyspnea, unspecified type Yes   Chronic nasal congestion    Environmental and seasonal allergies    Post-nasal drip    Hoarseness     Assessment and Plan Assessment & Plan Dyspnea primarily with mild exertion CXR with pleural effusion on 03/27/24. Seen by Dr Darlean with PFTs concerning for SGS. Flexible scope exam with patent airway without subglottic or tracheal stenosis. Minimal Reinke's changes along VF likely responsible for hoarseness, no lesions noted.  - will let Dr Darlean know - patient was instructed to see Dr Darlean for further management of dyspnea    Chronic Nasal congestion and post-nasal drainage Nasal congestion and drainage likely exacerbated by smoking, but could also be related to environmental allergies  - Recommend nasal saline rinses. - Prescribed Flonase  BID. - Prescribed Xyzal  5 mg QHS  Tobacco use disorder.  We had an extensive discussion about detrimental effects of smoking on overall health. I provided resources available at [facility] to assist with smoking cessation. I spent 4 min on counseling  - Advised smoking cessation   Thank you for allowing me to participate in the care of this patient. Please do not hesitate to contact me with any questions or concerns.   Elena Larry, MD [facility] Phone: [phone] Fax: [phone]    06/16/2024, 11:28 AM

## 2024-06-16 NOTE — Patient Instructions (Addendum)
 Aureliano Med Nasal Saline Rinse   - start nasal saline rinses with NeilMed Bottle available over the counter or online to help with nasal congestion     Dear Ricky Werner,   Congratulations for your interest in quitting smoking!  Find a program that suits you best: when you want to quit, how you need support, where you live, and how you like to learn.    If you're ready to get started TODAY, consider scheduling a visit through [email or web address].  Appointments are available from 8am to 8pm, Monday to Friday.   Most health insurance plans will cover some level of tobacco cessation visits and medications.    Additional Resources: [employer] are also available to help you quit & provide the support you'll need. Many programs are available in both [place] and Spanish and have a long history of successfully helping people get off and stay off tobacco.    Quit Smoking Apps:  quitSTART at SeriousBroker.de QuitGuide?at ForgetParking.dk Online education and resources: Smokefree  at [employer].gov Free Telephone Coaching: QuitNow,  Call 1-800-QUIT-NOW (605-470-0980) or Text- Ready to (602)097-9054 *Quitline [name] has teamed up with Medicaid to offer a free 14 week program    Vaping- Want to Quit? Free 24/7 support. Call [facility]  [place], [place], [place], [place], [place]  [facility]

## 2024-06-17 ENCOUNTER — Encounter: Payer: Self-pay | Admitting: Internal Medicine

## 2024-06-17 ENCOUNTER — Ambulatory Visit: Admitting: Internal Medicine

## 2024-06-17 VITALS — BP 143/96 | HR 118 | Ht 66.0 in | Wt 176.4 lb

## 2024-06-17 DIAGNOSIS — F17210 Nicotine dependence, cigarettes, uncomplicated: Secondary | ICD-10-CM | POA: Diagnosis not present

## 2024-06-17 DIAGNOSIS — R0609 Other forms of dyspnea: Secondary | ICD-10-CM | POA: Diagnosis not present

## 2024-06-17 DIAGNOSIS — J310 Chronic rhinitis: Secondary | ICD-10-CM

## 2024-06-17 MED ORDER — FAMOTIDINE 20 MG PO TABS: ORAL_TABLET | ORAL | 11 refills | Status: AC

## 2024-06-17 MED ORDER — PANTOPRAZOLE SODIUM 40 MG PO TBEC: 40.0000 mg | DELAYED_RELEASE_TABLET | Freq: Every day | ORAL | 2 refills | Status: DC

## 2024-06-17 NOTE — Patient Instructions (Addendum)
 Follow ENT instructions and follow up with Dr Soldatova if can't stop clearing your throat.  Add:  Pantoprazole  (protonix ) 40 mg   Take  30-60 min before first meal of the day and Pepcid  (famotidine )  20 mg after supper until return to office - this is the best way to tell whether stomach acid is contributing to your problem.     GERD (REFLUX)  is an extremely common cause of respiratory symptoms just like yours , many times with no obvious heartburn at all.    It can be treated with medication, but also with lifestyle changes including elevation of the head of your bed (ideally with 6 -8inch blocks under the headboard of your bed),  Smoking cessation, avoidance of late meals, excessive alcohol , and avoid fatty foods, chocolate, peppermint, colas, red wine, and acidic juices such as orange juice.  NO MINT OR MENTHOL PRODUCTS SO NO COUGH DROPS (Luden's PECTIN is ok)  USE SUGARLESS CANDY INSTEAD (Jolley ranchers or Stover's or Life Savers) or even ice chips will also do - the key is to swallow to prevent all throat clearing. NO OIL BASED VITAMINS - use powdered substitutes.  Avoid fish oil when coughing.    The key is to stop smoking completely before smoking completely stops you!   Take all your usual medicines before your visit with DR Bertell and take them with you   Pulmonary follow up is as needed

## 2024-06-17 NOTE — Progress Notes (Unsigned)
 Ricky Werner, male    DOB: 26-Oct-1966    MRN: [id number]   Brief patient profile:  54 yowm  active smoker  referred to pulmonary clinic in [place]  07/25/2023 by Dr Bertell  for progressive doe x 2021.    History of Present Illness  07/25/2023  Pulmonary/ 1st office eval/ [name] / [place] [facility]  Chief Complaint  Patient presents with   Establish Care   Shortness of Breath  Dyspnea:  maybe one or two aisles and out of breath.  Cough: dry raspy with choking sensation  Sleep: on couch with a pillow due to vertigo but can't sleep due to choking spells  SABA use: never used  02: none  Lung cancer screen:  referred  Rec Stop  lisinopril  and start olmesartan  20 mg  twice daily - take only once daily if too strong  Please schedule a follow up office visit in 6 weeks, call sooner if needed     12/13/2023  f/u ov/[name] office/[name] re: ? Ace case /emphysema on LDSCT  maint on no resp meds   Chief Complaint  Patient presents with   Follow-up    Breathing is unchanged. Denies new co's.   Dyspnea:  no better =  50 ft (not reproduced in clinic) but bothered also by severe vertigo and tinnitis  Cough: raspy assoc with hoarseness and nasal obst bilaterally x years p broke nose s ent eval yet  Sleeping: flat and no longer having choking spells or    resp cc  SABA use: none  02: none  Lung cancer screening: 11/29/23 rec Proceed with CPST  when available  My office will be contacting you by phone for referral to [facility] and [facility] at [facility]   If we see a problem on your PFTs or CPST indicating a pulmonary issue, we will schedule you back here   If you stop smoking now, your lungs should be fine and we'll see you back as needed   Late add: - PFT's  01/22/23  FEV1 2.70 (85 % ) ratio 0.74  p 0 % improvement from saba p 0 prior to study with DLCO  19.7 (80%)   and FV curve abnormal insp> exp  suggesting upper airway obstruction with no expiratory issues so not suggestive of a lung  problem > ent eval next step    02/19/2024  f/u ov/[place] office/[name] re: doe/ nasal obstruction/cough    maint on no resp rx/ still smoking  Chief Complaint  Patient presents with   Follow-up    Follow up for DOE , has gotten worse b/c of pollen   Dyspnea:  mainly due to cough  Cough: mildly congested sounding / no purulent sputum / much worse since pollen started up  Sleeping: on couch arm x  one pillow freq awakening from cough    resp cc  SABA use: none  02: none  Rec Be sure to reschedule your ENT evaluation asap - the only abnormality your have on your breathing test is your throat function and a long of your cough may be coming from your sinuses  Take delsym two tsp every 12 hours and supplement if needed with  Tylenol  #3   up to 1-2 every 4 hours  Once you have eliminated the cough for 3 straight days try reducing the Tylenol  #3 first,  then the delsym as tolerated.    Depomedrol 120 mg IM  The key is to stop smoking completely before smoking completely  stops you! 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    Also denies any obvious fluctuation of symptoms with weather or environmental changes or other aggravating or alleviating factors except as outlined above   No unusual  exposure hx or h/o childhood pna/ asthma or knowledge of premature birth.  Current Allergies, Complete Past Medical History, Past Surgical History, Family History, and Social History were reviewed in [name].  ROS  The following are not active complaints unless bolded Hoarseness, sore throat, dysphagia, dental problems, itching, sneezing,  nasal congestion or discharge of excess mucus or purulent secretions, ear ache,   fever, chills, sweats, unintended wt loss or wt gain, classically pleuritic or exertional cp,  orthopnea pnd or arm/hand swelling  or leg swelling, presyncope, palpitations, abdominal pain, anorexia, nausea, vomiting, diarrhea  or change in bowel habits or change in bladder habits, change in stools or change in urine, dysuria, hematuria,  rash, arthralgias, visual complaints, headache, numbness, weakness or ataxia or problems with walking or coordination,  change in mood or  memory.        Current Meds - - NOTE:   Unable to verify as accurately reflecting what pt takes    Medication Sig   alprazolam  (XANAX ) 2 MG tablet Take 0.5 tablets (1 mg total) by mouth 3 (three) times daily.   amLODipine  (NORVASC ) 5 MG tablet Take 1 tablet (5 mg total) by mouth daily.   aspirin  EC 81 MG tablet Take 81 mg by mouth daily.   cloNIDine  (CATAPRES ) 0.1 MG tablet Take 1 tablet (0.1 mg total) by mouth 3 (three) times daily.   cyanocobalamin  1000 MCG tablet Take 1 tablet (1,000 mcg total) by mouth daily.   escitalopram  (LEXAPRO ) 10 MG tablet Take 1 tablet by mouth daily.   famotidine  (PEPCID ) 20 MG tablet One after supper   fluticasone  (FLONASE ) 50 MCG/ACT nasal spray Place 2 sprays into both nostrils daily.   folic acid  (FOLVITE ) 1 MG tablet Take 1 tablet (1 mg total) by mouth daily.   Hyprom-Naphaz-Polysorb-Zn Sulf (CLEAR EYES COMPLETE OP) Place 1 drop into both eyes daily as needed (dry eyes/irritation).   levETIRAcetam  (KEPPRA ) 1000 MG tablet Take 1 tablet (1,000  mg total) by mouth 2 (two) times daily.   levocetirizine (XYZAL  ALLERGY 24HR) 5 MG tablet Take 1 tablet (5 mg total) by mouth every evening.   methocarbamol  (ROBAXIN ) 500 MG tablet Take 1 tablet (500 mg total) by mouth every 8 (eight) hours as needed for muscle spasms.   Multiple Vitamin (MULTIVITAMIN WITH MINERALS) TABS tablet Take 1 tablet by mouth daily.   naproxen  (NAPROSYN ) 500 MG tablet Take 1 tablet (  500 mg total) by mouth 2 (two) times daily.   olmesartan  (BENICAR ) 20 MG tablet Take 20 mg by mouth daily.   pantoprazole  (PROTONIX ) 40 MG tablet Take 1 tablet (40 mg total) by mouth daily. Take 30-60 min before first meal of the day   QUEtiapine  (SEROQUEL ) 50 MG tablet Take 1 tablet (50 mg total) by mouth 2 (two) times daily.                Past Medical History:  Diagnosis Date   Alcohol  abuse    Anxiety    BPH (benign prostatic hyperplasia)    Carpal tunnel syndrome on right    Chronic back pain    Depression    Headache    History of dental problems    Hypertension    Osteoarthritis    Panic attacks    Right tennis elbow    Sciatica    Seizures (HCC) 09/30/2020   Vertigo       Objective:    Wts  06/17/2024          ***  02/19/2024        180  12/13/23 177 lb 3.2 oz (80.4 kg)  12/10/23 179 lb 12.8 oz (81.6 kg)  07/25/23 173 lb (78.5 kg)    Vital signs reviewed  06/17/2024  - Note at rest 02 sats  ***% on ***   General appearance:    walks with cane/ freq tc, clear lungs   Prominent pw***             Assessment

## 2024-06-18 NOTE — Assessment & Plan Note (Addendum)
 Counseled re importance of smoking cessation but did not meet time criteria for separate billing     Rec keep up with yearly LDSCT q January/ f/u here prn with f/u with PCP in meantime with all meds in hand and take his bp meds before the visit so Dr Bertell can see if they are working properly    Each maintenance medication was reviewed in detail including emphasizing most importantly the difference between maintenance and prns and under what circumstances the prns are to be triggered using an action plan format where appropriate.  Total time for H and P, chart review, counseling, reviewing nasal devices device(s) , directly observing portions of ambulatory 02 saturation study/ and generating customized AVS unique to this office visit / same day charting = 35 min summary final f/u ov

## 2024-06-18 NOTE — Assessment & Plan Note (Addendum)
 Referred to [facility] ent 12/13/2023   - seen by Soldatova 06/16/24 c/w CR/ reflux but VC's are mobile, no masses   Reviewed ENT's consult note line by line with pt including recs for flonase  and xyzal  added. Also emphasized ways to avoid throat clearing which tends to generate cyclical cough >>>   see avs for instructions unique to this ov   No f/u here planned for this problem but assured him that stopping smoking would make more difference than anything we can offer here but that this is in his hands

## 2024-06-18 NOTE — Assessment & Plan Note (Signed)
 Active smoker - onset 2021 > progressed to 50 ft by time of 1st pulmonary ov 07/25/2023  - 07/25/2023   Walked on RA  x  3  lap(s) =  approx 450  ft  @ slow to mod  pace, stopped due to end of study with lowest 02 sats 98% but unsteady on feet  - 07/25/2023 try off acei > no better 12/13/2023  - 12/13/2023   Walked on RA   x  3  lap(s) =  approx 450  ft  @ avg/ cane pace, stopped due to average pace with cane right hand/stopped x 2 to rest due to vertigo and leg fatigue/no SOB/lmr  with lowest 02 sats 98%   - PFT's  01/22/23  FEV1 2.70 (85 % ) ratio 0.74  p 0 % improvement from saba p 0 prior to study with DLCO  19.7 (80%)   and FV curve abnormal insp> exp    - 06/17/2024   Walked on RA  x  1  lap(s) =  approx 150  ft  @ slow/cane pace, stopped due to leg pain and sob with lowest 02 sats 97%    He does not yet have copd but clearly quite debilitated for age 58 with deconditioning related to orthopedic but despite active smoking his only resp issues are upper airway so no regular f/u needed in this clinic

## 2024-06-29 ENCOUNTER — Encounter (HOSPITAL_COMMUNITY): Payer: Self-pay

## 2024-06-30 ENCOUNTER — Ambulatory Visit (HOSPITAL_COMMUNITY): Attending: Internal Medicine

## 2024-06-30 DIAGNOSIS — R0609 Other forms of dyspnea: Secondary | ICD-10-CM | POA: Diagnosis present

## 2024-06-30 DIAGNOSIS — J9601 Acute respiratory failure with hypoxia: Secondary | ICD-10-CM | POA: Insufficient documentation

## 2024-06-30 DIAGNOSIS — F17210 Nicotine dependence, cigarettes, uncomplicated: Secondary | ICD-10-CM | POA: Diagnosis not present

## 2024-07-03 ENCOUNTER — Ambulatory Visit: Payer: Self-pay | Admitting: Cardiovascular Disease

## 2024-07-03 DIAGNOSIS — R0600 Dyspnea, unspecified: Secondary | ICD-10-CM | POA: Diagnosis not present

## 2024-07-08 ENCOUNTER — Telehealth: Payer: Self-pay | Admitting: *Deleted

## 2024-07-08 NOTE — Telephone Encounter (Signed)
 Copied from CRM [id number]. Topic: Clinical - Lab/Test Results >> Jul 07, 2024  9:53 AM Rilla B wrote: Reason for CRM: Patient states he had a Cardiopulmonary test on 8/05 and received a message that the results were released to Dr Darlean. Patient would like a call to go over results. Please call patient @ [phone].  I called the pt and there was no answer- LMTCB.

## 2024-07-09 ENCOUNTER — Telehealth (INDEPENDENT_AMBULATORY_CARE_PROVIDER_SITE_OTHER): Payer: Self-pay

## 2024-07-09 NOTE — Telephone Encounter (Signed)
 PT ret call about the message Samatha left. I read msg but he still wants call back.

## 2024-07-09 NOTE — Telephone Encounter (Signed)
 The attorney's office called to follow up on the medical records request.  Please contact the office at [phone]

## 2024-07-09 NOTE — Telephone Encounter (Signed)
 Atc x1 to inform pt that his result might be in mychart but the finalized results will be forwarded when completed by interpreting physician.

## 2024-07-10 ENCOUNTER — Telehealth: Payer: Self-pay | Admitting: Otolaryngology

## 2024-07-10 NOTE — Telephone Encounter (Signed)
 Pt calling regarding him needing his results from his cariopulmonary exam for disbaility appt. I told him I do not see the results yet in his chart that we will let him know as soon as we receive them.

## 2024-07-10 NOTE — Telephone Encounter (Signed)
 [place] and [employer] office called our office and LVM which Tami K forwarded to me. They were inquiring about the status of the Medical Records request they sent. I faxed the request to [facility] HIM Dept on 07/02/2024.  I called [facility] HIM Dept and spoke with Garrel.  He stated that the request had been processed and an invoice was faxed this morning.  I attempted to call 3 different phone #s [phone], [phone] and [phone] to relay that message and was unable to speak with someone or leave a message.

## 2024-07-16 ENCOUNTER — Telehealth (INDEPENDENT_AMBULATORY_CARE_PROVIDER_SITE_OTHER): Payer: Self-pay | Admitting: Otolaryngology

## 2024-07-16 NOTE — Telephone Encounter (Signed)
 Received message about follow up of medical records request.  Called [facility] and spoke with [facility].  I stated that I contacted our [facility] HIM Dept and was told that Invoice # [phone] was faxed on 07/10/2024 for $6.00.  Once that amount is paid, the records requested will be released.  I provided the [facility] HIM Dept phone #.

## 2024-07-25 ENCOUNTER — Encounter (INDEPENDENT_AMBULATORY_CARE_PROVIDER_SITE_OTHER): Payer: Self-pay

## 2024-08-27 ENCOUNTER — Encounter (HOSPITAL_COMMUNITY): Payer: Self-pay | Admitting: Psychiatry

## 2024-08-27 ENCOUNTER — Ambulatory Visit (INDEPENDENT_AMBULATORY_CARE_PROVIDER_SITE_OTHER): Admitting: Psychiatry

## 2024-08-27 DIAGNOSIS — F411 Generalized anxiety disorder: Secondary | ICD-10-CM | POA: Diagnosis not present

## 2024-08-27 DIAGNOSIS — F321 Major depressive disorder, single episode, moderate: Secondary | ICD-10-CM | POA: Diagnosis not present
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
## 2024-08-28 ENCOUNTER — Other Ambulatory Visit: Payer: Self-pay

## 2024-08-28 MED ORDER — OLMESARTAN MEDOXOMIL 20 MG PO TABS: 20.0000 mg | ORAL_TABLET | Freq: Every day | ORAL | 5 refills | Status: AC

## 2024-08-28 NOTE — Telephone Encounter (Signed)
 Patient requesting refill for olmesartan  medoxomil 20 mg.  Please advise.  Thank you.

## 2024-10-14 ENCOUNTER — Encounter: Payer: Self-pay | Admitting: Acute Care

## 2024-10-21 ENCOUNTER — Other Ambulatory Visit: Payer: Self-pay | Admitting: Internal Medicine

## 2024-10-26 ENCOUNTER — Encounter (INDEPENDENT_AMBULATORY_CARE_PROVIDER_SITE_OTHER): Payer: Self-pay | Admitting: *Deleted

## 2024-10-26 ENCOUNTER — Encounter: Payer: Self-pay | Admitting: Family Medicine

## 2024-10-26 ENCOUNTER — Ambulatory Visit: Admitting: Family Medicine

## 2024-10-26 VITALS — BP 90/64 | HR 109 | Temp 98.1°F | Ht 66.0 in | Wt 177.4 lb

## 2024-10-26 DIAGNOSIS — H906 Mixed conductive and sensorineural hearing loss, bilateral: Secondary | ICD-10-CM | POA: Insufficient documentation

## 2024-10-26 DIAGNOSIS — Z7689 Persons encountering health services in other specified circumstances: Secondary | ICD-10-CM

## 2024-10-26 DIAGNOSIS — F411 Generalized anxiety disorder: Secondary | ICD-10-CM

## 2024-10-26 DIAGNOSIS — Z23 Encounter for immunization: Secondary | ICD-10-CM

## 2024-10-26 DIAGNOSIS — G8929 Other chronic pain: Secondary | ICD-10-CM

## 2024-10-26 DIAGNOSIS — F1320 Sedative, hypnotic or anxiolytic dependence, uncomplicated: Secondary | ICD-10-CM

## 2024-10-26 DIAGNOSIS — Z720 Tobacco use: Secondary | ICD-10-CM

## 2024-10-26 DIAGNOSIS — Z1211 Encounter for screening for malignant neoplasm of colon: Secondary | ICD-10-CM

## 2024-10-26 DIAGNOSIS — I10 Essential (primary) hypertension: Secondary | ICD-10-CM

## 2024-10-26 LAB — CBC WITH DIFFERENTIAL/PLATELET
Absolute Lymphocytes: 3169 {cells}/uL (ref 850–3900)
Absolute Monocytes: 806 {cells}/uL (ref 200–950)
Basophils Absolute: 56 {cells}/uL (ref 0–200)
Basophils Relative: 0.4 %
Eosinophils Absolute: 83 {cells}/uL (ref 15–500)
Eosinophils Relative: 0.6 %
HCT: 42 % (ref 39.4–51.1)
Hemoglobin: 14 g/dL (ref 13.2–17.1)
MCH: 30.1 pg (ref 27.0–33.0)
MCHC: 33.3 g/dL (ref 31.6–35.4)
MCV: 90.3 fL (ref 81.4–101.7)
MPV: 10.1 fL (ref 7.5–12.5)
Monocytes Relative: 5.8 %
Neutro Abs: 9786 {cells}/uL — ABNORMAL HIGH (ref 1500–7800)
Neutrophils Relative %: 70.4 %
Platelets: 388 Thousand/uL (ref 140–400)
RBC: 4.65 Million/uL (ref 4.20–5.80)
RDW: 12.4 % (ref 11.0–15.0)
Total Lymphocyte: 22.8 %
WBC: 13.9 Thousand/uL — ABNORMAL HIGH (ref 3.8–10.8)

## 2024-10-26 LAB — COMPREHENSIVE METABOLIC PANEL WITH GFR
AG Ratio: 1.4 (calc) (ref 1.0–2.5)
ALT: 46 U/L (ref 9–46)
AST: 26 U/L (ref 10–35)
Albumin: 4.2 g/dL (ref 3.6–5.1)
Alkaline phosphatase (APISO): 44 U/L (ref 35–144)
BUN: 17 mg/dL (ref 7–25)
CO2: 25 mmol/L (ref 20–32)
Calcium: 9.4 mg/dL (ref 8.6–10.3)
Chloride: 101 mmol/L (ref 98–110)
Creat: 1.02 mg/dL (ref 0.70–1.35)
Globulin: 2.9 g/dL (ref 1.9–3.7)
Glucose, Bld: 101 mg/dL — ABNORMAL HIGH (ref 65–99)
Potassium: 3.8 mmol/L (ref 3.5–5.3)
Sodium: 137 mmol/L (ref 135–146)
Total Bilirubin: 0.3 mg/dL (ref 0.2–1.2)
Total Protein: 7.1 g/dL (ref 6.1–8.1)
eGFR: 84 mL/min/1.73m2 (ref >=60)

## 2024-10-26 LAB — LIPID PANEL
Cholesterol: 168 mg/dL (ref <200)
HDL: 33 mg/dL — ABNORMAL LOW (ref >=40)
LDL Cholesterol (Calc): 111 mg/dL — ABNORMAL HIGH
Non-HDL Cholesterol (Calc): 135 mg/dL — ABNORMAL HIGH (ref <130)
Total CHOL/HDL Ratio: 5.1 (calc) — ABNORMAL HIGH (ref <5.0)
Triglycerides: 126 mg/dL (ref <150)

## 2024-10-26 LAB — VITAMIN D 25 HYDROXY (VIT D DEFICIENCY, FRACTURES): Vit D, 25-Hydroxy: 24 ng/mL — ABNORMAL LOW (ref 30–100)

## 2024-10-26 LAB — TSH: TSH: 1.48 m[IU]/L (ref 0.40–4.50)

## 2024-10-26 MED ORDER — NAPROXEN 500 MG PO TABS: 500.0000 mg | ORAL_TABLET | Freq: Two times a day (BID) | ORAL | 1 refills | Status: AC

## 2024-10-26 MED ORDER — METHOCARBAMOL 500 MG PO TABS: 500.0000 mg | ORAL_TABLET | Freq: Two times a day (BID) | ORAL | 1 refills | Status: AC

## 2024-10-26 MED ORDER — ALPRAZOLAM 1 MG PO TABS: 1.0000 mg | ORAL_TABLET | Freq: Two times a day (BID) | ORAL | 0 refills | Status: DC | PRN

## 2024-10-26 MED ORDER — DULOXETINE HCL 30 MG PO CPEP: 30.0000 mg | ORAL_CAPSULE | Freq: Every day | ORAL | 1 refills | Status: AC
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
## 2024-10-27 ENCOUNTER — Ambulatory Visit: Payer: Self-pay | Admitting: Family Medicine

## 2024-11-02 ENCOUNTER — Other Ambulatory Visit (INDEPENDENT_AMBULATORY_CARE_PROVIDER_SITE_OTHER): Payer: Self-pay | Admitting: Otolaryngology

## 2024-11-02 ENCOUNTER — Telehealth (HOSPITAL_COMMUNITY): Payer: Self-pay | Admitting: *Deleted

## 2024-11-02 NOTE — Telephone Encounter (Signed)
 Patient called stating he would like to cancel all of his appt with provider. Per pt he was referred to provider to get a mental health evaluation for him to get his disability and now he has it, he do not need to see provider anymore and for staff to cancel all 4 of his appts with provider. Per pt he's already on disability now.

## 2024-11-04 ENCOUNTER — Ambulatory Visit (HOSPITAL_COMMUNITY): Admitting: Psychiatry

## 2024-11-17 ENCOUNTER — Ambulatory Visit (INDEPENDENT_AMBULATORY_CARE_PROVIDER_SITE_OTHER): Admitting: Gastroenterology

## 2024-11-24 ENCOUNTER — Ambulatory Visit (HOSPITAL_COMMUNITY): Admitting: Psychiatry

## 2024-11-27 ENCOUNTER — Ambulatory Visit: Admitting: Family Medicine

## 2024-11-30 ENCOUNTER — Ambulatory Visit: Admitting: Family Medicine

## 2024-12-02 ENCOUNTER — Other Ambulatory Visit: Payer: Self-pay | Admitting: Family Medicine

## 2024-12-02 ENCOUNTER — Other Ambulatory Visit: Payer: Self-pay

## 2024-12-02 DIAGNOSIS — F1320 Sedative, hypnotic or anxiolytic dependence, uncomplicated: Secondary | ICD-10-CM

## 2024-12-02 MED ORDER — ALPRAZOLAM 1 MG PO TABS: 1.5000 mg | ORAL_TABLET | Freq: Every day | ORAL | 0 refills | Status: AC

## 2024-12-08 ENCOUNTER — Ambulatory Visit (HOSPITAL_COMMUNITY): Admitting: Psychiatry

## 2024-12-08 ENCOUNTER — Ambulatory Visit (HOSPITAL_BASED_OUTPATIENT_CLINIC_OR_DEPARTMENT_OTHER)

## 2024-12-09 ENCOUNTER — Ambulatory Visit (HOSPITAL_COMMUNITY): Admission: RE | Admit: 2024-12-09 | Source: Ambulatory Visit

## 2024-12-09 ENCOUNTER — Ambulatory Visit (HOSPITAL_BASED_OUTPATIENT_CLINIC_OR_DEPARTMENT_OTHER)

## 2024-12-22 ENCOUNTER — Ambulatory Visit (HOSPITAL_COMMUNITY): Admitting: Psychiatry

## 2024-12-29 ENCOUNTER — Other Ambulatory Visit: Payer: Self-pay | Admitting: Acute Care

## 2024-12-29 DIAGNOSIS — Z122 Encounter for screening for malignant neoplasm of respiratory organs: Secondary | ICD-10-CM

## 2024-12-29 DIAGNOSIS — Z87891 Personal history of nicotine dependence: Secondary | ICD-10-CM

## 2024-12-29 DIAGNOSIS — F17210 Nicotine dependence, cigarettes, uncomplicated: Secondary | ICD-10-CM

## 2025-01-01 ENCOUNTER — Other Ambulatory Visit: Payer: Self-pay | Admitting: Family Medicine

## 2025-01-01 ENCOUNTER — Ambulatory Visit: Payer: Self-pay

## 2025-01-01 NOTE — Telephone Encounter (Signed)
 FYI Only or Action Required?: FYI only for provider: appointment scheduled on 2/9.  Patient was last seen in primary care on 10/26/2024 by Aletha Bene, MD.  Called Nurse Triage reporting Migraine and Dizziness.  Symptoms began several weeks ago.  Interventions attempted: Prescription medications: muscle relaxers and Rest, hydration, or home remedies.  Symptoms are: stable.  Triage Disposition: See PCP Within 2 Weeks  Patient/caregiver understands and will follow disposition?: Yes  Message from [place] B sent at 01/01/2025 10:08 AM EST  Reason for Triage: extreme fatigue, starting to get migraines. Dizziness - would like to schedule a ov. Missed appt in Jan   Reason for Disposition  Headache is a chronic symptom (recurrent or ongoing AND present > 4 weeks)  Answer Assessment - Initial Assessment Questions Patient calling to reschedule his missed FU for HTN  HR 105, BP 120/80 but states his arm cuff at home reads different for each are and different every time. Denies CP, SOB, Fever.   Blood pressure causing Migraines to return- almost daily to above the right eye.  Eyes itching in the corners- burning sensation - has bifocals and messes with his vision. No new blurry or double vision.   Taking- 81mg  ASA and Methocarbamol  500mg , naproxen  500mg - Advil  Body aches- spasms - uses muscle relaxer to control- has caught himself from falling a few times- denies any injury  Follow up Monday 2/9. Understands ED precautions   Dr Bertell no longer practicing and [facility] is going send refill requests to see if Dr Wilfrid is able to take over prescribing.   1. LOCATION: Where does it hurt?      Right frontal over right eye  2. ONSET: When did the headache start? (e.g., minutes, hours, days)      2 weeks ago- every day  3. PATTERN: Does the pain come and go, or has it been constant since it started?     Coming on daily 4. SEVERITY: How bad is the pain? and What does it  keep you from doing?  (e.g., Scale 1-10; mild, moderate, or severe)     8/10 5. RECURRENT SYMPTOM: Have you ever had headaches before? If Yes, ask: When was the last time? and What happened that time?      Yes  6. CAUSE: What do you think is causing the headache?     BP control 7. MIGRAINE: Have you been diagnosed with migraine headaches? If Yes, ask: Is this headache similar?      Yes -cw migraine  8. HEAD INJURY: Has there been any recent injury to your head?      Denies  9. OTHER SYMPTOMS: Do you have any other symptoms? (e.g., fever, stiff neck, eye pain, sore throat, cold symptoms)     Tinnitus (chronic), dizziness (chronic)  Protocols used: Headache-A-AH

## 2025-01-04 ENCOUNTER — Ambulatory Visit: Admitting: Family Medicine

## 2025-01-11 ENCOUNTER — Ambulatory Visit (HOSPITAL_COMMUNITY)
